# Patient Record
Sex: Male | Born: 1955 | Race: White | Hispanic: No | Marital: Married | Smoking: Never smoker
Health system: Southern US, Community
[De-identification: ages and names within clinical notes are randomized; demographics above are authoritative.]

## PROBLEM LIST (undated history)

## (undated) DIAGNOSIS — E669 Obesity, unspecified: Secondary | ICD-10-CM

## (undated) DIAGNOSIS — G2581 Restless legs syndrome: Secondary | ICD-10-CM

## (undated) DIAGNOSIS — I10 Essential (primary) hypertension: Secondary | ICD-10-CM

## (undated) DIAGNOSIS — E785 Hyperlipidemia, unspecified: Secondary | ICD-10-CM

## (undated) DIAGNOSIS — N200 Calculus of kidney: Secondary | ICD-10-CM

## (undated) DIAGNOSIS — E119 Type 2 diabetes mellitus without complications: Secondary | ICD-10-CM

## (undated) DIAGNOSIS — Z972 Presence of dental prosthetic device (complete) (partial): Secondary | ICD-10-CM

## (undated) HISTORY — PX: TONSILLECTOMY AND ADENOIDECTOMY: SHX28

## (undated) HISTORY — DX: Essential (primary) hypertension: I10

## (undated) HISTORY — DX: Restless legs syndrome: G25.81

## (undated) HISTORY — PX: OTHER SURGICAL HISTORY: SHX169

## (undated) HISTORY — DX: Type 2 diabetes mellitus without complications: E11.9

## (undated) HISTORY — DX: Obesity, unspecified: E66.9

---

## 2005-02-10 ENCOUNTER — Emergency Department: Payer: Self-pay | Admitting: Emergency Medicine

## 2005-02-22 ENCOUNTER — Emergency Department: Payer: Self-pay | Admitting: Emergency Medicine

## 2005-02-24 ENCOUNTER — Emergency Department: Payer: Self-pay | Admitting: Emergency Medicine

## 2005-08-01 ENCOUNTER — Emergency Department: Payer: Self-pay | Admitting: Emergency Medicine

## 2005-08-13 ENCOUNTER — Emergency Department: Payer: Self-pay | Admitting: Emergency Medicine

## 2005-10-05 ENCOUNTER — Emergency Department: Payer: Self-pay | Admitting: Internal Medicine

## 2007-06-10 ENCOUNTER — Ambulatory Visit: Payer: Self-pay | Admitting: Cardiology

## 2007-06-30 ENCOUNTER — Encounter: Payer: Self-pay | Admitting: Cardiology

## 2007-06-30 ENCOUNTER — Ambulatory Visit: Payer: Self-pay

## 2007-08-15 ENCOUNTER — Emergency Department: Payer: Self-pay | Admitting: Emergency Medicine

## 2009-11-02 ENCOUNTER — Encounter: Payer: Self-pay | Admitting: Cardiovascular Disease

## 2010-04-11 ENCOUNTER — Encounter: Payer: Self-pay | Admitting: Cardiovascular Disease

## 2010-04-18 ENCOUNTER — Encounter: Payer: Self-pay | Admitting: Cardiovascular Disease

## 2010-04-18 ENCOUNTER — Ambulatory Visit (INDEPENDENT_AMBULATORY_CARE_PROVIDER_SITE_OTHER): Payer: BC Managed Care – PPO | Admitting: Cardiovascular Disease

## 2010-04-18 DIAGNOSIS — E119 Type 2 diabetes mellitus without complications: Secondary | ICD-10-CM

## 2010-04-18 DIAGNOSIS — Z0000 Encounter for general adult medical examination without abnormal findings: Secondary | ICD-10-CM

## 2010-04-18 DIAGNOSIS — I10 Essential (primary) hypertension: Secondary | ICD-10-CM

## 2010-04-18 DIAGNOSIS — R079 Chest pain, unspecified: Secondary | ICD-10-CM | POA: Insufficient documentation

## 2010-04-18 NOTE — Progress Notes (Signed)
   Patient ID: Robert Griffith, male    DOB: 04-Jul-1955, 55 y.o.   MRN: [id number]  HPI Comments: Robert Griffith is a very pleasant 55 year old gentleman, patient of Dr. Annabell Howells, who has had previous workup for shortness of breath in the past with a stress echo, has a strong family history, recently diagnosed diabetes, who presents for evaluation of chest pain.  He reports that in the past several weeks, he has had 2 episodes of chest pain. He was mediastinal, lasting at least 5 minutes. It was sharp in nature. It eased off on its own without intervention. The first episode, he was going into the doctor for treatment of his right shoulder. The second episode occurred while he was walking.   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 He does have some mild shortness of breath which he attributes to being out of shape.  EKG shows normal sinus rhythm with rate 62 beats per minute with no significant ST or T wave changes.     Review of Systems  Constitutional: Negative.   HENT: Negative.   Eyes: Negative.   Respiratory: Positive for shortness of breath.   Cardiovascular: Positive for chest pain.  Gastrointestinal: Negative.   Musculoskeletal: Negative.   Skin: Negative.   Neurological: Negative.   Hematological: Negative.   Psychiatric/Behavioral: Negative.   All other systems reviewed and are negative.   BP 110/76  Pulse 62  Ht 5\' 8"  (1.727 m)  Wt 253 lb (114.76 kg)  BMI 38.47 kg/m2   Physical Exam  Nursing note and vitals reviewed. Constitutional: He is oriented to person, place, and time. He appears well-developed and well-nourished.  HENT:  Head: Normocephalic.  Nose: Nose normal.  Mouth/Throat: Oropharynx is clear and moist.  Eyes: Conjunctivae are normal. Pupils are equal, round, and reactive to light.  Neck: Normal range of motion. Neck supple. No JVD present.  Cardiovascular: Normal rate, regular rhythm, S1 normal, S2 normal, normal  heart sounds and intact distal pulses.  Exam reveals no gallop and no friction rub.   No murmur heard. Pulmonary/Chest: Effort normal and breath sounds normal. No respiratory distress. He has no wheezes. He has no rales. He exhibits no tenderness.  Abdominal: Soft. Bowel sounds are normal. He exhibits no distension. There is no tenderness.  Musculoskeletal: Normal range of motion. He exhibits no edema and no tenderness.  Lymphadenopathy:    He has no cervical adenopathy.  Neurological: He is alert and oriented to person, place, and time. Coordination normal.  Skin: Skin is warm and dry. No rash noted. No erythema.  Psychiatric: He has a normal mood and affect. His behavior is normal. Judgment and thought content normal.           Assessment and Plan

## 2010-04-18 NOTE — Assessment & Plan Note (Addendum)
His chest pain has some typical as well as atypical features. He does have several risk factors for coronary artery disease. We will set him up for a routine treadmill study at his convenience. Rest and contact our office if he has worsening chest pain symptoms.

## 2010-04-18 NOTE — Assessment & Plan Note (Signed)
Blood pressure is well controlled on today's visit. No changes made to the medications. 

## 2010-04-18 NOTE — Assessment & Plan Note (Signed)
We will try to obtain his most recent lipid panel for her system. Ideally, given his strong family history, we should be more aggressive with his cholesterol. He reports that his sugars have significantly improved on the new medication for diabetes

## 2010-04-18 NOTE — Patient Instructions (Addendum)
We will schedule your for a treadmill next Thursday 04/27/10 at 4:30 pm No medication changes were made today. Please call the office if you continue to have episodes of chest pain.     Exercise Test, Stress Test   An exercise stress test is a heart test (EKG) which is done while you are moving. You will walk on a treadmill. This test will tell your doctor how your heart does when it is forced to work harder and how much activity you can safely handle. A trained technician, a doctor, or physician assistant (PA) who specializes in heart disease will perform the test. WHAT SHOULD I WEAR?  Shorts or athletic pants.   Comfortable tennis shoes.   Women need to wear a bra that allows patches to be put on under it.  WHAT WILL HAPPEN DURING THE TEST?  An EKG cable will be attached to your waist. This cable is hooked up to patches, which look like round stickers stuck to your chest.   You will be asked to walk on the treadmill.   You will walk until you are too tired or until you are told to stop.  HOW LONG WILL THE TEST LAST?  It may last 30 minutes to 1 hour. The timing depends on your physical condition and the condition of your heart.   Tell the doctor or PA right away if you have:   Chest pain.   Leg cramps.      Shortness of breath.   Dizziness.     WHAT HAPPENS AFTER THE TEST?  You will rest for about 6 minutes. During this time, your technician will check your heart rhythm and blood pressure.   The testing equipment will be removed from your body and you can get dressed.   You may go home or back to your hospital room. You may keep doing all your usual activities as directed by your doctor.  FINDING OUT THE RESULTS OF YOUR TEST Ask your doctor when your test results will be ready. Make sure you follow up and get your test results. Document Released: 06/06/2007 Document Re-Released: 03/14/2009 [facility] Patient Information 2011 [place], [place].

## 2010-04-27 ENCOUNTER — Ambulatory Visit (INDEPENDENT_AMBULATORY_CARE_PROVIDER_SITE_OTHER): Payer: BC Managed Care – PPO | Admitting: Cardiovascular Disease

## 2010-04-27 DIAGNOSIS — R079 Chest pain, unspecified: Secondary | ICD-10-CM

## 2010-04-27 NOTE — Progress Notes (Signed)
Treadmill ordered for recent epsiodes of chest pain.  Resting EKG shows NSR with rate of 83 bpm, no significant ST or T wave changes Resting blood pressure of 110/60. Stand bruce protocal was used.  Patient exercised for 10 min. Peak heart rate of 163 bpm.  This was 99% of the maximum predicted heart rate (target heart rate 139). Achieved 12.9 METS No symptoms of chest pain or lightheadedness were reported at peak stress or in recovery.  Peak Blood pressure recorded was 140/80 Heart rate at 3 minutes in recovery was 93 bpm.  FINAL IMPRESSION: Normal exercise stress test. No significant EKG changes concerning for ischemia. Excellent exercise tolerance.

## 2010-05-16 NOTE — Assessment & Plan Note (Signed)
[facility] OFFICE NOTE   TEDDRICK, MALLARI                       MRN:          [id number]  DATE:06/10/2007                            DOB:          08-27-1955    The patient is a pleasant 55 year old gentleman with no prior cardiac  history who presents for evaluation of dyspnea.  He has had mild dyspnea  on exertion in the past, but over the past several months, he has  noticed it worsening.  This is predominantly when he is doing more  extreme activities.  It does not occur with routine activities around  the house.  Note, there is no orthopnea, PND, pedal edema, palpitations,  presyncope, syncope, or exertional chest pain.  Because of his dyspnea  and a family history of coronary disease, he has to be evaluated.   CURRENT MEDICATIONS:  At present include aspirin 325 mg p.o. daily and  benazepril HCl 40 mg p.o. daily.   ALLERGIES:  He has an allergy to IBUPROFEN.   SOCIAL HISTORY:  He does not smoke nor does he consume alcohol.   FAMILY HISTORY:  Positive for coronary artery disease.  His father died  of myocardial infarction in his 64s.  His mother has had coronary  disease, but it was at an older age (in the 79s).   PAST MEDICAL HISTORY:  Significant for hypertension and mild  hyperlipidemia.  There is no diabetes mellitus.  He does have a history  of obstructive sleep apnea.  There is no other medical history noted.  He has had previous surgery to remove his uvula for obstructive sleep  apnea as well as tonsillectomy, but no other surgeries were noted.   REVIEW OF SYSTEMS:  He denies any headaches, fevers, or chills.  There  is no productive cough or hemoptysis.  There is no dysphagia,  odynophagia, or melena.  He has had some hematochezia attributed to  hemorrhoids and Dr. Dossie Arbour is aware of this.  There is no dysuria or  hematuria.  There is no rash or seizure activity.  There is no  orthopnea,  PND, or pedal edema.  He does have some pain in his joints at  times.  The remainder of review of systems are negative.   PHYSICAL EXAMINATION:  VITAL SIGNS:  Today shows a blood pressure  146/85, and his pulse is 71, he was 250 pounds.  GENERAL:  He is well developed, well nourished, in no acute distress.  SKIN:  Warm and dry.  He is not appeared to be depressed.  There is no peripheral clubbing.  BACK:  Normal.  HEENT:  Normal with normal eyelids.  NECK:  Supple with normal upstroke bilaterally.  No bruits noted.  There  is no jugular venous distention.  No thyromegaly.  CHEST:  Clear to auscultation.  There is no expansion.  CARDIOVASCULAR:  Regular rhythm.  Normal S1 and S2.  There are no  murmur, rubs, or gallops noted.  ABDOMEN:  Nontender.  Positive bowel sounds.  No hepatomegaly.  No mass  appreciated.  There is no abdominal  bruit.  EXTREMITIES:  He has 2+ femoral pulses bilaterally.  No bruits.  Extremities show no edema that I could palpate.  No cords.  He has 2+  posterior tibial pulses bilaterally.  NEUROLOGIC:  Grossly intact.   I did have an electrocardiogram on May 29, 2007.  At that time, he had a  sinus rhythm.  There was no RV conduction delay, but there were no  significant ST changes.   DIAGNOSES:  1. Dyspnea on exertion - the etiology of this is unclear.      Electrocardiogram is nonspecific, and note, he is not having chest      pain.  Certainly, he has risk factors, as well as a strong family      history of coronary disease.  We will plan to risk stratify with a      stress echocardiogram.  If it shows no abnormalities, then we would      not pursue further ischemia evaluation.  Some of this may be      related to deconditioning, and we discussed the importance of an      exercise program if indeed his stress echocardiogram is normal.  2. Hypertension - his blood pressure is borderline today.  This can be      tracked and additional medications can be added  as indicated.  3. Risk factor modification - we discussed the importance of diet and      exercise.  Note, he does not smoke.  We also discussed importance      of weight loss.  4. Obstructive sleep apnea - managed per Dr. Dossie Arbour.  I encouraged      him to consider resuming his continuous positive airway pressure if      indeed there is a significant issue.   We will see him back on an as-needed basis, pending the results of his  stress echocardiogram.     Madolyn Frieze. Jens Som, MD, [facility]  Electronically Signed    BSC/MedQ  DD: 06/10/2007  DT: 06/11/2007  Job #: [id number]   cc:   Vonita Moss, MD

## 2010-06-21 ENCOUNTER — Encounter: Payer: Self-pay | Admitting: Cardiovascular Disease

## 2012-09-15 ENCOUNTER — Emergency Department: Payer: Self-pay | Admitting: Emergency Medicine

## 2012-09-15 LAB — COMPREHENSIVE METABOLIC PANEL
Albumin: 3.9 g/dL (ref 3.4–5.0)
Anion Gap: 3 — ABNORMAL LOW (ref 7–16)
BUN: 10 mg/dL (ref 7–18)
Bilirubin,Total: 0.6 mg/dL (ref 0.2–1.0)
Co2: 28 mmol/L (ref 21–32)
Creatinine: 0.89 mg/dL (ref 0.60–1.30)
EGFR (African American): >60
Glucose: 109 mg/dL — ABNORMAL HIGH (ref 65–99)
Potassium: 4.5 mmol/L (ref 3.5–5.1)

## 2012-09-15 LAB — CBC
MCHC: 34.6 g/dL (ref 32.0–36.0)
MCV: 88 fL (ref 80–100)
Platelet: 181 10*3/uL (ref 150–440)
RDW: 13.2 % (ref 11.5–14.5)
WBC: 8.9 10*3/uL (ref 3.8–10.6)

## 2012-09-16 ENCOUNTER — Inpatient Hospital Stay: Payer: Self-pay | Admitting: Podiatry

## 2012-09-16 LAB — BASIC METABOLIC PANEL
BUN: 9 mg/dL (ref 7–18)
Creatinine: 0.74 mg/dL (ref 0.60–1.30)
EGFR (African American): >60
EGFR (Non-African Amer.): >60
Potassium: 4.4 mmol/L (ref 3.5–5.1)
Sodium: 136 mmol/L (ref 136–145)

## 2012-09-17 LAB — BASIC METABOLIC PANEL
Anion Gap: 5 — ABNORMAL LOW (ref 7–16)
BUN: 11 mg/dL (ref 7–18)
Calcium, Total: 8.5 mg/dL (ref 8.5–10.1)
Chloride: 109 mmol/L — ABNORMAL HIGH (ref 98–107)
Co2: 26 mmol/L (ref 21–32)
Creatinine: 0.86 mg/dL (ref 0.60–1.30)
EGFR (African American): >60
EGFR (Non-African Amer.): >60
Potassium: 3.7 mmol/L (ref 3.5–5.1)
Sodium: 140 mmol/L (ref 136–145)

## 2012-09-17 LAB — CBC WITH DIFFERENTIAL/PLATELET
Basophil %: 1 %
Eosinophil #: 0.4 10*3/uL (ref 0.0–0.7)
Eosinophil %: 5.6 %
HCT: 42.1 % (ref 40.0–52.0)
HGB: 14.5 g/dL (ref 13.0–18.0)
Lymphocyte #: 2.5 10*3/uL (ref 1.0–3.6)
MCH: 30.6 pg (ref 26.0–34.0)
Neutrophil %: 47.8 %
WBC: 6.9 10*3/uL (ref 3.8–10.6)

## 2012-09-18 LAB — BASIC METABOLIC PANEL
Anion Gap: 5 — ABNORMAL LOW (ref 7–16)
Chloride: 106 mmol/L (ref 98–107)
Creatinine: 0.78 mg/dL (ref 0.60–1.30)
EGFR (African American): >60
EGFR (Non-African Amer.): >60
Potassium: 3.9 mmol/L (ref 3.5–5.1)
Sodium: 137 mmol/L (ref 136–145)

## 2012-09-20 LAB — WOUND CULTURE

## 2014-02-05 ENCOUNTER — Ambulatory Visit: Payer: Self-pay | Admitting: Gastroenterology

## 2014-04-23 NOTE — Op Note (Signed)
PATIENT NAME:  Robert Griffith, Robert Griffith MR#:  [id number] DATE OF BIRTH:  10-25-1955  DATE OF PROCEDURE:  09/16/2012  PREOPERATIVE DIAGNOSIS: Cellulitis abscess, dorsum of the left foot.   POSTOPERATIVE DIAGNOSIS: Cellulitis abscess, dorsum of the left foot.   PROCEDURE: Incision and drainage and debridement of abscess and infected tissue, left foot.   SURGEON: Albertine Patricia, DPM.  ASSISTANT: (Dictation Anomaly)<<MISSInoneG TEXT>> .  HISTORY OF PRESENT ILLNESS: The patient has had pain and discomfort to the left foot over the last 4 or 5 days. He had redness and cellulitis, went to the Emergency Room yesterday and was given a gram of vancomycin and (Dictation Anomaly)<<MreleasedISSING TEXT>> some oral Cipro prior to that.   Cultures in the Emergency Room were showing a gram-positive heavy growth. Uncertain if what that is is going to be a MRSA or not, but there is a possibility.   I saw him in the office today and felt like he needed debridement and I and D of that region in order to prevent further worsening and damage, so admitted to the hospital for that debridement today and also will go ahead and keep him on some vancomycin till this starts to look a lot better.   ANESTHESIA: LMA with local block.   ANESTHESIOLOGIST: Dr. Andree Elk.   ESTIMATED BLOOD LOSS: 10 mL.   HEMOSTASIS: None.   DESCRIPTION OF PROCEDURE: The patient was brought to the OR and placed on the OR  table in the supine position. At this point, after general anesthesia was achieved, the patient was then prepped and draped in the usual sterile manner. At this time, attention was directed to the dorsum of the left foot. There is an abscess at the top of the 1st metatarsal head.   I incised this region with a sharp 15-blade approximately 1 cm proximal and 1 cm distal to the actual wound itself which is about a 1 cm in diameter abscess raised area. There was infected tissue in the region, some purulent drainage. This was cultured  again to make sure we got a good deep culture.   At this point, a Versajet was used to remove all infected soft tissue, skin and subcutaneous tissue as well. The area was probed. There was no sinus tracking going into the tendon sheath or the joint capsule at this point. Minimal deviation medial and lateral as far as tracking is concerned.   The area was copiously irrigated again and a Versajet was used to clean up some damaged tissue. There was a defect approximately 1 cm in diameter once we were done with debridement to the region.   The area was then copiously irrigated once again and 2 simple interrupted sutures were placed proximally and 1 distally, and the central portion of the wound was left open for packing. This was accomplished with wet-to-dry saline gauze, and a sterile compressive wrap was placed across the area consisting of 4 x 4's, Conform, Kling, and an Ace wrap. The patient tolerated the procedure and anesthesia well and left the OR for the recovery room with vital signs stable and neurovascular status intact.    ____________________________ Gerrit Heck. Maurion Walkowiak, DPM mgt:np D: 09/16/2012 18:29:00 ET T: 09/16/2012 21:25:03 ET JOB#: [id number]  cc: Gerrit Heck Iverna Hammac, DPM, <Dictator> Perry Mount MD ELECTRONICALLY SIGNED 10/21/2012 16:55

## 2014-04-23 NOTE — Consult Note (Signed)
PATIENT NAME:  Robert Griffith, Robert Griffith MR#:  [id number] DATE OF BIRTH:  1955/01/15  DATE OF CONSULTATION:  09/16/2012  CONSULTING PHYSICIAN:  Janet Humphreys S. Manuella Ghazi, MD  PRIMARY CARE PHYSICIAN: Dr. Golden Pop.   REQUESTING PHYSICIAN: Dr. Albertine Patricia.  REASON FOR CONSULTATION: Preop medical clearance.   HISTORY OF PRESENT ILLNESS: The patient is a 59 year old male with a known history of hypertension, diabetes, was seen in consultation for preop medical clearance for a left foot cellulitis likely requiring I and D and debridement. The patient was seen in the Emergency Department yesterday for the same, and was discharged home on oral clindamycin, with outpatient follow-up with Dr. Elvina Mattes, which was today, and Dr. Elvina Mattes him as a direct-admit for a possible debridement and I and D.   The patient denies having any issues with blood pressure or diabetes. His sugars have been well-controlled with diet only. He does not recall his last hemoglobin A1c. Has been following with Dr. Jeananne Rama very regularly.  PAST MEDICAL HISTORY:  1.  Hypertension.  2.  Diabetes.   ALLERGIES: TO IBUPROFEN AND DURACT.  MEDICATIONS AT HOME: Benazepril 40 mg p.o. daily, gabapentin 300 mg p.o. 4 times a day, clindamycin 300 mg p.o. every 6 hours which was prescribed yesterday in the Emergency Department for a total of 10 days.   PAST SURGICAL HISTORY: None.   SOCIAL HISTORY: No smoking, alcohol or IV drugs of abuse. He works as a Air traffic controller at [employer].   FAMILY HISTORY: Father had heart disease.   REVIEW OF SYSTEMS: CONSTITUTIONAL: No fever, fatigue, weakness.  EYES: No blurred or double vision.  ENT: No tinnitus or ear pain.  RESPIRATORY: No cough, wheezing, hemoptysis.  CARDIOVASCULAR: No chest pain, orthopnea, or edema.  GASTROINTESTINAL: No nausea, vomiting, or diarrhea. GENITOURINARY: No dysuria or hematuria.  ENDOCRINE: No polyuria or nocturia.  HEMATOLOGIC: No anemia or easy  bruising.  SKIN: He has a left lower extremity cellulitis.  NEUROLOGIC: No tingling, numbness, weakness.  PSYCHIATRIC: No history of anxiety or depression.   PHYSICAL EXAMINATION: VITAL SIGNS: Temperature 97.3, heart rate 61 per minute, respirations 18 per minute, blood pressure 113/65 mm hg & was saturating 99% on room air.   GENERAL IMPRESSIONS: The patient is a 59 year old male lying in bed comfortably, without any acute distress.  EYES: Pupils equal, round, reactive to light and accommodation. No scleral icterus. Extraocular muscles intact.  HEENT: Head atraumatic. Oropharynx and nasopharynx clear.  NECK: No jugular venous distention. No thyromegaly.  LUNGS: Clear to auscultation bilaterally. No wheezing, rales, rhonchi or crepitation.  CARDIOVASCULAR: S1, S2 normal. No murmurs, rubs or gallops.  ABDOMEN: Soft, nontender, nondistended. Bowel sounds present. No organomegaly or mass.  EXTREMITIES: He has cellulitis on his left lower extremity around great his toe. Currently it is dressed up in a dressing. No cyanosis or clubbing. No pedal edema.  NEUROLOGIC: Cranial nerves II-XII intact. Muscle strength 5/5 in all extremities. Sensation intact. The patient is alert and oriented x 3.  SKIN: As mentioned above.   LABORATORY PANEL: Normal BMP. Normal CBC. Wound culture from yesterday has been growing heavy growth, unidentified organism as yet.   EKG showed sinus bradycardia. No ST-wave changes. Rate is 57 to 58 per minute.   1.  The patient is medically cleared for planned surgery. He will be at mild risk for planned surgery considering his diabetes and hypertension.  2.  Diabetes mellitus, diet-controlled. Check hemoglobin A1c. If needed consider sliding-scale insulin. At this time I do not  see any need for sliding-scale insulin need as his sugar has been running in the 80s to 90s. will just recheck BMP in am which should be enough. 3.  Hypertension: Will start him on benazepril, which is  his old medication, monitor his blood pressure.  4.  Neuropathy. We will resume his gabapentin at his home dose.  5.  Left foot cellulitis, likely requiring incision and drainage and debridement. Further management per podiatry.   Total time taking care of this patient: Forty-five minutes.   CODE STATUS: FULL CODE.    ____________________________ Inna Tisdell S. Manuella Ghazi, MD vss:dm D: 09/16/2012 14:42:17 ET T: 09/16/2012 15:14:57 ET JOB#: [id number]  cc: Augustus Zurawski S. Manuella Ghazi, MD, <Dictator> Guadalupe Maple, MD Gerrit Heck Troxler, DPM Remer Macho MD ELECTRONICALLY SIGNED 09/17/2012 15:02

## 2014-04-23 NOTE — Discharge Summary (Signed)
Dates of Admission and Diagnosis:  Date of Admission 16-Sep-2012   Date of Discharge 18-Sep-2012   Admitting Diagnosis cellulitis with abcess left foot, diabetes   Final Diagnosis same   Discharge Diagnosis 1 cellulitis with abcess left foot   2 diabetes   3 diabetic neuropathy    Chief Complaint/History of Present Illness Pt admitted due to redness and cellulitis left jfoot.  Was seen in ER 9-16 given vanc iv there and sent home with cleocin.  Seen in my office 9-17 abd I felt he needed surgical debridement and further iv vancomycin.     Routine Micro:  16-Sep-14 18:00   Micro Text Report WOUND AER/ANAEROBIC CULT   COMMENT                   COLONIES TOO SMALL TO READ   GRAM STAIN                FEW WHITE BLOOD CELLS   GRAM STAIN                MANY RED BLOOD CELLS   GRAM STAIN                FEW GRAM POSITIVE COCCI IN PAIRS   ANTIBIOTIC                       Specimen Source left foot wound  Culture Comment COLONIES TOO SMALL TO READ  Gram Stain 1 FEW WHITE BLOOD CELLS  Gram Stain 2 MANY RED BLOOD CELLS  Gram Stain 3 FEW GRAM POSITIVE COCCI IN PAIRS  Result(s) reported on 17 Sep 2012 at 04:09PM.  Routine Chem:  16-Sep-14 13:12   Glucose, Serum 98  BUN 9  Creatinine (comp) 0.74    15:16   Hemoglobin A1c ([facility]) 5.6 (The American Diabetes Association recommends that a primary goal of therapy should be <7% and that physicians should reevaluate the treatment regimen in patients with HbA1c values consistently >8%.)  17-Sep-14 03:55   Glucose, Serum  102  BUN 11  Creatinine (comp) 0.86  Routine Hem:  17-Sep-14 03:55   WBC (CBC) 6.9   Hospital Course:  Hospital Course underwent debridement I&D left foot abcess infected soft tissue.  jDid not involve tendon or significant tracking.  Has 1cm diameter jwound due to skin necrosis but has good blood supply and granulation. Tolerated procedure and anesthia well   Condition on Discharge Good   DISCHARGE INSTRUCTIONS HOME  MEDS:  Medication Reconciliation: Patient's Home Medications at Discharge:     Medication Instructions  gabapentin 300 mg oral capsule  1 cap(s) orally 4 times a day   benazepril 40 mg oral tablet  1 tab(s) orally once a day   cleocin hcl 300 mg capsule  1 cap(s) orally every 6 hours x 10 days   acetaminophen-oxycodone 325 mg-5 mg tablet  1-2 tab(s) orally every 4-6 hours- as needed    benazepril 40 mg oral tablet  1 tab(s) orally once a day   gabapentin 300 mg oral capsule  1 cap(s) orally 4 times a day   bactro  Apply topically to affected area once a day     Physician's Instructions:  Home Health? Yes   Home Health Service Nurse  dressing changes    Home Health Instructions wet to dry dressinges to left ffot ulcer  with bactroban ointment qd   Dressing Care as above    Diet Carbohydrate Controlled (ADA) Diet  Activity Limitations bedrest    Time frame for Follow Up Appointment 1-2 weeks  Tuesday23    Electronic Signatures: Perry Mount (MD)  (Signed 17-Sep-14 17:44)  Authored: ADMISSION DATE AND DIAGNOSIS, CHIEF COMPLAINT/HPI, PERTINENT [facility], PATIENT INSTRUCTIONS   Last Updated: 17-Sep-14 17:44 by Perry Mount (MD)

## 2014-04-23 NOTE — Consult Note (Signed)
Brief Consult Note: Diagnosis: medical management, preop clearance.   Patient was seen by consultant.   Consult note dictated.   Recommend to proceed with surgery or procedure.   Orders entered.   Discussed with Attending MD.   Comments: 1. preop medical clearance: medically clear for planned surgery, at mild risk.   2. diabetes mellitus: diet controlled, check hba1c.  3. hypertension: start benazepril, monitor  4. neuropathy: resume gabapentin  5. lt foot cellulitis: likley requiring I and D - debridement, furthur management per podiatry.  Electronic Signatures: Remer Macho (MD)  (Signed 16-Sep-14 14:36)  Authored: Brief Consult Note   Last Updated: 16-Sep-14 14:36 by Remer Macho (MD)

## 2014-06-28 DIAGNOSIS — E1169 Type 2 diabetes mellitus with other specified complication: Secondary | ICD-10-CM | POA: Insufficient documentation

## 2014-06-28 DIAGNOSIS — I10 Essential (primary) hypertension: Secondary | ICD-10-CM | POA: Insufficient documentation

## 2014-06-28 DIAGNOSIS — E119 Type 2 diabetes mellitus without complications: Secondary | ICD-10-CM

## 2014-06-29 ENCOUNTER — Encounter: Payer: Self-pay | Admitting: Family Medicine

## 2014-06-29 ENCOUNTER — Ambulatory Visit (INDEPENDENT_AMBULATORY_CARE_PROVIDER_SITE_OTHER): Payer: BC Managed Care – PPO | Admitting: Family Medicine

## 2014-06-29 VITALS — BP 118/74 | HR 80 | Temp 98.1°F | Ht 68.4 in | Wt 255.0 lb

## 2014-06-29 DIAGNOSIS — E1140 Type 2 diabetes mellitus with diabetic neuropathy, unspecified: Secondary | ICD-10-CM

## 2014-06-29 DIAGNOSIS — L918 Other hypertrophic disorders of the skin: Secondary | ICD-10-CM | POA: Diagnosis not present

## 2014-06-29 DIAGNOSIS — E119 Type 2 diabetes mellitus without complications: Secondary | ICD-10-CM

## 2014-06-29 DIAGNOSIS — I10 Essential (primary) hypertension: Secondary | ICD-10-CM | POA: Diagnosis not present

## 2014-06-29 LAB — LP+ALT+AST PICCOLO, WAIVED
ALT (SGPT) PICCOLO, WAIVED: 27 U/L (ref 10–47)
AST (SGOT) PICCOLO, WAIVED: 28 U/L (ref 11–38)
Chol/HDL Ratio Piccolo,Waive: 4.9 mg/dL
Cholesterol Piccolo, Waived: 195 mg/dL (ref <200)
HDL Chol Piccolo, Waived: 39 mg/dL — ABNORMAL LOW (ref >59)
LDL CHOL CALC PICCOLO WAIVED: 100 mg/dL — AB (ref <100)
Triglycerides Piccolo,Waived: 276 mg/dL — ABNORMAL HIGH (ref <150)
VLDL Chol Calc Piccolo,Waive: 55 mg/dL — ABNORMAL HIGH (ref <30)

## 2014-06-29 LAB — BAYER DCA HB A1C WAIVED: HB A1C (BAYER DCA - WAIVED): 6 % (ref <7.0)

## 2014-06-29 MED ORDER — GABAPENTIN 300 MG PO CAPS: 300.0000 mg | ORAL_CAPSULE | Freq: Four times a day (QID) | ORAL | Status: DC

## 2014-06-29 MED ORDER — BENAZEPRIL HCL 40 MG PO TABS: 40.0000 mg | ORAL_TABLET | Freq: Every day | ORAL | Status: DC

## 2014-06-29 NOTE — Assessment & Plan Note (Signed)
The current medical regimen is effective;  continue present plan and medications.  

## 2014-06-29 NOTE — Progress Notes (Signed)
   BP 118/74 mmHg  Pulse 80  Temp(Src) 98.1 F (36.7 C)  Ht 5' 8.4" (1.737 m)  Wt 255 lb (115.667 kg)  BMI 38.34 kg/m2  SpO2 96%   Subjective:    Patient ID: Robert Griffith, male    DOB: [birth year], 59 y.o.   MRN: [id number]  HPI: Robert Griffith is a 59 y.o. male  Chief Complaint  Patient presents with  . Diabetes  . Hypertension   BP doing well  Diet controled DM  DPN stable Also back from beach and sjin tags in GU area inflamed and sore with bleeding Destroyed with cryo Feet some callus changes otherwise OK Relevant past medical, surgical, family and social history reviewed and updated as indicated. Interim medical history since our last visit reviewed. Allergies and medications reviewed and updated.  Review of Systems  Constitutional: Negative.   Respiratory: Negative.   Cardiovascular: Negative.     Per HPI unless specifically indicated above     Objective:    BP 118/74 mmHg  Pulse 80  Temp(Src) 98.1 F (36.7 C)  Ht 5' 8.4" (1.737 m)  Wt 255 lb (115.667 kg)  BMI 38.34 kg/m2  SpO2 96%  Wt Readings from Last 3 Encounters:  06/29/14 255 lb (115.667 kg)  01/19/14 244 lb (110.678 kg)  04/27/10 242 lb (109.77 kg)    Physical Exam  Musculoskeletal:  Feet normal exam with callous         Assessment & Plan:   Problem List Items Addressed This Visit      Cardiovascular and Mediastinum   HTN (hypertension)    The current medical regimen is effective;  continue present plan and medications.       Relevant Medications   benazepril (LOTENSIN) 40 MG tablet     Endocrine   Diabetes mellitus without complication    The current medical regimen is effective;  continue present plan and medications.       Relevant Medications   benazepril (LOTENSIN) 40 MG tablet    Other Visit Diagnoses    Essential hypertension, benign    -  Primary    Relevant Medications    benazepril (LOTENSIN) 40 MG tablet    Other Relevant Orders    Basic metabolic  panel    Bayer DCA Hb A1c Waived    LP+ALT+AST Piccolo, Waived    Type 2 diabetes mellitus with diabetic neuropathy        Relevant Medications    benazepril (LOTENSIN) 40 MG tablet    gabapentin (NEURONTIN) 300 MG capsule    Other Relevant Orders    Basic metabolic panel    Bayer DCA Hb A1c Waived    LP+ALT+AST Piccolo, Waived    Cutaneous skin tags        Also back from beach and sjin tags in GU area inflamed and sore with bleeding Destroyed with cryo 20+ skin tags        Follow up plan: Return in about 6 months (around 12/29/2014) for Physical Exam pe labs and a1c.

## 2014-06-30 LAB — BASIC METABOLIC PANEL
BUN / CREAT RATIO: 14 (ref 10–22)
BUN: 12 mg/dL (ref 8–27)
CO2: 23 mmol/L (ref 18–29)
Calcium: 9.5 mg/dL (ref 8.6–10.2)
Chloride: 100 mmol/L (ref 97–108)
Creatinine, Ser: 0.86 mg/dL (ref 0.76–1.27)
GFR calc non Af Amer: 94 mL/min/{1.73_m2} (ref >59)
GFR, EST AFRICAN AMERICAN: 109 mL/min/{1.73_m2} (ref >59)
Glucose: 122 mg/dL — ABNORMAL HIGH (ref 65–99)
Potassium: 4.7 mmol/L (ref 3.5–5.2)
SODIUM: 140 mmol/L (ref 134–144)

## 2014-07-28 ENCOUNTER — Telehealth: Payer: Self-pay

## 2014-07-28 NOTE — Telephone Encounter (Signed)
Patient requesting Contour Test Strips - tests daily [facility]

## 2014-07-29 ENCOUNTER — Other Ambulatory Visit: Payer: Self-pay | Admitting: Family Medicine

## 2014-07-29 DIAGNOSIS — E119 Type 2 diabetes mellitus without complications: Secondary | ICD-10-CM

## 2014-07-29 MED ORDER — GLUCOSE BLOOD VI STRP: ORAL_STRIP | Status: DC

## 2015-01-24 ENCOUNTER — Encounter: Payer: BC Managed Care – PPO | Admitting: Family Medicine

## 2015-02-01 ENCOUNTER — Other Ambulatory Visit: Payer: Self-pay | Admitting: Family Medicine

## 2015-02-01 ENCOUNTER — Encounter: Payer: Self-pay | Admitting: Family Medicine

## 2015-02-01 ENCOUNTER — Ambulatory Visit (INDEPENDENT_AMBULATORY_CARE_PROVIDER_SITE_OTHER): Payer: BC Managed Care – PPO | Admitting: Family Medicine

## 2015-02-01 VITALS — BP 138/70 | HR 81 | Temp 98.6°F | Ht 68.5 in | Wt 258.0 lb

## 2015-02-01 DIAGNOSIS — Z113 Encounter for screening for infections with a predominantly sexual mode of transmission: Secondary | ICD-10-CM

## 2015-02-01 DIAGNOSIS — Z0000 Encounter for general adult medical examination without abnormal findings: Secondary | ICD-10-CM | POA: Diagnosis not present

## 2015-02-01 DIAGNOSIS — E119 Type 2 diabetes mellitus without complications: Secondary | ICD-10-CM | POA: Diagnosis not present

## 2015-02-01 DIAGNOSIS — I10 Essential (primary) hypertension: Secondary | ICD-10-CM | POA: Diagnosis not present

## 2015-02-01 DIAGNOSIS — G2581 Restless legs syndrome: Secondary | ICD-10-CM | POA: Diagnosis not present

## 2015-02-01 LAB — URINALYSIS, ROUTINE W REFLEX MICROSCOPIC
BILIRUBIN UA: NEGATIVE
Glucose, UA: NEGATIVE
Ketones, UA: NEGATIVE
LEUKOCYTES UA: NEGATIVE
Nitrite, UA: NEGATIVE
PH UA: 7 (ref 5.0–7.5)
Protein, UA: NEGATIVE
RBC UA: NEGATIVE
SPEC GRAV UA: 1.015 (ref 1.005–1.030)
Urobilinogen, Ur: 0.2 mg/dL (ref 0.2–1.0)

## 2015-02-01 LAB — BAYER DCA HB A1C WAIVED: HB A1C (BAYER DCA - WAIVED): 6.2 % (ref <7.0)

## 2015-02-01 NOTE — Assessment & Plan Note (Signed)
Discuss hemoglobin A1c okay today at 6.2 but without weight loss don't anticipate that lasting much longer

## 2015-02-01 NOTE — Assessment & Plan Note (Signed)
The current medical regimen is effective;  continue present plan and medications.  

## 2015-02-01 NOTE — Progress Notes (Signed)
BP 138/70 mmHg  Pulse 81  Temp(Src) 98.6 F (37 C)  Ht 5' 8.5" (1.74 m)  Wt 258 lb (117.028 kg)  BMI 38.65 kg/m2   Subjective:    Patient ID: Robert Griffith, male    DOB: [birth year], 60 y.o.   MRN: [id number]  HPI: Robert Griffith is a 60 y.o. male  Chief Complaint  Patient presents with  . Annual Exam  . Diabetes    doing well with his medications taking Benzapril gabapentin without problems side effects takes faithfully blood pressure well controlled restless legs well controlled  diabetes diet controlled with weight gain blood sugars been a little high in the 130s area Patient not taking metformin was taking at first but stopped Relevant past medical, surgical, family and social history reviewed and updated as indicated. Interim medical history since our last visit reviewed. Allergies and medications reviewed and updated.  Review of Systems  Constitutional: Negative.   HENT: Negative.   Eyes: Negative.   Respiratory: Negative.   Cardiovascular: Negative.   Gastrointestinal: Negative.   Endocrine: Negative.   Genitourinary: Negative.   Musculoskeletal: Negative.   Skin: Negative.   Allergic/Immunologic: Negative.   Neurological: Negative.   Hematological: Negative.   Psychiatric/Behavioral: Negative.     Per HPI unless specifically indicated above     Objective:    BP 138/70 mmHg  Pulse 81  Temp(Src) 98.6 F (37 C)  Ht 5' 8.5" (1.74 m)  Wt 258 lb (117.028 kg)  BMI 38.65 kg/m2  Wt Readings from Last 3 Encounters:  02/01/15 258 lb (117.028 kg)  06/29/14 255 lb (115.667 kg)  01/19/14 244 lb (110.678 kg)    Physical Exam  Constitutional: He is oriented to person, place, and time. He appears well-developed and well-nourished.  HENT:  Head: Normocephalic and atraumatic.  Right Ear: External ear normal.  Left Ear: External ear normal.  Eyes: Conjunctivae and EOM are normal. Pupils are equal, round, and reactive to light.  Neck: Normal range of  motion. Neck supple.  Cardiovascular: Normal rate, regular rhythm, normal heart sounds and intact distal pulses.   Pulmonary/Chest: Effort normal and breath sounds normal.  Abdominal: Soft. Bowel sounds are normal. There is no splenomegaly or hepatomegaly.  Genitourinary: Rectum normal, prostate normal and penis normal.  Musculoskeletal: Normal range of motion.  Neurological: He is alert and oriented to person, place, and time. He has normal reflexes.  Skin: No rash noted. No erythema.  Psychiatric: He has a normal mood and affect. His behavior is normal. Judgment and thought content normal.    Results for orders placed or performed in visit on [id number]  Basic metabolic panel  Result Value Ref Range   Glucose 122 (H) 65 - 99 mg/dL   BUN 12 8 - 27 mg/dL   Creatinine, Ser 0.86 0.76 - 1.27 mg/dL   GFR calc non Af Amer 94 >59 mL/min/1.73   GFR calc Af Amer 109 >59 mL/min/1.73   BUN/Creatinine Ratio 14 10 - 22   Sodium 140 134 - 144 mmol/L   Potassium 4.7 3.5 - 5.2 mmol/L   Chloride 100 97 - 108 mmol/L   CO2 23 18 - 29 mmol/L   Calcium 9.5 8.6 - 10.2 mg/dL  Bayer DCA Hb A1c Waived  Result Value Ref Range   Bayer DCA Hb A1c Waived 6.0 <7.0 %  LP+ALT+AST Piccolo, Waived  Result Value Ref Range   ALT (SGPT) Piccolo, Waived 27 10 - 47 U/L   AST (SGOT) Piccolo,  Waived 28 11 - 38 U/L   Cholesterol Piccolo, Waived 195 <200 mg/dL   HDL Chol Piccolo, Waived 39 (L) >59 mg/dL   Triglycerides Piccolo,Waived 276 (H) <150 mg/dL   Chol/HDL Ratio Piccolo,Waive 4.9 mg/dL   LDL Chol Calc Piccolo Waived 100 (H) <100 mg/dL   VLDL Chol Calc Piccolo,Waive 55 (H) <30 mg/dL      Assessment & Plan:   Problem List Items Addressed This Visit      Cardiovascular and Mediastinum   Essential hypertension    The current medical regimen is effective;  continue present plan and medications.         Endocrine   Diabetes mellitus without complication ([place])    Discuss hemoglobin A1c okay today at 6.2 but  without weight loss don't anticipate that lasting much longer      Relevant Orders   Bayer DCA Hb A1c Waived     Other   Restless legs    The current medical regimen is effective;  continue present plan and medications.        Other Visit Diagnoses    Routine general medical examination at a health care facility    -  Primary    Relevant Orders    CBC with Differential/Platelet    Comprehensive metabolic panel    Lipid Panel w/o Chol/HDL Ratio    PSA    TSH    Urinalysis, Routine w reflex microscopic (not at [facility])    Routine screening for STI (sexually transmitted infection)        Relevant Orders    Hepatitis C Antibody        Follow up plan: Return in about 6 months (around 08/01/2015) for A1c and recheck medications BMP.

## 2015-02-02 ENCOUNTER — Encounter: Payer: Self-pay | Admitting: Family Medicine

## 2015-02-02 LAB — COMPREHENSIVE METABOLIC PANEL
ALBUMIN: 4.2 g/dL (ref 3.6–4.8)
ALT: 30 IU/L (ref 0–44)
AST: 29 IU/L (ref 0–40)
Albumin/Globulin Ratio: 1.6 (ref 1.1–2.5)
Alkaline Phosphatase: 71 IU/L (ref 39–117)
BUN / CREAT RATIO: 21 (ref 10–22)
BUN: 19 mg/dL (ref 8–27)
Bilirubin Total: 0.3 mg/dL (ref 0.0–1.2)
CO2: 22 mmol/L (ref 18–29)
CREATININE: 0.92 mg/dL (ref 0.76–1.27)
Calcium: 9.2 mg/dL (ref 8.6–10.2)
Chloride: 98 mmol/L (ref 96–106)
GFR calc non Af Amer: 89 mL/min/{1.73_m2} (ref >59)
GFR, EST AFRICAN AMERICAN: 103 mL/min/{1.73_m2} (ref >59)
GLUCOSE: 97 mg/dL (ref 65–99)
Globulin, Total: 2.6 g/dL (ref 1.5–4.5)
Potassium: 4.4 mmol/L (ref 3.5–5.2)
Sodium: 136 mmol/L (ref 134–144)
TOTAL PROTEIN: 6.8 g/dL (ref 6.0–8.5)

## 2015-02-02 LAB — CBC WITH DIFFERENTIAL/PLATELET
BASOS ABS: 0 10*3/uL (ref 0.0–0.2)
Basos: 1 %
EOS (ABSOLUTE): 0.4 10*3/uL (ref 0.0–0.4)
Eos: 5 %
Hematocrit: 42 % (ref 37.5–51.0)
Hemoglobin: 14.2 g/dL (ref 12.6–17.7)
IMMATURE GRANS (ABS): 0 10*3/uL (ref 0.0–0.1)
IMMATURE GRANULOCYTES: 0 %
LYMPHS: 34 %
Lymphocytes Absolute: 2.5 10*3/uL (ref 0.7–3.1)
MCH: 28.9 pg (ref 26.6–33.0)
MCHC: 33.8 g/dL (ref 31.5–35.7)
MCV: 86 fL (ref 79–97)
MONOS ABS: 0.7 10*3/uL (ref 0.1–0.9)
Monocytes: 10 %
NEUTROS PCT: 50 %
Neutrophils Absolute: 3.8 10*3/uL (ref 1.4–7.0)
PLATELETS: 200 10*3/uL (ref 150–379)
RBC: 4.91 x10E6/uL (ref 4.14–5.80)
RDW: 14.5 % (ref 12.3–15.4)
WBC: 7.5 10*3/uL (ref 3.4–10.8)

## 2015-02-02 LAB — LIPID PANEL W/O CHOL/HDL RATIO
Cholesterol, Total: 186 mg/dL (ref 100–199)
HDL: 35 mg/dL — ABNORMAL LOW (ref >39)
LDL CALC: 106 mg/dL — AB (ref 0–99)
Triglycerides: 226 mg/dL — ABNORMAL HIGH (ref 0–149)
VLDL CHOLESTEROL CAL: 45 mg/dL — AB (ref 5–40)

## 2015-02-02 LAB — HEPATITIS C ANTIBODY

## 2015-02-02 LAB — PSA: PROSTATE SPECIFIC AG, SERUM: 1.3 ng/mL (ref 0.0–4.0)

## 2015-02-02 LAB — TSH: TSH: 2.8 u[IU]/mL (ref 0.450–4.500)

## 2015-02-08 ENCOUNTER — Telehealth: Payer: Self-pay

## 2015-02-08 NOTE — Telephone Encounter (Signed)
[facility]  One Touch Ultra Test Strips Test Once Daily

## 2015-02-09 ENCOUNTER — Other Ambulatory Visit: Payer: Self-pay | Admitting: Family Medicine

## 2015-02-09 MED ORDER — GLUCOSE BLOOD VI STRP: ORAL_STRIP | Status: DC

## 2015-03-03 ENCOUNTER — Other Ambulatory Visit: Payer: Self-pay | Admitting: Family Medicine

## 2015-06-16 LAB — HM DIABETES EYE EXAM

## 2015-08-02 ENCOUNTER — Encounter: Payer: Self-pay | Admitting: Family Medicine

## 2015-08-02 ENCOUNTER — Ambulatory Visit (INDEPENDENT_AMBULATORY_CARE_PROVIDER_SITE_OTHER): Payer: BC Managed Care – PPO | Admitting: Family Medicine

## 2015-08-02 VITALS — BP 115/71 | HR 70 | Temp 98.6°F | Ht 68.0 in | Wt 251.0 lb

## 2015-08-02 DIAGNOSIS — Z6838 Body mass index (BMI) 38.0-38.9, adult: Secondary | ICD-10-CM | POA: Diagnosis not present

## 2015-08-02 DIAGNOSIS — E119 Type 2 diabetes mellitus without complications: Secondary | ICD-10-CM | POA: Diagnosis not present

## 2015-08-02 DIAGNOSIS — G2581 Restless legs syndrome: Secondary | ICD-10-CM | POA: Diagnosis not present

## 2015-08-02 DIAGNOSIS — I10 Essential (primary) hypertension: Secondary | ICD-10-CM

## 2015-08-02 DIAGNOSIS — Z6836 Body mass index (BMI) 36.0-36.9, adult: Secondary | ICD-10-CM | POA: Insufficient documentation

## 2015-08-02 LAB — BAYER DCA HB A1C WAIVED: HB A1C: 6.1 % (ref <7.0)

## 2015-08-02 MED ORDER — BENAZEPRIL HCL 40 MG PO TABS: 40.0000 mg | ORAL_TABLET | Freq: Every day | ORAL | 6 refills | Status: DC

## 2015-08-02 MED ORDER — GABAPENTIN 300 MG PO CAPS: 300.0000 mg | ORAL_CAPSULE | Freq: Four times a day (QID) | ORAL | 6 refills | Status: DC

## 2015-08-02 MED ORDER — SILDENAFIL CITRATE 20 MG PO TABS: 20.0000 mg | ORAL_TABLET | Freq: Every day | ORAL | 12 refills | Status: DC | PRN

## 2015-08-02 NOTE — Addendum Note (Signed)
Addended byGolden Pop on: 08/02/2015 03:53 PM   Modules accepted: Orders

## 2015-08-02 NOTE — Assessment & Plan Note (Signed)
The current medical regimen is effective;  continue present plan and medications.  

## 2015-08-02 NOTE — Assessment & Plan Note (Signed)
Continuing diet exercise nutrition

## 2015-08-02 NOTE — Progress Notes (Signed)
BP 115/71 (BP Location: Left Arm, Patient Position: Sitting, Cuff Size: Normal)   Pulse 70   Temp 98.6 F (37 C)   Ht 5\' 8"  (1.727 m)   Wt 251 lb (113.9 kg)   SpO2 97%   BMI 38.16 kg/m    Subjective:    Patient ID: Robert Griffith, male    DOB: September 25, 1955, 60 y.o.   MRN: [id number]  HPI: Robert Griffith is a 60 y.o. male  Chief Complaint  Patient presents with  . Diabetes  . Hypertension  She'll recheck diet-controlled diabetes patient's lost some weight A1c remains good not taking any medications for now. Blood pressure remains good complaints from medications. Takes medications faithfully Restless legs doing well with gabapentin oh complaints.  Relevant past medical, surgical, family and social history reviewed and updated as indicated. Interim medical history since our last visit reviewed. Allergies and medications reviewed and updated.  Review of Systems  Constitutional: Negative.   Respiratory: Negative.   Cardiovascular: Negative.     Per HPI unless specifically indicated above     Objective:    BP 115/71 (BP Location: Left Arm, Patient Position: Sitting, Cuff Size: Normal)   Pulse 70   Temp 98.6 F (37 C)   Ht 5\' 8"  (1.727 m)   Wt 251 lb (113.9 kg)   SpO2 97%   BMI 38.16 kg/m   Wt Readings from Last 3 Encounters:  08/02/15 251 lb (113.9 kg)  02/01/15 258 lb (117 kg)  06/29/14 255 lb (115.7 kg)    Physical Exam  Constitutional: He is oriented to person, place, and time. He appears well-developed and well-nourished. No distress.  HENT:  Head: Normocephalic and atraumatic.  Right Ear: Hearing normal.  Left Ear: Hearing normal.  Nose: Nose normal.  Eyes: Conjunctivae and lids are normal. Right eye exhibits no discharge. Left eye exhibits no discharge. No scleral icterus.  Cardiovascular: Normal rate, regular rhythm and normal heart sounds.   Pulmonary/Chest: Effort normal and breath sounds normal. No respiratory distress.  Musculoskeletal:  Normal range of motion.  Neurological: He is alert and oriented to person, place, and time.  Skin: Skin is intact. No rash noted.  Psychiatric: He has a normal mood and affect. His speech is normal and behavior is normal. Judgment and thought content normal. Cognition and memory are normal.    Results for orders placed or performed in visit on 02/01/15  CBC with Differential/Platelet  Result Value Ref Range   WBC 7.5 3.4 - 10.8 x10E3/uL   RBC 4.91 4.14 - 5.80 x10E6/uL   Hemoglobin 14.2 12.6 - 17.7 g/dL   Hematocrit 42.0 37.5 - 51.0 %   MCV 86 79 - 97 fL   MCH 28.9 26.6 - 33.0 pg   MCHC 33.8 31.5 - 35.7 g/dL   RDW 14.5 12.3 - 15.4 %   Platelets 200 150 - 379 x10E3/uL   Neutrophils 50 %   Lymphs 34 %   Monocytes 10 %   Eos 5 %   Basos 1 %   Neutrophils Absolute 3.8 1.4 - 7.0 x10E3/uL   Lymphocytes Absolute 2.5 0.7 - 3.1 x10E3/uL   Monocytes Absolute 0.7 0.1 - 0.9 x10E3/uL   EOS (ABSOLUTE) 0.4 0.0 - 0.4 x10E3/uL   Basophils Absolute 0.0 0.0 - 0.2 x10E3/uL   Immature Granulocytes 0 %   Immature Grans (Abs) 0.0 0.0 - 0.1 x10E3/uL  Comprehensive metabolic panel  Result Value Ref Range   Glucose 97 65 - 99 mg/dL  BUN 19 8 - 27 mg/dL   Creatinine, Ser 0.92 0.76 - 1.27 mg/dL   GFR calc non Af Amer 89 >59 mL/min/1.73   GFR calc Af Amer 103 >59 mL/min/1.73   BUN/Creatinine Ratio 21 10 - 22   Sodium 136 134 - 144 mmol/L   Potassium 4.4 3.5 - 5.2 mmol/L   Chloride 98 96 - 106 mmol/L   CO2 22 18 - 29 mmol/L   Calcium 9.2 8.6 - 10.2 mg/dL   Total Protein 6.8 6.0 - 8.5 g/dL   Albumin 4.2 3.6 - 4.8 g/dL   Globulin, Total 2.6 1.5 - 4.5 g/dL   Albumin/Globulin Ratio 1.6 1.1 - 2.5   Bilirubin Total 0.3 0.0 - 1.2 mg/dL   Alkaline Phosphatase 71 39 - 117 IU/L   AST 29 0 - 40 IU/L   ALT 30 0 - 44 IU/L  Lipid Panel w/o Chol/HDL Ratio  Result Value Ref Range   Cholesterol, Total 186 100 - 199 mg/dL   Triglycerides 226 (H) 0 - 149 mg/dL   HDL 35 (L) >39 mg/dL   VLDL Cholesterol Cal 45  (H) 5 - 40 mg/dL   LDL Calculated 106 (H) 0 - 99 mg/dL  PSA  Result Value Ref Range   Prostate Specific Ag, Serum 1.3 0.0 - 4.0 ng/mL  TSH  Result Value Ref Range   TSH 2.800 0.450 - 4.500 uIU/mL  Urinalysis, Routine w reflex microscopic (not at [facility])  Result Value Ref Range   Specific Gravity, UA 1.015 1.005 - 1.030   pH, UA 7.0 5.0 - 7.5   Color, UA Yellow Yellow   Appearance Ur Clear Clear   Leukocytes, UA Negative Negative   Protein, UA Negative Negative/Trace   Glucose, UA Negative Negative   Ketones, UA Negative Negative   RBC, UA Negative Negative   Bilirubin, UA Negative Negative   Urobilinogen, Ur 0.2 0.2 - 1.0 mg/dL   Nitrite, UA Negative Negative  Bayer DCA Hb A1c Waived  Result Value Ref Range   Bayer DCA Hb A1c Waived 6.2 <7.0 %  Hepatitis C Antibody  Result Value Ref Range   Hep C Virus Ab <0.1 0.0 - 0.9 s/co ratio      Assessment & Plan:   Problem List Items Addressed This Visit      Cardiovascular and Mediastinum   Essential hypertension    The current medical regimen is effective;  continue present plan and medications.       Relevant Medications   benazepril (LOTENSIN) 40 MG tablet   Other Relevant Orders   Bayer DCA Hb A1c Waived   Basic metabolic panel     Endocrine   Diabetes mellitus without complication (HCC) - Primary    Continue diet control weight loss nutrition      Relevant Medications   benazepril (LOTENSIN) 40 MG tablet   Other Relevant Orders   Bayer DCA Hb A1c Waived   Basic metabolic panel     Other   Restless legs    The current medical regimen is effective;  continue present plan and medications.       Relevant Medications   gabapentin (NEURONTIN) 300 MG capsule   BMI 38.0-38.9,adult    Continuing diet exercise nutrition       Other Visit Diagnoses   None.      Follow up plan: Return in about 6 months (around 02/02/2016) for Physical Exam.

## 2015-08-02 NOTE — Assessment & Plan Note (Signed)
Continue diet control weight loss nutrition

## 2015-08-03 ENCOUNTER — Encounter: Payer: Self-pay | Admitting: Family Medicine

## 2015-08-03 LAB — BASIC METABOLIC PANEL
BUN / CREAT RATIO: 15 (ref 10–24)
BUN: 15 mg/dL (ref 8–27)
CHLORIDE: 101 mmol/L (ref 96–106)
CO2: 26 mmol/L (ref 18–29)
Calcium: 9.2 mg/dL (ref 8.6–10.2)
Creatinine, Ser: 1.01 mg/dL (ref 0.76–1.27)
GFR calc Af Amer: 92 mL/min/{1.73_m2} (ref >59)
GFR calc non Af Amer: 80 mL/min/{1.73_m2} (ref >59)
Glucose: 105 mg/dL — ABNORMAL HIGH (ref 65–99)
POTASSIUM: 4.7 mmol/L (ref 3.5–5.2)
SODIUM: 141 mmol/L (ref 134–144)

## 2016-01-19 ENCOUNTER — Emergency Department
Admission: EM | Admit: 2016-01-19 | Discharge: 2016-01-19 | Disposition: A | Discharge status: Home / Self Care | Payer: BC Managed Care – PPO | Attending: Emergency Medicine | Admitting: Emergency Medicine

## 2016-01-19 ENCOUNTER — Encounter: Payer: Self-pay | Admitting: Emergency Medicine

## 2016-01-19 ENCOUNTER — Emergency Department: Payer: BC Managed Care – PPO

## 2016-01-19 DIAGNOSIS — I10 Essential (primary) hypertension: Secondary | ICD-10-CM | POA: Insufficient documentation

## 2016-01-19 DIAGNOSIS — Z7982 Long term (current) use of aspirin: Secondary | ICD-10-CM | POA: Insufficient documentation

## 2016-01-19 DIAGNOSIS — R1031 Right lower quadrant pain: Secondary | ICD-10-CM | POA: Diagnosis not present

## 2016-01-19 DIAGNOSIS — Z79899 Other long term (current) drug therapy: Secondary | ICD-10-CM | POA: Insufficient documentation

## 2016-01-19 DIAGNOSIS — R109 Unspecified abdominal pain: Secondary | ICD-10-CM | POA: Diagnosis present

## 2016-01-19 DIAGNOSIS — E119 Type 2 diabetes mellitus without complications: Secondary | ICD-10-CM | POA: Insufficient documentation

## 2016-01-19 HISTORY — DX: Calculus of kidney: N20.0

## 2016-01-19 LAB — BASIC METABOLIC PANEL
ANION GAP: 6 (ref 5–15)
BUN: 15 mg/dL (ref 6–20)
CHLORIDE: 108 mmol/L (ref 101–111)
CO2: 23 mmol/L (ref 22–32)
Calcium: 9 mg/dL (ref 8.9–10.3)
Creatinine, Ser: 0.9 mg/dL (ref 0.61–1.24)
GFR calc Af Amer: >60 mL/min (ref >60)
GLUCOSE: 109 mg/dL — AB (ref 65–99)
POTASSIUM: 4.3 mmol/L (ref 3.5–5.1)
SODIUM: 137 mmol/L (ref 135–145)

## 2016-01-19 LAB — CBC
HCT: 41.9 % (ref 40.0–52.0)
HEMOGLOBIN: 14.3 g/dL (ref 13.0–18.0)
MCH: 28.9 pg (ref 26.0–34.0)
MCHC: 34.2 g/dL (ref 32.0–36.0)
MCV: 84.5 fL (ref 80.0–100.0)
Platelets: 208 10*3/uL (ref 150–440)
RBC: 4.96 MIL/uL (ref 4.40–5.90)
RDW: 14 % (ref 11.5–14.5)
WBC: 6.7 10*3/uL (ref 3.8–10.6)

## 2016-01-19 LAB — URINALYSIS, COMPLETE (UACMP) WITH MICROSCOPIC
BACTERIA UA: NONE SEEN
Bilirubin Urine: NEGATIVE
Glucose, UA: NEGATIVE mg/dL
Hgb urine dipstick: NEGATIVE
Ketones, ur: NEGATIVE mg/dL
Leukocytes, UA: NEGATIVE
Nitrite: NEGATIVE
PH: 7 (ref 5.0–8.0)
Protein, ur: NEGATIVE mg/dL
SPECIFIC GRAVITY, URINE: 1.009 (ref 1.005–1.030)

## 2016-01-19 MED ORDER — KETOROLAC TROMETHAMINE 30 MG/ML IJ SOLN
15.0000 mg | INTRAMUSCULAR | Status: AC
  Administered 2016-01-19: 15 mg via INTRAVENOUS
  Filled 2016-01-19: qty 1

## 2016-01-19 MED ORDER — FENTANYL CITRATE (PF) 100 MCG/2ML IJ SOLN
50.0000 ug | INTRAMUSCULAR | Status: DC | PRN
  Administered 2016-01-19: 50 ug via INTRAVENOUS
  Filled 2016-01-19: qty 2

## 2016-01-19 MED ORDER — NAPROXEN 500 MG PO TABS: 500.0000 mg | ORAL_TABLET | Freq: Two times a day (BID) | ORAL | 0 refills | Status: DC

## 2016-01-19 MED ORDER — METHOCARBAMOL 500 MG PO TABS: 500.0000 mg | ORAL_TABLET | Freq: Three times a day (TID) | ORAL | 0 refills | Status: DC | PRN

## 2016-01-19 MED ORDER — OXYCODONE-ACETAMINOPHEN 5-325 MG PO TABS: 1.0000 | ORAL_TABLET | Freq: Four times a day (QID) | ORAL | 0 refills | Status: DC | PRN

## 2016-01-19 MED ORDER — OXYCODONE-ACETAMINOPHEN 5-325 MG PO TABS: 1.0000 | ORAL_TABLET | Freq: Once | ORAL | Status: DC

## 2016-01-19 NOTE — ED Triage Notes (Signed)
Pt c/o pain to right flank and LLQ. Pt appears in lot pain. Hx kidney stone, feels same. Denies NVD

## 2016-01-19 NOTE — ED Provider Notes (Signed)
[facility] Emergency Department Provider Note  ____________________________________________  Time seen: Approximately 12:57 PM  I have reviewed the triage vital signs and the nursing notes.   HISTORY  Chief Complaint Flank Pain    HPI Robert Griffith is a 61 y.o. male who complains of right flank pain radiating around to the right lower quadrant that started yesterday. He reports it is constant but waxing and waning. Worse with change in position. No heavy lifting slips trips falls or other trauma. Reports a history of kidney stone and this feels similar. Denies hematuria or dysuria. No fevers chills or sweats.     Past Medical History:  Diagnosis Date  . Diabetes mellitus without complication ([place])   . Kidney stone   . Obesity   . Restless legs      Patient Active Problem List   Diagnosis Date Noted  . BMI 38.0-38.9,adult 08/02/2015  . Restless legs 02/01/2015  . Diabetes mellitus without complication ([place])   . Essential hypertension   . Encounter for preventive health examination 04/18/2010     Past Surgical History:  Procedure Laterality Date  . left toe surgery    . sleep apnea surgery    . TONSILLECTOMY AND ADENOIDECTOMY       Prior to Admission medications   Medication Sig Start Date End Date Taking? Authorizing Provider  aspirin 81 MG EC tablet Take 81 mg by mouth daily.     Yes Historical Provider, MD  benazepril (LOTENSIN) 40 MG tablet Take 1 tablet (40 mg total) by mouth daily. 08/02/15  Yes Guadalupe Maple, MD  gabapentin (NEURONTIN) 300 MG capsule Take 1 capsule (300 mg total) by mouth 4 (four) times daily. 08/02/15  Yes Guadalupe Maple, MD  ibuprofen (ADVIL,MOTRIN) 200 MG tablet Take 600 mg by mouth every 6 (six) hours as needed.   Yes Historical Provider, MD  Fluticasone-Salmeterol (ADVAIR) 100-50 MCG/DOSE AEPB Inhale 1 puff into the lungs 2 (two) times daily.    Historical Provider, MD  glucose blood test strip Test daily DX  DM 2 E 11.9 02/09/15   Guadalupe Maple, MD  methocarbamol (ROBAXIN) 500 MG tablet Take 1 tablet (500 mg total) by mouth every 8 (eight) hours as needed for muscle spasms. 01/19/16   Carrie Mew, MD  naproxen (NAPROSYN) 500 MG tablet Take 1 tablet (500 mg total) by mouth 2 (two) times daily with a meal. 01/19/16   Carrie Mew, MD  oxyCODONE-acetaminophen (ROXICET) 5-325 MG tablet Take 1 tablet by mouth every 6 (six) hours as needed for severe pain. 01/19/16   Carrie Mew, MD  sildenafil (REVATIO) 20 MG tablet Take 1 tablet (20 mg total) by mouth daily as needed. 08/02/15   Guadalupe Maple, MD     Allergies Patient has no known allergies.   Family History  Problem Relation Age of Onset  . Heart attack Mother   . Heart disease Mother   . Heart disease Father   . Stroke Brother   . Coronary artery disease Other     4 family members    Social History Social History  Substance Use Topics  . Smoking status: Never Smoker  . Smokeless tobacco: Never Used  . Alcohol use No    Review of Systems  Constitutional:   No fever or chills.  ENT:   No sore throat. No rhinorrhea. Cardiovascular:   No chest pain. Respiratory:   No dyspnea or cough. Gastrointestinal:   Negative for abdominal pain, vomiting and  diarrhea.  Genitourinary:   Negative for dysuria or difficulty urinating. Musculoskeletal:   Right flank pain as above Neurological:   Negative for headaches 10-point ROS otherwise negative.  ____________________________________________   PHYSICAL EXAM:  VITAL SIGNS: ED Triage Vitals  Enc Vitals Group     BP 01/19/16 1137 110/73     Pulse Rate 01/19/16 1137 (!) 58     Resp 01/19/16 1137 17     Temp --      Temp src --      SpO2 01/19/16 1137 96 %     Weight 01/19/16 1056 240 lb (108.9 kg)     Height 01/19/16 1056 5\' 8"  (1.727 m)     Head Circumference --      Peak Flow --      Pain Score 01/19/16 1056 10     Pain Loc --      Pain Edu? --      Excl. in [place]? --      Vital signs reviewed, nursing assessments reviewed.   Constitutional:   Alert and oriented. Well appearing and in no distress. Eyes:   No scleral icterus. No conjunctival pallor. PERRL. EOMI.  No nystagmus. ENT   Head:   Normocephalic and atraumatic.   Nose:   No congestion/rhinnorhea. No septal hematoma   Mouth/Throat:   MMM, no pharyngeal erythema. No peritonsillar mass.    Neck:   No stridor. No SubQ emphysema. No meningismus. Hematological/Lymphatic/Immunilogical:   No cervical lymphadenopathy. Cardiovascular:   RRR. Symmetric bilateral radial and DP pulses.  No murmurs.  Respiratory:   Normal respiratory effort without tachypnea nor retractions. Breath sounds are clear and equal bilaterally. No wheezes/rales/rhonchi. Gastrointestinal:   Soft with mild tenderness in right lower quadrant. No tenderness at McBurney's point.. Non distended. There is no CVA tenderness.  No rebound, rigidity, or guarding. Genitourinary:   deferred Musculoskeletal:   No midline spinal tenderness. There is tenderness in the spinous musculature in the right lower back where muscles are very tense as well. Extremities are Nontender with normal range of motion in all extremities. No joint effusions.  .  No edema. Straight leg raise negative Neurologic:   Normal speech and language.  CN 2-10 normal. Motor grossly intact. No gross focal neurologic deficits are appreciated.  Skin:    Skin is warm, dry and intact. No rash noted.  No petechiae, purpura, or bullae.  ____________________________________________    LABS (pertinent positives/negatives) (all labs ordered are listed, but only abnormal results are displayed) Labs Reviewed  URINALYSIS, COMPLETE (UACMP) WITH MICROSCOPIC - Abnormal; Notable for the following:       Result Value   Color, Urine STRAW (*)    APPearance CLEAR (*)    Squamous Epithelial / LPF 0-5 (*)    All other components within normal limits  BASIC METABOLIC PANEL -  Abnormal; Notable for the following:    Glucose, Bld 109 (*)    All other components within normal limits  CBC   ____________________________________________   EKG    ____________________________________________    RADIOLOGY  CT abdomen and pelvis shows small stones in the right kidney, nonspecific adrenal mass on the left. No acute findings  ____________________________________________   PROCEDURES Procedures  ____________________________________________   INITIAL IMPRESSION / ASSESSMENT AND PLAN / ED COURSE  Pertinent labs & imaging results that were available during my care of the patient were reviewed by me and considered in my medical decision making (see chart for details).  Patient presents with right  flank pain, clinically this is likely due to muscle strain and spasm in the right lower back. However, with his diabetes history of kidney stones and some right lower quadrant tenderness, CT scan was obtained which was unremarkable. Labs unremarkable, vital signs normal.  Treat with analgesia, muscle relaxers, follow-up with primary care.  Considering the patient's symptoms, medical history, and physical examination today, I have low suspicion for cholecystitis or biliary pathology, pancreatitis, perforation or bowel obstruction, hernia, intra-abdominal abscess, AAA or dissection, volvulus or intussusception, mesenteric ischemia, or appendicitis.         ____________________________________________   FINAL CLINICAL IMPRESSION(S) / ED DIAGNOSES  Final diagnoses:  Right flank pain      New Prescriptions   METHOCARBAMOL (ROBAXIN) 500 MG TABLET    Take 1 tablet (500 mg total) by mouth every 8 (eight) hours as needed for muscle spasms.   NAPROXEN (NAPROSYN) 500 MG TABLET    Take 1 tablet (500 mg total) by mouth 2 (two) times daily with a meal.   OXYCODONE-ACETAMINOPHEN (ROXICET) 5-325 MG TABLET    Take 1 tablet by mouth every 6 (six) hours as needed for  severe pain.     Portions of this note were generated with dragon dictation software. Dictation errors may occur despite best attempts at proofreading.    Carrie Mew, MD 01/19/16 1309

## 2016-01-23 ENCOUNTER — Ambulatory Visit (INDEPENDENT_AMBULATORY_CARE_PROVIDER_SITE_OTHER): Payer: BC Managed Care – PPO | Admitting: Family Medicine

## 2016-01-23 ENCOUNTER — Encounter: Payer: Self-pay | Admitting: Family Medicine

## 2016-01-23 VITALS — BP 145/82 | HR 59 | Temp 98.2°F | Wt 258.0 lb

## 2016-01-23 DIAGNOSIS — M5441 Lumbago with sciatica, right side: Secondary | ICD-10-CM | POA: Diagnosis not present

## 2016-01-23 MED ORDER — PREDNISONE 20 MG PO TABS: 40.0000 mg | ORAL_TABLET | Freq: Every day | ORAL | 0 refills | Status: DC

## 2016-01-23 MED ORDER — METHOCARBAMOL 500 MG PO TABS: 500.0000 mg | ORAL_TABLET | Freq: Three times a day (TID) | ORAL | 0 refills | Status: DC | PRN

## 2016-01-23 NOTE — Patient Instructions (Signed)
Follow up as needed

## 2016-01-23 NOTE — Progress Notes (Signed)
BP (!) 145/82   Pulse (!) 59   Temp 98.2 F (36.8 C)   Wt 258 lb (117 kg)   SpO2 97%   BMI 39.23 kg/m    Subjective:    Patient ID: Robert Griffith, male    DOB: February 07, 1955, 61 y.o.   MRN: [id number]  HPI: Robert Griffith is a 61 y.o. male  Chief Complaint  Patient presents with  . Tailbone Pain    went to the ED last week, not much improved. Lower right side, worse when sitting. Usually stays localized, but once today it did radiate down his leg. No known injury.    Patient presents for ER follow up with right low back pain with spasms x 5 days. Denies any new activity or inciting event. Pain is throbbing and like electrical shocks in his back that occasionally go down right leg. Has had similar issues off and on for years. Had an MRI about 5 years ago that was negative. Sitting down seems to make the pain come on and worsen. Pain is not constant. Denies bowel/ bladder incontinence, saddle paresthesias, fever, chills, hematuria or other urinary sxs.   ER did CT scan given his hx of kidney stones, which was negative for acute abnormalities. Given robaxin, oxycodone, and naprosyn which help temporarily some per patient.   Relevant past medical, surgical, family and social history reviewed and updated as indicated. Interim medical history since our last visit reviewed. Allergies and medications reviewed and updated.  Review of Systems  Constitutional: Negative.   HENT: Negative.   Eyes: Negative.   Respiratory: Negative.   Cardiovascular: Negative.   Gastrointestinal: Negative.   Genitourinary: Negative.   Musculoskeletal: Positive for back pain.  Skin: Negative.   Neurological: Negative.   Psychiatric/Behavioral: Negative.     Per HPI unless specifically indicated above     Objective:    BP (!) 145/82   Pulse (!) 59   Temp 98.2 F (36.8 C)   Wt 258 lb (117 kg)   SpO2 97%   BMI 39.23 kg/m   Wt Readings from Last 3 Encounters:  01/23/16 258 lb (117 kg)    01/19/16 240 lb (108.9 kg)  08/02/15 251 lb (113.9 kg)    Physical Exam  Constitutional: He is oriented to person, place, and time. He appears well-developed and well-nourished. No distress.  HENT:  Head: Atraumatic.  Eyes: Conjunctivae are normal. Pupils are equal, round, and reactive to light.  Neck: Normal range of motion. Neck supple.  Cardiovascular: Normal rate and normal heart sounds.   Pulmonary/Chest: Effort normal. No respiratory distress.  Abdominal: Soft. Bowel sounds are normal.  Musculoskeletal: Normal range of motion.  Antalgic gait ROM is slow but intact SLR -   Lymphadenopathy:    He has no cervical adenopathy.  Neurological: He is alert and oriented to person, place, and time. He exhibits normal muscle tone. Coordination normal.  Skin: Skin is warm and dry.  Psychiatric: He has a normal mood and affect. His behavior is normal.  Nursing note and vitals reviewed.   Results for orders placed or performed during the hospital encounter of 01/19/16  Urinalysis, Complete w Microscopic  Result Value Ref Range   Color, Urine STRAW (A) YELLOW   APPearance CLEAR (A) CLEAR   Specific Gravity, Urine 1.009 1.005 - 1.030   pH 7.0 5.0 - 8.0   Glucose, UA NEGATIVE NEGATIVE mg/dL   Hgb urine dipstick NEGATIVE NEGATIVE   Bilirubin Urine NEGATIVE NEGATIVE  Ketones, ur NEGATIVE NEGATIVE mg/dL   Protein, ur NEGATIVE NEGATIVE mg/dL   Nitrite NEGATIVE NEGATIVE   Leukocytes, UA NEGATIVE NEGATIVE   RBC / HPF 0-5 0 - 5 RBC/hpf   WBC, UA 0-5 0 - 5 WBC/hpf   Bacteria, UA NONE SEEN NONE SEEN   Squamous Epithelial / LPF 0-5 (A) NONE SEEN  CBC  Result Value Ref Range   WBC 6.7 3.8 - 10.6 K/uL   RBC 4.96 4.40 - 5.90 MIL/uL   Hemoglobin 14.3 13.0 - 18.0 g/dL   HCT 41.9 40.0 - 52.0 %   MCV 84.5 80.0 - 100.0 fL   MCH 28.9 26.0 - 34.0 pg   MCHC 34.2 32.0 - 36.0 g/dL   RDW 14.0 11.5 - 14.5 %   Platelets 208 150 - 440 K/uL  Basic metabolic panel  Result Value Ref Range   Sodium  137 135 - 145 mmol/L   Potassium 4.3 3.5 - 5.1 mmol/L   Chloride 108 101 - 111 mmol/L   CO2 23 22 - 32 mmol/L   Glucose, Bld 109 (H) 65 - 99 mg/dL   BUN 15 6 - 20 mg/dL   Creatinine, Ser 0.90 0.61 - 1.24 mg/dL   Calcium 9.0 8.9 - 10.3 mg/dL   GFR calc non Af Amer >60 >60 mL/min   GFR calc Af Amer >60 >60 mL/min   Anion gap 6 5 - 15      Assessment & Plan:   Problem List Items Addressed This Visit    None    Visit Diagnoses    Acute right-sided low back pain with right-sided sciatica    -  Primary   Continue robaxin and oxycodone prn, start prednisone burst. Discussed not to take NSAIDs during prednisone. Epsom salt soaks, gentle stretches   Relevant Medications   methocarbamol (ROBAXIN) 500 MG tablet   predniSONE (DELTASONE) 20 MG tablet       Follow up plan: Return if symptoms worsen or fail to improve.

## 2016-02-01 ENCOUNTER — Encounter: Payer: Self-pay | Admitting: Family Medicine

## 2016-02-05 ENCOUNTER — Other Ambulatory Visit: Payer: Self-pay | Admitting: Family Medicine

## 2016-02-06 ENCOUNTER — Encounter: Payer: Self-pay | Admitting: Family Medicine

## 2016-02-06 ENCOUNTER — Ambulatory Visit (INDEPENDENT_AMBULATORY_CARE_PROVIDER_SITE_OTHER): Payer: BC Managed Care – PPO | Admitting: Family Medicine

## 2016-02-06 VITALS — BP 111/75 | HR 72 | Ht 68.9 in | Wt 255.0 lb

## 2016-02-06 DIAGNOSIS — E119 Type 2 diabetes mellitus without complications: Secondary | ICD-10-CM | POA: Diagnosis not present

## 2016-02-06 DIAGNOSIS — Z0000 Encounter for general adult medical examination without abnormal findings: Secondary | ICD-10-CM | POA: Diagnosis not present

## 2016-02-06 DIAGNOSIS — Z6838 Body mass index (BMI) 38.0-38.9, adult: Secondary | ICD-10-CM | POA: Diagnosis not present

## 2016-02-06 DIAGNOSIS — Z125 Encounter for screening for malignant neoplasm of prostate: Secondary | ICD-10-CM | POA: Diagnosis not present

## 2016-02-06 DIAGNOSIS — Z13220 Encounter for screening for lipoid disorders: Secondary | ICD-10-CM | POA: Diagnosis not present

## 2016-02-06 DIAGNOSIS — M545 Low back pain, unspecified: Secondary | ICD-10-CM

## 2016-02-06 DIAGNOSIS — Z114 Encounter for screening for human immunodeficiency virus [HIV]: Secondary | ICD-10-CM | POA: Diagnosis not present

## 2016-02-06 DIAGNOSIS — I10 Essential (primary) hypertension: Secondary | ICD-10-CM | POA: Diagnosis not present

## 2016-02-06 DIAGNOSIS — G2581 Restless legs syndrome: Secondary | ICD-10-CM

## 2016-02-06 DIAGNOSIS — G8929 Other chronic pain: Secondary | ICD-10-CM

## 2016-02-06 DIAGNOSIS — Z1329 Encounter for screening for other suspected endocrine disorder: Secondary | ICD-10-CM | POA: Diagnosis not present

## 2016-02-06 MED ORDER — BENAZEPRIL HCL 40 MG PO TABS: 40.0000 mg | ORAL_TABLET | Freq: Every day | ORAL | 12 refills | Status: DC

## 2016-02-06 MED ORDER — MELOXICAM 15 MG PO TABS: 15.0000 mg | ORAL_TABLET | Freq: Every day | ORAL | 3 refills | Status: DC

## 2016-02-06 MED ORDER — GABAPENTIN 300 MG PO CAPS: 300.0000 mg | ORAL_CAPSULE | Freq: Four times a day (QID) | ORAL | 12 refills | Status: DC

## 2016-02-06 MED ORDER — SILDENAFIL CITRATE 20 MG PO TABS: 20.0000 mg | ORAL_TABLET | Freq: Every day | ORAL | 12 refills | Status: DC | PRN

## 2016-02-06 NOTE — Assessment & Plan Note (Signed)
Weight loss diet exercise

## 2016-02-06 NOTE — Progress Notes (Signed)
BP 111/75   Pulse 72   Ht 5' 8.9" (1.75 m)   Wt 255 lb (115.7 kg)   SpO2 96%   BMI 37.77 kg/m    Subjective:    Patient ID: Robert Griffith, male    DOB: 02-07-1955, 61 y.o.   MRN: [id number]  HPI: Robert Griffith is a 60 y.o. male  Chief Complaint  Patient presents with  . Annual Exam  Patient follow-up biggest concern today is continued low back pain in the right flank area had workup but the ER with negative for renal issues kidney stone issues etc. Patient diagnosed with back muscle spasm. Patient drove to the beach and back this weekend is now back is flared up and hurting again has been taking muscle relaxers without problems. Took prednisone which helped a lot Diabetes doing well no complaints no low blood sugar spells Blood pressure doing well no complaints doing well  Relevant past medical, surgical, family and social history reviewed and updated as indicated. Interim medical history since our last visit reviewed. Allergies and medications reviewed and updated.  Review of Systems  Constitutional: Negative.   HENT: Negative.   Eyes: Negative.   Respiratory: Negative.   Cardiovascular: Negative.   Gastrointestinal: Negative.   Endocrine: Negative.   Genitourinary: Negative.   Musculoskeletal: Negative.   Skin: Negative.   Allergic/Immunologic: Negative.   Neurological: Negative.   Hematological: Negative.   Psychiatric/Behavioral: Negative.     Per HPI unless specifically indicated above     Objective:    BP 111/75   Pulse 72   Ht 5' 8.9" (1.75 m)   Wt 255 lb (115.7 kg)   SpO2 96%   BMI 37.77 kg/m   Wt Readings from Last 3 Encounters:  02/06/16 255 lb (115.7 kg)  01/23/16 258 lb (117 kg)  01/19/16 240 lb (108.9 kg)    Physical Exam  Constitutional: He is oriented to person, place, and time. He appears well-developed and well-nourished.  HENT:  Head: Normocephalic and atraumatic.  Right Ear: External ear normal.  Left Ear: External ear  normal.  Eyes: Conjunctivae and EOM are normal. Pupils are equal, round, and reactive to light.  Neck: Normal range of motion. Neck supple.  Cardiovascular: Normal rate, regular rhythm, normal heart sounds and intact distal pulses.   Pulmonary/Chest: Effort normal and breath sounds normal.  Abdominal: Soft. Bowel sounds are normal. There is no splenomegaly or hepatomegaly.  Genitourinary: Rectum normal, prostate normal and penis normal.  Musculoskeletal: Normal range of motion.  Neurological: He is alert and oriented to person, place, and time. He has normal reflexes.  Skin: No rash noted. No erythema.  Psychiatric: He has a normal mood and affect. His behavior is normal. Judgment and thought content normal.    Results for orders placed or performed during the hospital encounter of 01/19/16  Urinalysis, Complete w Microscopic  Result Value Ref Range   Color, Urine STRAW (A) YELLOW   APPearance CLEAR (A) CLEAR   Specific Gravity, Urine 1.009 1.005 - 1.030   pH 7.0 5.0 - 8.0   Glucose, UA NEGATIVE NEGATIVE mg/dL   Hgb urine dipstick NEGATIVE NEGATIVE   Bilirubin Urine NEGATIVE NEGATIVE   Ketones, ur NEGATIVE NEGATIVE mg/dL   Protein, ur NEGATIVE NEGATIVE mg/dL   Nitrite NEGATIVE NEGATIVE   Leukocytes, UA NEGATIVE NEGATIVE   RBC / HPF 0-5 0 - 5 RBC/hpf   WBC, UA 0-5 0 - 5 WBC/hpf   Bacteria, UA NONE SEEN NONE SEEN  Squamous Epithelial / LPF 0-5 (A) NONE SEEN  CBC  Result Value Ref Range   WBC 6.7 3.8 - 10.6 K/uL   RBC 4.96 4.40 - 5.90 MIL/uL   Hemoglobin 14.3 13.0 - 18.0 g/dL   HCT 41.9 40.0 - 52.0 %   MCV 84.5 80.0 - 100.0 fL   MCH 28.9 26.0 - 34.0 pg   MCHC 34.2 32.0 - 36.0 g/dL   RDW 14.0 11.5 - 14.5 %   Platelets 208 150 - 440 K/uL  Basic metabolic panel  Result Value Ref Range   Sodium 137 135 - 145 mmol/L   Potassium 4.3 3.5 - 5.1 mmol/L   Chloride 108 101 - 111 mmol/L   CO2 23 22 - 32 mmol/L   Glucose, Bld 109 (H) 65 - 99 mg/dL   BUN 15 6 - 20 mg/dL    Creatinine, Ser 0.90 0.61 - 1.24 mg/dL   Calcium 9.0 8.9 - 10.3 mg/dL   GFR calc non Af Amer >60 >60 mL/min   GFR calc Af Amer >60 >60 mL/min   Anion gap 6 5 - 15      Assessment & Plan:   Problem List Items Addressed This Visit      Cardiovascular and Mediastinum   Essential hypertension    The current medical regimen is effective;  continue present plan and medications.       Relevant Medications   benazepril (LOTENSIN) 40 MG tablet   sildenafil (REVATIO) 20 MG tablet   Other Relevant Orders   CBC with Differential/Platelet   Comprehensive metabolic panel   Urinalysis, Routine w reflex microscopic   Microalbumin, Urine Waived     Endocrine   Diabetes mellitus without complication ([place])    The current medical regimen is effective;  continue present plan and medications.       Relevant Medications   benazepril (LOTENSIN) 40 MG tablet   Other Relevant Orders   CBC with Differential/Platelet   Comprehensive metabolic panel   Urinalysis, Routine w reflex microscopic   Bayer DCA Hb A1c Waived (STAT)   Microalbumin, Urine Waived     Other   Restless legs    Well controlled with gabapentin.      Relevant Medications   gabapentin (NEURONTIN) 300 MG capsule   BMI 38.0-38.9,adult    Weight loss diet exercise       Other Visit Diagnoses    Annual physical exam    -  Primary   Relevant Orders   HIV antibody (with reflex)   CBC with Differential/Platelet   Comprehensive metabolic panel   Lipid panel   PSA   TSH   Urinalysis, Routine w reflex microscopic   Bayer DCA Hb A1c Waived (STAT)   Microalbumin, Urine Waived   Encounter for screening for HIV       Relevant Orders   HIV antibody (with reflex)   Screening cholesterol level       Relevant Orders   Lipid panel   Thyroid disorder screen       Relevant Orders   TSH   Prostate cancer screening       Relevant Orders   PSA   Chronic left-sided low back pain without sciatica       Discuss back pain care  and treatment stretching use of muscle relaxers will add meloxicam   Relevant Medications   meloxicam (MOBIC) 15 MG tablet       Follow up plan: Return in about 6 months (around 08/05/2016) for  Hemoglobin A1c, BMP.

## 2016-02-06 NOTE — Assessment & Plan Note (Signed)
The current medical regimen is effective;  continue present plan and medications.  

## 2016-02-06 NOTE — Assessment & Plan Note (Signed)
Well-controlled with gabapentin 

## 2016-02-06 NOTE — Telephone Encounter (Signed)
Routing to provider  

## 2016-02-07 ENCOUNTER — Encounter: Payer: Self-pay | Admitting: Family Medicine

## 2016-02-07 LAB — URINALYSIS, ROUTINE W REFLEX MICROSCOPIC
Bilirubin, UA: NEGATIVE
GLUCOSE, UA: NEGATIVE
KETONES UA: NEGATIVE
LEUKOCYTES UA: NEGATIVE
NITRITE UA: NEGATIVE
PROTEIN UA: NEGATIVE
RBC, UA: NEGATIVE
Specific Gravity, UA: 1.005 (ref 1.005–1.030)
Urobilinogen, Ur: 0.2 mg/dL (ref 0.2–1.0)
pH, UA: 7 (ref 5.0–7.5)

## 2016-02-07 LAB — CBC WITH DIFFERENTIAL/PLATELET
BASOS ABS: 0 10*3/uL (ref 0.0–0.2)
Basos: 0 %
EOS (ABSOLUTE): 0.5 10*3/uL — ABNORMAL HIGH (ref 0.0–0.4)
EOS: 6 %
HEMATOCRIT: 40.2 % (ref 37.5–51.0)
HEMOGLOBIN: 13.6 g/dL (ref 13.0–17.7)
IMMATURE GRANS (ABS): 0 10*3/uL (ref 0.0–0.1)
Immature Granulocytes: 0 %
LYMPHS: 38 %
Lymphocytes Absolute: 2.9 10*3/uL (ref 0.7–3.1)
MCH: 28.6 pg (ref 26.6–33.0)
MCHC: 33.8 g/dL (ref 31.5–35.7)
MCV: 85 fL (ref 79–97)
MONOCYTES: 6 %
Monocytes Absolute: 0.5 10*3/uL (ref 0.1–0.9)
NEUTROS ABS: 3.7 10*3/uL (ref 1.4–7.0)
Neutrophils: 50 %
Platelets: 201 10*3/uL (ref 150–379)
RBC: 4.75 x10E6/uL (ref 4.14–5.80)
RDW: 14.4 % (ref 12.3–15.4)
WBC: 7.6 10*3/uL (ref 3.4–10.8)

## 2016-02-07 LAB — COMPREHENSIVE METABOLIC PANEL
ALBUMIN: 4.3 g/dL (ref 3.6–4.8)
ALT: 33 IU/L (ref 0–44)
AST: 34 IU/L (ref 0–40)
Albumin/Globulin Ratio: 1.5 (ref 1.2–2.2)
Alkaline Phosphatase: 70 IU/L (ref 39–117)
BUN / CREAT RATIO: 18 (ref 10–24)
BUN: 16 mg/dL (ref 8–27)
Bilirubin Total: <0.2 mg/dL (ref 0.0–1.2)
CO2: 23 mmol/L (ref 18–29)
CREATININE: 0.91 mg/dL (ref 0.76–1.27)
Calcium: 8.9 mg/dL (ref 8.6–10.2)
Chloride: 101 mmol/L (ref 96–106)
GFR, EST AFRICAN AMERICAN: 104 mL/min/{1.73_m2} (ref >59)
GFR, EST NON AFRICAN AMERICAN: 90 mL/min/{1.73_m2} (ref >59)
GLOBULIN, TOTAL: 2.8 g/dL (ref 1.5–4.5)
GLUCOSE: 103 mg/dL — AB (ref 65–99)
Potassium: 4.3 mmol/L (ref 3.5–5.2)
Sodium: 139 mmol/L (ref 134–144)
TOTAL PROTEIN: 7.1 g/dL (ref 6.0–8.5)

## 2016-02-07 LAB — HIV ANTIBODY (ROUTINE TESTING W REFLEX): HIV SCREEN 4TH GENERATION: NONREACTIVE

## 2016-02-07 LAB — MICROALBUMIN, URINE WAIVED
CREATININE, URINE WAIVED: 10 mg/dL (ref 10–300)
Microalb, Ur Waived: 10 mg/L (ref 0–19)
Microalb/Creat Ratio: <30 mg/g (ref <30)

## 2016-02-07 LAB — LIPID PANEL
CHOL/HDL RATIO: 4.8 ratio (ref 0.0–5.0)
Cholesterol, Total: 201 mg/dL — ABNORMAL HIGH (ref 100–199)
HDL: 42 mg/dL (ref >39)
LDL Calculated: 122 mg/dL — ABNORMAL HIGH (ref 0–99)
Triglycerides: 186 mg/dL — ABNORMAL HIGH (ref 0–149)
VLDL CHOLESTEROL CAL: 37 mg/dL (ref 5–40)

## 2016-02-07 LAB — TSH: TSH: 2.69 u[IU]/mL (ref 0.450–4.500)

## 2016-02-07 LAB — BAYER DCA HB A1C WAIVED: HB A1C (BAYER DCA - WAIVED): 6.6 % (ref <7.0)

## 2016-02-07 LAB — PSA: PROSTATE SPECIFIC AG, SERUM: 1.4 ng/mL (ref 0.0–4.0)

## 2016-02-17 ENCOUNTER — Other Ambulatory Visit: Payer: Self-pay | Admitting: Family Medicine

## 2016-02-17 NOTE — Telephone Encounter (Signed)
  Last routine OV: 02/06/16 Next OV: 08/07/16

## 2016-02-23 ENCOUNTER — Other Ambulatory Visit: Payer: Self-pay | Admitting: Family Medicine

## 2016-03-02 ENCOUNTER — Other Ambulatory Visit: Payer: Self-pay | Admitting: Family Medicine

## 2016-03-13 ENCOUNTER — Other Ambulatory Visit: Payer: Self-pay | Admitting: Family Medicine

## 2016-04-07 ENCOUNTER — Other Ambulatory Visit: Payer: Self-pay | Admitting: Family Medicine

## 2016-04-09 NOTE — Telephone Encounter (Signed)
  Last routine OV: 02/06/16 Next OV: 08/07/16

## 2016-04-24 ENCOUNTER — Other Ambulatory Visit: Payer: Self-pay | Admitting: Family Medicine

## 2016-04-24 NOTE — Telephone Encounter (Signed)
Your patient 

## 2016-05-06 ENCOUNTER — Other Ambulatory Visit: Payer: Self-pay | Admitting: Family Medicine

## 2016-05-07 NOTE — Telephone Encounter (Signed)
Last routine OV: 02/06/16 Next OV: 08/07/16

## 2016-05-25 ENCOUNTER — Other Ambulatory Visit: Payer: Self-pay | Admitting: Family Medicine

## 2016-05-25 NOTE — Telephone Encounter (Signed)
Routing to provider. Appt 08/07/16.

## 2016-06-03 ENCOUNTER — Other Ambulatory Visit: Payer: Self-pay | Admitting: Family Medicine

## 2016-06-03 DIAGNOSIS — M545 Low back pain: Principal | ICD-10-CM

## 2016-06-03 DIAGNOSIS — G8929 Other chronic pain: Secondary | ICD-10-CM

## 2016-06-04 NOTE — Telephone Encounter (Signed)
Last (acute) OV:  Last routine OV: 02/06/16 Next OV: 08/07/16

## 2016-07-13 ENCOUNTER — Encounter: Payer: Self-pay | Admitting: Family Medicine

## 2016-07-14 ENCOUNTER — Other Ambulatory Visit: Payer: Self-pay | Admitting: Family Medicine

## 2016-08-07 ENCOUNTER — Ambulatory Visit: Payer: BC Managed Care – PPO | Admitting: Family Medicine

## 2016-08-13 ENCOUNTER — Ambulatory Visit (INDEPENDENT_AMBULATORY_CARE_PROVIDER_SITE_OTHER): Payer: BC Managed Care – PPO | Admitting: Family Medicine

## 2016-08-13 ENCOUNTER — Encounter: Payer: Self-pay | Admitting: Family Medicine

## 2016-08-13 VITALS — BP 110/68 | HR 71 | Wt 239.0 lb

## 2016-08-13 DIAGNOSIS — I10 Essential (primary) hypertension: Secondary | ICD-10-CM

## 2016-08-13 DIAGNOSIS — E119 Type 2 diabetes mellitus without complications: Secondary | ICD-10-CM

## 2016-08-13 DIAGNOSIS — G8929 Other chronic pain: Secondary | ICD-10-CM

## 2016-08-13 DIAGNOSIS — M545 Low back pain, unspecified: Secondary | ICD-10-CM

## 2016-08-13 DIAGNOSIS — G2581 Restless legs syndrome: Secondary | ICD-10-CM | POA: Diagnosis not present

## 2016-08-13 MED ORDER — MELOXICAM 15 MG PO TABS: 15.0000 mg | ORAL_TABLET | Freq: Every day | ORAL | 5 refills | Status: DC

## 2016-08-13 NOTE — Assessment & Plan Note (Signed)
Doing well will try off meds

## 2016-08-13 NOTE — Progress Notes (Signed)
BP 110/68   Pulse 71   Wt 239 lb (108.4 kg)   SpO2 99%   BMI 35.40 kg/m    Subjective:    Patient ID: Robert Griffith, male    DOB: 1955/12/24, 61 y.o.   MRN: [id number]  HPI: Robert Griffith is a 61 y.o. male  Chief Complaint  Patient presents with  . Follow-up  . Hypertension  . Diabetes   Patient follow-up diabetes doing well no complaints noted low blood sugar spells or issues. Patient with diet controlled diabetes. Patient with really good weight loss about 30 pounds overall. And doing well diabetic neuropathy is also improving is tapered down and looking to stop his gabapentin. Discuss ways to stop and if issues could restart her even try other medications. Blood pressure also doing well. Taking Benzapril with no low blood pressure spells or issues. Discussed takes meloxicam every day which does well. Is not taking occasional holiday. Is taking the muscle relaxer also Relevant past medical, surgical, family and social history reviewed and updated as indicated. Interim medical history since our last visit reviewed. Allergies and medications reviewed and updated.  Review of Systems  Constitutional: Negative.   Respiratory: Negative.   Cardiovascular: Negative.     Per HPI unless specifically indicated above     Objective:    BP 110/68   Pulse 71   Wt 239 lb (108.4 kg)   SpO2 99%   BMI 35.40 kg/m   Wt Readings from Last 3 Encounters:  08/13/16 239 lb (108.4 kg)  02/06/16 255 lb (115.7 kg)  01/23/16 258 lb (117 kg)    Physical Exam  Constitutional: He is oriented to person, place, and time. He appears well-developed and well-nourished.  HENT:  Head: Normocephalic and atraumatic.  Eyes: Conjunctivae and EOM are normal.  Neck: Normal range of motion.  Cardiovascular: Normal rate, regular rhythm and normal heart sounds.   Pulmonary/Chest: Effort normal and breath sounds normal.  Musculoskeletal: Normal range of motion.  Neurological: He is alert and  oriented to person, place, and time.  Skin: No erythema.  Psychiatric: He has a normal mood and affect. His behavior is normal. Judgment and thought content normal.    Results for orders placed or performed in visit on 02/06/16  HIV antibody (with reflex)  Result Value Ref Range   HIV Screen 4th Generation wRfx Non Reactive Non Reactive  CBC with Differential/Platelet  Result Value Ref Range   WBC 7.6 3.4 - 10.8 x10E3/uL   RBC 4.75 4.14 - 5.80 x10E6/uL   Hemoglobin 13.6 13.0 - 17.7 g/dL   Hematocrit 40.2 37.5 - 51.0 %   MCV 85 79 - 97 fL   MCH 28.6 26.6 - 33.0 pg   MCHC 33.8 31.5 - 35.7 g/dL   RDW 14.4 12.3 - 15.4 %   Platelets 201 150 - 379 x10E3/uL   Neutrophils 50 Not Estab. %   Lymphs 38 Not Estab. %   Monocytes 6 Not Estab. %   Eos 6 Not Estab. %   Basos 0 Not Estab. %   Neutrophils Absolute 3.7 1.4 - 7.0 x10E3/uL   Lymphocytes Absolute 2.9 0.7 - 3.1 x10E3/uL   Monocytes Absolute 0.5 0.1 - 0.9 x10E3/uL   EOS (ABSOLUTE) 0.5 (H) 0.0 - 0.4 x10E3/uL   Basophils Absolute 0.0 0.0 - 0.2 x10E3/uL   Immature Granulocytes 0 Not Estab. %   Immature Grans (Abs) 0.0 0.0 - 0.1 x10E3/uL  Comprehensive metabolic panel  Result Value Ref Range  Glucose 103 (H) 65 - 99 mg/dL   BUN 16 8 - 27 mg/dL   Creatinine, Ser 0.91 0.76 - 1.27 mg/dL   GFR calc non Af Amer 90 >59 mL/min/1.73   GFR calc Af Amer 104 >59 mL/min/1.73   BUN/Creatinine Ratio 18 10 - 24   Sodium 139 134 - 144 mmol/L   Potassium 4.3 3.5 - 5.2 mmol/L   Chloride 101 96 - 106 mmol/L   CO2 23 18 - 29 mmol/L   Calcium 8.9 8.6 - 10.2 mg/dL   Total Protein 7.1 6.0 - 8.5 g/dL   Albumin 4.3 3.6 - 4.8 g/dL   Globulin, Total 2.8 1.5 - 4.5 g/dL   Albumin/Globulin Ratio 1.5 1.2 - 2.2   Bilirubin Total <0.2 0.0 - 1.2 mg/dL   Alkaline Phosphatase 70 39 - 117 IU/L   AST 34 0 - 40 IU/L   ALT 33 0 - 44 IU/L  Lipid panel  Result Value Ref Range   Cholesterol, Total 201 (H) 100 - 199 mg/dL   Triglycerides 186 (H) 0 - 149 mg/dL    HDL 42 >39 mg/dL   VLDL Cholesterol Cal 37 5 - 40 mg/dL   LDL Calculated 122 (H) 0 - 99 mg/dL   Chol/HDL Ratio 4.8 0.0 - 5.0 ratio units  PSA  Result Value Ref Range   Prostate Specific Ag, Serum 1.4 0.0 - 4.0 ng/mL  TSH  Result Value Ref Range   TSH 2.690 0.450 - 4.500 uIU/mL  Urinalysis, Routine w reflex microscopic  Result Value Ref Range   Specific Gravity, UA 1.005 1.005 - 1.030   pH, UA 7.0 5.0 - 7.5   Color, UA Yellow Yellow   Appearance Ur Clear Clear   Leukocytes, UA Negative Negative   Protein, UA Negative Negative/Trace   Glucose, UA Negative Negative   Ketones, UA Negative Negative   RBC, UA Negative Negative   Bilirubin, UA Negative Negative   Urobilinogen, Ur 0.2 0.2 - 1.0 mg/dL   Nitrite, UA Negative Negative  Bayer DCA Hb A1c Waived (STAT)  Result Value Ref Range   Bayer DCA Hb A1c Waived 6.6 <7.0 %  Microalbumin, Urine Waived  Result Value Ref Range   Microalb, Ur Waived 10 0 - 19 mg/L   Creatinine, Urine Waived 10 10 - 300 mg/dL   Microalb/Creat Ratio <30 <30 mg/g      Assessment & Plan:   Problem List Items Addressed This Visit      Cardiovascular and Mediastinum   Essential hypertension - Primary    The current medical regimen is effective;  continue present plan and medications.       Relevant Orders   Bayer DCA Hb A1c Waived   Basic metabolic panel     Endocrine   Diabetes mellitus without complication ([place])    The current medical regimen is effective;  continue present plan and medications.       Relevant Orders   Bayer DCA Hb A1c Waived   Basic metabolic panel     Other   Restless legs    Doing well will try off meds       Other Visit Diagnoses    Chronic left-sided low back pain without sciatica       Discuss back pain care and treatment stretching use of muscle relaxers will add meloxicam   Relevant Medications   meloxicam (MOBIC) 15 MG tablet       Follow up plan: Return in about 6 months (around  02/13/2017) for  Physical Exam.

## 2016-08-13 NOTE — Assessment & Plan Note (Signed)
The current medical regimen is effective;  continue present plan and medications.  

## 2016-08-14 ENCOUNTER — Encounter: Payer: Self-pay | Admitting: Family Medicine

## 2016-08-14 LAB — BASIC METABOLIC PANEL
BUN/Creatinine Ratio: 21 (ref 10–24)
BUN: 17 mg/dL (ref 8–27)
CALCIUM: 9.2 mg/dL (ref 8.6–10.2)
CHLORIDE: 102 mmol/L (ref 96–106)
CO2: 22 mmol/L (ref 20–29)
Creatinine, Ser: 0.81 mg/dL (ref 0.76–1.27)
GFR calc non Af Amer: 95 mL/min/{1.73_m2} (ref >59)
GFR, EST AFRICAN AMERICAN: 110 mL/min/{1.73_m2} (ref >59)
Glucose: 110 mg/dL — ABNORMAL HIGH (ref 65–99)
POTASSIUM: 3.9 mmol/L (ref 3.5–5.2)
Sodium: 137 mmol/L (ref 134–144)

## 2016-08-14 LAB — BAYER DCA HB A1C WAIVED: HB A1C: 5.8 % (ref <7.0)

## 2016-09-05 ENCOUNTER — Encounter: Payer: Self-pay | Admitting: Unknown Physician Specialty

## 2016-09-05 ENCOUNTER — Telehealth: Payer: Self-pay | Admitting: Family Medicine

## 2016-09-05 ENCOUNTER — Ambulatory Visit (INDEPENDENT_AMBULATORY_CARE_PROVIDER_SITE_OTHER): Payer: BC Managed Care – PPO | Admitting: Unknown Physician Specialty

## 2016-09-05 VITALS — BP 115/76 | HR 70 | Temp 98.2°F | Wt 236.8 lb

## 2016-09-05 DIAGNOSIS — M545 Low back pain: Secondary | ICD-10-CM

## 2016-09-05 MED ORDER — HYDROCODONE-ACETAMINOPHEN 5-325 MG PO TABS: 1.0000 | ORAL_TABLET | Freq: Four times a day (QID) | ORAL | 0 refills | Status: DC | PRN

## 2016-09-05 MED ORDER — METHYLPREDNISOLONE 4 MG PO TBPK: ORAL_TABLET | ORAL | 0 refills | Status: DC

## 2016-09-05 NOTE — Telephone Encounter (Signed)
Pt is having muscle spasms on the R side of his back. He stated while driving his R leg tends to start going numb. He has been taking the meloxicam and robaxin and they help a little but he is still in pain. He uses [facility] as his pharmacy.

## 2016-09-05 NOTE — Telephone Encounter (Signed)
Please advise 

## 2016-09-05 NOTE — Telephone Encounter (Signed)
Pt scheduled w/ Malachy Mood for this afternoon. Will send telephone encounter to her as FYI.

## 2016-09-05 NOTE — Progress Notes (Signed)
BP 115/76   Pulse 70   Temp 98.2 F (36.8 C)   Wt 236 lb 12.8 oz (107.4 kg)   SpO2 100%   BMI 35.07 kg/m    Subjective:    Patient ID: Robert Griffith, male    DOB: February 11, 1955, 61 y.o.   MRN: [id number]  HPI: Robert Griffith is a 61 y.o. male  Chief Complaint  Patient presents with  . Back Pain    pt states he has been taking continuous melxoicam and robaxin for muscle spasms in his back. states he did not take any over the weekend and has been having terrible right low back pain that makes his leg numb    Pt states back pain started 01/19/2016, went to the ER as he thought he was having kidney stones.  Found out is was muscle spasms.  He has been taking Meloxicam and Metocarbinol daily since then.  On 8/13 Dr. Jeananne Rama suggested a vacation of these medications.  Robert Griffith out of town and pain hit him 5 days ago.  Calls it a hard and grabbing stabbing pain right flank.  He started back on he meds 3 days ago and can't get relief.  Driving here from [place] right leg falls asleep.  States getting in and out of the car is difficult.  Can't bend over.  Sitting makes the pain worse.  Lying down in recliner helps.  Unable to push feet down. He has not been to physical therapy.   No problems with bowel and bladder.  Numbness only with sitting in the car  Relevant past medical, surgical, family and social history reviewed and updated as indicated. Interim medical history since our last visit reviewed. Allergies and medications reviewed and updated.  Review of Systems  Per HPI unless specifically indicated above     Objective:    BP 115/76   Pulse 70   Temp 98.2 F (36.8 C)   Wt 236 lb 12.8 oz (107.4 kg)   SpO2 100%   BMI 35.07 kg/m   Wt Readings from Last 3 Encounters:  09/05/16 236 lb 12.8 oz (107.4 kg)  08/13/16 239 lb (108.4 kg)  02/06/16 255 lb (115.7 kg)    Physical Exam  Constitutional: He is oriented to person, place, and time. He appears well-developed and  well-nourished. No distress.  HENT:  Head: Normocephalic and atraumatic.  Eyes: Conjunctivae and lids are normal. Right eye exhibits no discharge. Left eye exhibits no discharge. No scleral icterus.  Cardiovascular: Normal rate.   Pulmonary/Chest: Effort normal.  Abdominal: Normal appearance. There is no splenomegaly or hepatomegaly.  Musculoskeletal:       Lumbar back: He exhibits decreased range of motion, tenderness, pain and spasm. He exhibits no bony tenderness and no edema.  Neurological: He is alert and oriented to person, place, and time.  Skin: Skin is intact. No rash noted. No pallor.  Psychiatric: He has a normal mood and affect. His behavior is normal. Judgment and thought content normal.    Results for orders placed or performed in visit on 08/13/16  Bayer DCA Hb A1c Waived  Result Value Ref Range   Bayer DCA Hb A1c Waived 5.8 <7.8 %  Basic metabolic panel  Result Value Ref Range   Glucose 110 (H) 65 - 99 mg/dL   BUN 17 8 - 27 mg/dL   Creatinine, Ser 0.81 0.76 - 1.27 mg/dL   GFR calc non Af Amer 95 >59 mL/min/1.73   GFR calc Af Wyvonnia Lora  110 >59 mL/min/1.73   BUN/Creatinine Ratio 21 10 - 24   Sodium 137 134 - 144 mmol/L   Potassium 3.9 3.5 - 5.2 mmol/L   Chloride 102 96 - 106 mmol/L   CO2 22 20 - 29 mmol/L   Calcium 9.2 8.6 - 10.2 mg/dL      Assessment & Plan:   Problem List Items Addressed This Visit    None    Visit Diagnoses    Right low back pain, unspecified chronicity, with sciatica presence unspecified    -  Primary   Refer to PT.  Pt will make appt.  Rx given.  Limited rx of [place].  Replace Meloxicam with Medrol dose pack fo 5 days   Relevant Medications   methylPREDNISolone (MEDROL DOSEPAK) 4 MG TBPK tablet   HYDROcodone-acetaminophen (NORCO/VICODIN) 5-325 MG tablet   Other Relevant Orders   UA/M w/rflx Culture, Routine   Ambulatory referral to Physical Therapy       Follow up plan: Return in about 2 weeks (around 09/19/2016).

## 2016-09-05 NOTE — Telephone Encounter (Signed)
Needs an appointment.

## 2016-09-06 LAB — MICROSCOPIC EXAMINATION
BACTERIA UA: NONE SEEN
RBC, UA: NONE SEEN /hpf (ref 0–?)

## 2016-09-06 LAB — UA/M W/RFLX CULTURE, ROUTINE
Bilirubin, UA: NEGATIVE
Glucose, UA: NEGATIVE
Ketones, UA: NEGATIVE
Leukocytes, UA: NEGATIVE
Nitrite, UA: NEGATIVE
PH UA: 6 (ref 5.0–7.5)
PROTEIN UA: NEGATIVE
Specific Gravity, UA: 1.02 (ref 1.005–1.030)
UUROB: 0.2 mg/dL (ref 0.2–1.0)

## 2016-09-19 ENCOUNTER — Ambulatory Visit (INDEPENDENT_AMBULATORY_CARE_PROVIDER_SITE_OTHER): Payer: BC Managed Care – PPO | Admitting: Unknown Physician Specialty

## 2016-09-19 ENCOUNTER — Encounter: Payer: Self-pay | Admitting: Unknown Physician Specialty

## 2016-09-19 VITALS — BP 111/71 | HR 66 | Temp 98.2°F | Wt 235.8 lb

## 2016-09-19 DIAGNOSIS — I10 Essential (primary) hypertension: Secondary | ICD-10-CM

## 2016-09-19 DIAGNOSIS — M545 Low back pain, unspecified: Secondary | ICD-10-CM

## 2016-09-19 DIAGNOSIS — Z23 Encounter for immunization: Secondary | ICD-10-CM

## 2016-09-19 DIAGNOSIS — R21 Rash and other nonspecific skin eruption: Secondary | ICD-10-CM

## 2016-09-19 MED ORDER — BENAZEPRIL HCL 20 MG PO TABS: 20.0000 mg | ORAL_TABLET | Freq: Every day | ORAL | 3 refills | Status: DC

## 2016-09-19 NOTE — Assessment & Plan Note (Signed)
OK for 20 mg of Benzepril.  He will continue to monitor BP with goal of less than 130/80.

## 2016-09-19 NOTE — Patient Instructions (Signed)

## 2016-09-19 NOTE — Progress Notes (Signed)
BP 111/71   Pulse 66   Temp 98.2 F (36.8 C)   Wt 235 lb 12.8 oz (107 kg)   SpO2 96%   BMI 34.92 kg/m    Subjective:    Patient ID: Robert Griffith, male    DOB: October 11, 1955, 61 y.o.   MRN: [id number]  HPI: Robert Griffith is a 61 y.o. male  Chief Complaint  Patient presents with  . Back Pain    2 week f/up  . Medication Problem    pt wants to know if you can give blood while taking meloxicam and robaxin. Also wants to know how long he will have to take these medications  . Hypertension    pt states that he spoke with Dr. Jeananne Rama about taking half of his BP medication and would like a script for this (advised pt that this may need to wait for Dr. Jeananne Rama to return)   Back pain Pt is here with back pain.  He has been going to PT.  It is doing much better and with a little bit of pain today.  Still taking Meloxicam and Robaxin.    Hypertension Pt states his BP seems to be fine even when he misses a dose of Benazepril.  Wonders if he can take a 1/2 dose.  No chest pain and SOB.  Home BP 120/70  Relevant past medical, surgical, family and social history reviewed and updated as indicated. Interim medical history since our last visit reviewed. Allergies and medications reviewed and updated.  Review of Systems  Per HPI unless specifically indicated above     Objective:    BP 111/71   Pulse 66   Temp 98.2 F (36.8 C)   Wt 235 lb 12.8 oz (107 kg)   SpO2 96%   BMI 34.92 kg/m   Wt Readings from Last 3 Encounters:  09/19/16 235 lb 12.8 oz (107 kg)  09/05/16 236 lb 12.8 oz (107.4 kg)  08/13/16 239 lb (108.4 kg)    Physical Exam  Constitutional: He is oriented to person, place, and time. He appears well-developed and well-nourished. No distress.  HENT:  Head: Normocephalic and atraumatic.  Eyes: Conjunctivae and lids are normal. Right eye exhibits no discharge. Left eye exhibits no discharge. No scleral icterus.  Neck: Normal range of motion. Neck supple. No JVD  present. Carotid bruit is not present.  Cardiovascular: Normal rate, regular rhythm and normal heart sounds.   Pulmonary/Chest: Effort normal and breath sounds normal. No respiratory distress.  Abdominal: Normal appearance. There is no splenomegaly or hepatomegaly.  Musculoskeletal: Normal range of motion.  Neurological: He is alert and oriented to person, place, and time.  Skin: Skin is warm, dry and intact. No rash noted. No pallor.  Psychiatric: He has a normal mood and affect. His behavior is normal. Judgment and thought content normal.    Results for orders placed or performed in visit on 09/05/16  Microscopic Examination  Result Value Ref Range   WBC, UA 0-5 0 - 5 /hpf   RBC, UA None seen 0 - 2 /hpf   Epithelial Cells (non renal) 0-10 0 - 10 /hpf   Bacteria, UA None seen None seen/Few  UA/M w/rflx Culture, Routine  Result Value Ref Range   Specific Gravity, UA 1.020 1.005 - 1.030   pH, UA 6.0 5.0 - 7.5   Color, UA Yellow Yellow   Appearance Ur Clear Clear   Leukocytes, UA Negative Negative   Protein, UA Negative Negative/Trace  Glucose, UA Negative Negative   Ketones, UA Negative Negative   RBC, UA Trace (A) Negative   Bilirubin, UA Negative Negative   Urobilinogen, Ur 0.2 0.2 - 1.0 mg/dL   Nitrite, UA Negative Negative   Microscopic Examination See below:       Assessment & Plan:   Problem List Items Addressed This Visit      Unprioritized   Essential hypertension    OK for 20 mg of Benzepril.  He will continue to monitor BP with goal of less than 130/80.        Relevant Medications   benazepril (LOTENSIN) 20 MG tablet    Other Visit Diagnoses    Need for influenza vaccination    -  Primary   Relevant Orders   Flu Vaccine QUAD 36+ mos IM (Completed)   Low back pain without sciatica, unspecified back pain laterality, unspecified chronicity       Improved with PT.  Encouaged to titrate of medications       Follow up plan: Return if symptoms worsen or fail  to improve.

## 2017-02-21 ENCOUNTER — Encounter: Payer: Self-pay | Admitting: Family Medicine

## 2017-02-21 ENCOUNTER — Ambulatory Visit: Payer: BC Managed Care – PPO | Admitting: Family Medicine

## 2017-02-21 VITALS — BP 122/79 | HR 90 | Ht 68.5 in | Wt 242.0 lb

## 2017-02-21 DIAGNOSIS — E119 Type 2 diabetes mellitus without complications: Secondary | ICD-10-CM | POA: Diagnosis not present

## 2017-02-21 DIAGNOSIS — Z125 Encounter for screening for malignant neoplasm of prostate: Secondary | ICD-10-CM | POA: Diagnosis not present

## 2017-02-21 DIAGNOSIS — Z6836 Body mass index (BMI) 36.0-36.9, adult: Secondary | ICD-10-CM

## 2017-02-21 DIAGNOSIS — E7800 Pure hypercholesterolemia, unspecified: Secondary | ICD-10-CM

## 2017-02-21 DIAGNOSIS — Z1329 Encounter for screening for other suspected endocrine disorder: Secondary | ICD-10-CM | POA: Diagnosis not present

## 2017-02-21 DIAGNOSIS — G2581 Restless legs syndrome: Secondary | ICD-10-CM | POA: Diagnosis not present

## 2017-02-21 DIAGNOSIS — Z0001 Encounter for general adult medical examination with abnormal findings: Secondary | ICD-10-CM | POA: Diagnosis not present

## 2017-02-21 DIAGNOSIS — I10 Essential (primary) hypertension: Secondary | ICD-10-CM

## 2017-02-21 LAB — URINALYSIS, ROUTINE W REFLEX MICROSCOPIC
Bilirubin, UA: NEGATIVE
GLUCOSE, UA: NEGATIVE
KETONES UA: NEGATIVE
Leukocytes, UA: NEGATIVE
Nitrite, UA: NEGATIVE
PROTEIN UA: NEGATIVE
RBC, UA: NEGATIVE
Specific Gravity, UA: 1.015 (ref 1.005–1.030)
Urobilinogen, Ur: 0.2 mg/dL (ref 0.2–1.0)
pH, UA: 6.5 (ref 5.0–7.5)

## 2017-02-21 LAB — BAYER DCA HB A1C WAIVED: HB A1C (BAYER DCA - WAIVED): 5.7 % (ref <7.0)

## 2017-02-21 MED ORDER — BENAZEPRIL HCL 10 MG PO TABS: 10.0000 mg | ORAL_TABLET | Freq: Every day | ORAL | 2 refills | Status: DC

## 2017-02-21 NOTE — Assessment & Plan Note (Signed)
The current medical regimen is effective;  continue present plan and medications.  

## 2017-02-21 NOTE — Progress Notes (Signed)
BP 122/79   Pulse 90   Ht 5' 8.5" (1.74 m)   Wt 242 lb (109.8 kg)   SpO2 98%   BMI 36.26 kg/m    Subjective:    Patient ID: Robert Griffith, male    DOB: Nov 07, 1955, 62 y.o.   MRN: [id number]  HPI: Robert Griffith is a 62 y.o. male  Chief Complaint  Patient presents with  . Annual Exam  Patient all in all doing okay is getting a little bit of a head cold but otherwise doing okay.  Diet controlled diabetes doing okay blood pressure also stable.   Relevant past medical, surgical, family and social history reviewed and updated as indicated. Interim medical history since our last visit reviewed. Allergies and medications reviewed and updated.  Review of Systems  Constitutional: Negative.   HENT: Negative.   Eyes: Negative.   Respiratory: Negative.   Cardiovascular: Negative.   Gastrointestinal: Negative.   Endocrine: Negative.   Genitourinary: Negative.   Musculoskeletal: Negative.   Skin: Negative.   Allergic/Immunologic: Negative.   Neurological: Negative.   Hematological: Negative.   Psychiatric/Behavioral: Negative.     Per HPI unless specifically indicated above     Objective:    BP 122/79   Pulse 90   Ht 5' 8.5" (1.74 m)   Wt 242 lb (109.8 kg)   SpO2 98%   BMI 36.26 kg/m   Wt Readings from Last 3 Encounters:  02/21/17 242 lb (109.8 kg)  09/19/16 235 lb 12.8 oz (107 kg)  09/05/16 236 lb 12.8 oz (107.4 kg)    Physical Exam  Constitutional: He is oriented to person, place, and time. He appears well-developed and well-nourished.  HENT:  Head: Normocephalic and atraumatic.  Right Ear: External ear normal.  Left Ear: External ear normal.  Eyes: Conjunctivae and EOM are normal. Pupils are equal, round, and reactive to light.  Neck: Normal range of motion. Neck supple.  Cardiovascular: Normal rate, regular rhythm, normal heart sounds and intact distal pulses.  Pulmonary/Chest: Effort normal and breath sounds normal.  Abdominal: Soft. Bowel sounds are  normal. There is no splenomegaly or hepatomegaly.  Genitourinary: Rectum normal, prostate normal and penis normal.  Musculoskeletal: Normal range of motion.  Neurological: He is alert and oriented to person, place, and time. He has normal reflexes.  Skin: No rash noted. No erythema.  Psychiatric: He has a normal mood and affect. His behavior is normal. Judgment and thought content normal.    Results for orders placed or performed in visit on 09/05/16  Microscopic Examination  Result Value Ref Range   WBC, UA 0-5 0 - 5 /hpf   RBC, UA None seen 0 - 2 /hpf   Epithelial Cells (non renal) 0-10 0 - 10 /hpf   Bacteria, UA None seen None seen/Few  UA/M w/rflx Culture, Routine  Result Value Ref Range   Specific Gravity, UA 1.020 1.005 - 1.030   pH, UA 6.0 5.0 - 7.5   Color, UA Yellow Yellow   Appearance Ur Clear Clear   Leukocytes, UA Negative Negative   Protein, UA Negative Negative/Trace   Glucose, UA Negative Negative   Ketones, UA Negative Negative   RBC, UA Trace (A) Negative   Bilirubin, UA Negative Negative   Urobilinogen, Ur 0.2 0.2 - 1.0 mg/dL   Nitrite, UA Negative Negative   Microscopic Examination See below:       Assessment & Plan:   Problem List Items Addressed This Visit  Cardiovascular and Mediastinum   Essential hypertension - Primary    The current medical regimen is effective;  continue present plan and medications.       Relevant Medications   benazepril (LOTENSIN) 10 MG tablet   Other Relevant Orders   CBC with Differential/Platelet   Comprehensive metabolic panel   Lipid panel   Urinalysis, Routine w reflex microscopic   Bayer DCA Hb A1c Waived     Endocrine   Diabetes mellitus without complication ([place])    The current medical regimen is effective;  continue present plan and medications.       Relevant Medications   benazepril (LOTENSIN) 10 MG tablet   Other Relevant Orders   CBC with Differential/Platelet   Comprehensive metabolic panel     Lipid panel   Urinalysis, Routine w reflex microscopic   Bayer DCA Hb A1c Waived     Other   Restless legs    No meds doing ok      BMI 36.0-36.9,adult    Discuss wt loss diet      Hypercholesteremia    Discussed probably time to start a statin will observe cholesterol report.      Relevant Medications   benazepril (LOTENSIN) 10 MG tablet    Other Visit Diagnoses    Prostate cancer screening       Relevant Orders   PSA   Thyroid disorder screen       Relevant Orders   TSH       Follow up plan: Return in about 6 months (around 08/21/2017) for Hemoglobin A1c,  Lipids, ALT, AST.

## 2017-02-21 NOTE — Assessment & Plan Note (Signed)
Discussed probably time to start a statin will observe cholesterol report.

## 2017-02-21 NOTE — Assessment & Plan Note (Signed)
Discuss wt loss diet

## 2017-02-21 NOTE — Assessment & Plan Note (Addendum)
No meds doing ok

## 2017-02-22 ENCOUNTER — Encounter: Payer: Self-pay | Admitting: Family Medicine

## 2017-02-22 LAB — COMPREHENSIVE METABOLIC PANEL
A/G RATIO: 1.4 (ref 1.2–2.2)
ALK PHOS: 87 IU/L (ref 39–117)
ALT: 24 IU/L (ref 0–44)
AST: 27 IU/L (ref 0–40)
Albumin: 4.2 g/dL (ref 3.6–4.8)
BILIRUBIN TOTAL: 0.2 mg/dL (ref 0.0–1.2)
BUN/Creatinine Ratio: 22 (ref 10–24)
BUN: 18 mg/dL (ref 8–27)
CHLORIDE: 101 mmol/L (ref 96–106)
CO2: 19 mmol/L — ABNORMAL LOW (ref 20–29)
Calcium: 8.9 mg/dL (ref 8.6–10.2)
Creatinine, Ser: 0.83 mg/dL (ref 0.76–1.27)
GFR calc non Af Amer: 94 mL/min/{1.73_m2} (ref >59)
GFR, EST AFRICAN AMERICAN: 108 mL/min/{1.73_m2} (ref >59)
Globulin, Total: 2.9 g/dL (ref 1.5–4.5)
Glucose: 104 mg/dL — ABNORMAL HIGH (ref 65–99)
POTASSIUM: 4.3 mmol/L (ref 3.5–5.2)
Sodium: 136 mmol/L (ref 134–144)
Total Protein: 7.1 g/dL (ref 6.0–8.5)

## 2017-02-22 LAB — LIPID PANEL
Chol/HDL Ratio: 5.2 ratio — ABNORMAL HIGH (ref 0.0–5.0)
Cholesterol, Total: 204 mg/dL — ABNORMAL HIGH (ref 100–199)
HDL: 39 mg/dL — AB (ref >39)
LDL Calculated: 116 mg/dL — ABNORMAL HIGH (ref 0–99)
TRIGLYCERIDES: 244 mg/dL — AB (ref 0–149)
VLDL Cholesterol Cal: 49 mg/dL — ABNORMAL HIGH (ref 5–40)

## 2017-02-22 LAB — CBC WITH DIFFERENTIAL/PLATELET
BASOS: 0 %
Basophils Absolute: 0 10*3/uL (ref 0.0–0.2)
EOS (ABSOLUTE): 0.3 10*3/uL (ref 0.0–0.4)
Eos: 3 %
Hematocrit: 39.2 % (ref 37.5–51.0)
Hemoglobin: 13.8 g/dL (ref 13.0–17.7)
Immature Grans (Abs): 0 10*3/uL (ref 0.0–0.1)
Immature Granulocytes: 0 %
Lymphocytes Absolute: 2.5 10*3/uL (ref 0.7–3.1)
Lymphs: 24 %
MCH: 28.8 pg (ref 26.6–33.0)
MCHC: 35.2 g/dL (ref 31.5–35.7)
MCV: 82 fL (ref 79–97)
MONOS ABS: 0.9 10*3/uL (ref 0.1–0.9)
Monocytes: 9 %
NEUTROS ABS: 6.4 10*3/uL (ref 1.4–7.0)
Neutrophils: 64 %
PLATELETS: 213 10*3/uL (ref 150–379)
RBC: 4.8 x10E6/uL (ref 4.14–5.80)
RDW: 14.9 % (ref 12.3–15.4)
WBC: 10.1 10*3/uL (ref 3.4–10.8)

## 2017-02-22 LAB — TSH: TSH: 2.68 u[IU]/mL (ref 0.450–4.500)

## 2017-02-22 LAB — PSA: PROSTATE SPECIFIC AG, SERUM: 1.4 ng/mL (ref 0.0–4.0)

## 2017-02-27 ENCOUNTER — Encounter: Payer: Self-pay | Admitting: Family Medicine

## 2017-03-27 ENCOUNTER — Other Ambulatory Visit: Payer: Self-pay | Admitting: Unknown Physician Specialty

## 2017-03-27 DIAGNOSIS — M545 Low back pain, unspecified: Secondary | ICD-10-CM

## 2017-03-27 DIAGNOSIS — G8929 Other chronic pain: Secondary | ICD-10-CM

## 2017-05-20 LAB — HM DIABETES EYE EXAM

## 2017-06-24 ENCOUNTER — Encounter: Payer: Self-pay | Admitting: Family Medicine

## 2017-06-24 DIAGNOSIS — S46119A Strain of muscle, fascia and tendon of long head of biceps, unspecified arm, initial encounter: Secondary | ICD-10-CM | POA: Insufficient documentation

## 2017-06-24 DIAGNOSIS — M7552 Bursitis of left shoulder: Secondary | ICD-10-CM | POA: Insufficient documentation

## 2017-07-20 ENCOUNTER — Other Ambulatory Visit: Payer: Self-pay | Admitting: Unknown Physician Specialty

## 2017-07-20 DIAGNOSIS — M545 Low back pain, unspecified: Secondary | ICD-10-CM

## 2017-07-20 DIAGNOSIS — G8929 Other chronic pain: Secondary | ICD-10-CM

## 2017-08-12 ENCOUNTER — Ambulatory Visit: Payer: BC Managed Care – PPO | Admitting: Family Medicine

## 2017-08-12 ENCOUNTER — Encounter: Payer: Self-pay | Admitting: Family Medicine

## 2017-08-12 VITALS — BP 132/85 | HR 82 | Wt 249.0 lb

## 2017-08-12 DIAGNOSIS — I10 Essential (primary) hypertension: Secondary | ICD-10-CM

## 2017-08-12 DIAGNOSIS — E7800 Pure hypercholesterolemia, unspecified: Secondary | ICD-10-CM | POA: Diagnosis not present

## 2017-08-12 DIAGNOSIS — E119 Type 2 diabetes mellitus without complications: Secondary | ICD-10-CM | POA: Diagnosis not present

## 2017-08-12 LAB — LP+ALT+AST PICCOLO, WAIVED
ALT (SGPT) PICCOLO, WAIVED: 32 U/L (ref 10–47)
AST (SGOT) PICCOLO, WAIVED: 40 U/L — AB (ref 11–38)
Cholesterol Piccolo, Waived: 216 mg/dL — ABNORMAL HIGH (ref <200)
TRIGLYCERIDES PICCOLO,WAIVED: 493 mg/dL — AB (ref <150)

## 2017-08-12 LAB — BAYER DCA HB A1C WAIVED: HB A1C: 6.3 % (ref <7.0)

## 2017-08-12 MED ORDER — BENAZEPRIL HCL 20 MG PO TABS: 20.0000 mg | ORAL_TABLET | Freq: Every day | ORAL | 2 refills | Status: DC

## 2017-08-12 MED ORDER — ROSUVASTATIN CALCIUM 20 MG PO TABS: 20.0000 mg | ORAL_TABLET | Freq: Every day | ORAL | 2 refills | Status: DC

## 2017-08-12 NOTE — Assessment & Plan Note (Signed)
The current medical regimen is effective;  continue present plan and medications.  

## 2017-08-12 NOTE — Assessment & Plan Note (Signed)
Elevated cholesterol and triglycerides will start Crestor 20 mg 1 a day and recheck at physical this winter.

## 2017-08-12 NOTE — Progress Notes (Signed)
BP 132/85   Pulse 82   Wt 249 lb (112.9 kg)   SpO2 98%   BMI 37.31 kg/m    Subjective:    Patient ID: Robert Griffith, male    DOB: [birth year], 62 y.o.   MRN: [id number]  HPI: Robert Griffith is a 62 y.o. male  Chief Complaint  Patient presents with  . Follow-up  . Hypertension   Patient all in all doing well taking Benzapril 20 mg once a day for blood pressure with good control.  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 Patient's biggest concern is word finding issues having trouble with some memory is concerned about some memory loss.  Has not found himself getting lost or doing silly things.  Relevant past medical, surgical, family and social history reviewed and updated as indicated. Interim medical history since our last visit reviewed. Allergies and medications reviewed and updated.  Review of Systems  Constitutional: Negative.   Respiratory: Negative.   Cardiovascular: Negative.     Per HPI unless specifically indicated above     Objective:    BP 132/85   Pulse 82   Wt 249 lb (112.9 kg)   SpO2 98%   BMI 37.31 kg/m   Wt Readings from Last 3 Encounters:  08/12/17 249 lb (112.9 kg)  02/21/17 242 lb (109.8 kg)  09/19/16 235 lb 12.8 oz (107 kg)    Physical Exam  Constitutional: He is oriented to person, place, and time. He appears well-developed and well-nourished.  HENT:  Head: Normocephalic and atraumatic.  Eyes: Conjunctivae and EOM are normal.  Neck: Normal range of motion.  Cardiovascular: Normal rate, regular rhythm and normal heart sounds.  Pulmonary/Chest: Effort normal and breath sounds normal.  Musculoskeletal: Normal range of motion.  Neurological: He is alert and oriented to person, place, and time.  Mini-Mental status exam with score of 29 out of 30 with missed 1 of the 3 things to remember.  Skin: No erythema.  Psychiatric: He has a normal mood and affect. His  behavior is normal. Judgment and thought content normal.    Results for orders placed or performed in visit on 06/12/17  HM DIABETES EYE EXAM  Result Value Ref Range   HM Diabetic Eye Exam No Retinopathy No Retinopathy      Assessment & Plan:   Problem List Items Addressed This Visit      Cardiovascular and Mediastinum   Essential hypertension - Primary    The current medical regimen is effective;  continue present plan and medications.       Relevant Medications   benazepril (LOTENSIN) 20 MG tablet   rosuvastatin (CRESTOR) 20 MG tablet   Other Relevant Orders   Bayer DCA Hb A1c Waived   LP+ALT+AST Piccolo, Waived     Endocrine   Diabetes mellitus without complication ([place])    The current medical regimen is effective;  continue present plan and medications.       Relevant Medications   benazepril (LOTENSIN) 20 MG tablet   rosuvastatin (CRESTOR) 20 MG tablet   Other Relevant Orders   Bayer DCA Hb A1c Waived   LP+ALT+AST Piccolo, Waived     Other   Hypercholesteremia    Elevated cholesterol and triglycerides will start Crestor 20 mg 1 a day and recheck at physical this winter.      Relevant Medications   benazepril (LOTENSIN) 20 MG tablet  rosuvastatin (CRESTOR) 20 MG tablet   Other Relevant Orders   Bayer DCA Hb A1c Waived   LP+ALT+AST Piccolo, Waived       Follow up plan: Return in about 6 months (around 02/12/2018) for Physical Exam, Hemoglobin A1c.

## 2017-08-21 ENCOUNTER — Ambulatory Visit: Payer: BC Managed Care – PPO | Admitting: Family Medicine

## 2017-09-20 ENCOUNTER — Ambulatory Visit: Payer: Self-pay | Admitting: Family Medicine

## 2017-09-20 NOTE — Telephone Encounter (Signed)
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 No appointment available at PCP- advised UC- patient will comply.  Patient is going after work Reason for Disposition . [1] Can't move swollen joint at all AND [2] no fever  Answer Assessment - Initial Assessment Questions 1. LOCATION: "Where is the swelling located?"  (e.g., left, right, both knees)     Left knee 2. SIZE and DESCRIPTION: "What does the swelling look like?"  (e.g., entire knee, localized)     Inside knee swelling with heat 3. ONSET: "When did the swelling start?" "Does it come and go, or is it there all the time?"     Started yesterday - constant 4. PAIN: "Is there any pain?" If so, ask: "How bad is it?" (Scale 1-10; or mild, moderate, severe)     Yes- 4 5. SETTING: "Has there been any recent work, exercise or other activity that involved that part of the body?"      No injury to the knee- could have gotten something into knee- he is down on his knees at work- it has been sore for several months 6. AGGRAVATING FACTORS: "What makes the knee swelling worse?" (e.g., walking, climbing stairs, running)     No- putting weight on it 7. ASSOCIATED SYMPTOMS: "Is there any pain or redness?"     Turning pink 8. OTHER SYMPTOMS: "Do you have any other symptoms?" (e.g., chest pain, difficulty breathing, fever, calf pain)     No- fever last week 9. PREGNANCY: "Is there any chance you are pregnant?" "When was your last menstrual period?"     n/a  Protocols used: [facility]

## 2017-11-04 ENCOUNTER — Encounter: Payer: Self-pay | Admitting: Family Medicine

## 2017-11-12 ENCOUNTER — Other Ambulatory Visit: Payer: Self-pay | Admitting: Family Medicine

## 2017-11-12 NOTE — Telephone Encounter (Signed)
Requested Prescriptions  Pending Prescriptions Disp Refills  . benazepril (LOTENSIN) 10 MG tablet [Pharmacy Med Name: BENAZEPRIL HCL 10 MG TABLET] 90 tablet 2    Sig: TAKE 1 TABLET BY MOUTH EVERY DAY     Cardiovascular:  ACE Inhibitors Failed - 11/12/2017  1:40 AM      Failed - Cr in normal range and within 180 days    Creatinine  Date Value Ref Range Status  09/18/2012 0.78 0.60 - 1.30 mg/dL Final   Creatinine, Ser  Date Value Ref Range Status  02/21/2017 0.83 0.76 - 1.27 mg/dL Final         Failed - K in normal range and within 180 days    Potassium  Date Value Ref Range Status  02/21/2017 4.3 3.5 - 5.2 mmol/L Final  09/18/2012 3.9 3.5 - 5.1 mmol/L Final         Passed - Patient is not pregnant      Passed - Last BP in normal range    BP Readings from Last 1 Encounters:  08/12/17 132/85         Passed - Valid encounter within last 6 months    Recent Outpatient Visits          3 months ago Essential hypertension   [place] Crissman, Jeannette How, MD   8 months ago Essential hypertension   [place] Crissman, Jeannette How, MD   1 year ago Need for influenza vaccination   [facility] Kathrine Haddock, NP   1 year ago Right low back pain, unspecified chronicity, with sciatica presence unspecified   [facility] Kathrine Haddock, NP   1 year ago Essential hypertension   [place], Jeannette How, MD      Future Appointments            In 3 months Crissman, Jeannette How, MD [facility]

## 2017-11-15 ENCOUNTER — Other Ambulatory Visit: Payer: Self-pay | Admitting: Family Medicine

## 2017-11-15 DIAGNOSIS — G8929 Other chronic pain: Secondary | ICD-10-CM

## 2017-11-15 DIAGNOSIS — M545 Low back pain, unspecified: Secondary | ICD-10-CM

## 2018-02-07 ENCOUNTER — Other Ambulatory Visit: Payer: Self-pay | Admitting: Family Medicine

## 2018-02-07 DIAGNOSIS — G8929 Other chronic pain: Secondary | ICD-10-CM

## 2018-02-07 DIAGNOSIS — M545 Low back pain, unspecified: Secondary | ICD-10-CM

## 2018-02-07 NOTE — Telephone Encounter (Signed)
Requested Prescriptions  Pending Prescriptions Disp Refills  . meloxicam (MOBIC) 15 MG tablet [Pharmacy Med Name: MELOXICAM 15 MG TABLET] 90 tablet 0    Sig: TAKE 1 TABLET BY MOUTH EVERY DAY     Analgesics:  COX2 Inhibitors Passed - 02/07/2018  1:23 AM      Passed - HGB in normal range and within 360 days    Hemoglobin  Date Value Ref Range Status  02/21/2017 13.8 13.0 - 17.7 g/dL Final         Passed - Cr in normal range and within 360 days    Creatinine  Date Value Ref Range Status  09/18/2012 0.78 0.60 - 1.30 mg/dL Final   Creatinine, Ser  Date Value Ref Range Status  02/21/2017 0.83 0.76 - 1.27 mg/dL Final         Passed - Patient is not pregnant      Passed - Valid encounter within last 12 months    Recent Outpatient Visits          5 months ago Essential hypertension   [place] Crissman, Jeannette How, MD   11 months ago Essential hypertension   [place] Crissman, Jeannette How, MD   1 year ago Need for influenza vaccination   [facility] Kathrine Haddock, NP   1 year ago Right low back pain, unspecified chronicity, with sciatica presence unspecified   [facility] Kathrine Haddock, NP   1 year ago Essential hypertension   [place], Jeannette How, MD      Future Appointments            In 3 weeks Crissman, Jeannette How, MD [facility], [facility]

## 2018-02-24 ENCOUNTER — Encounter: Payer: BC Managed Care – PPO | Admitting: Family Medicine

## 2018-03-05 ENCOUNTER — Ambulatory Visit (INDEPENDENT_AMBULATORY_CARE_PROVIDER_SITE_OTHER): Payer: BC Managed Care – PPO | Admitting: Family Medicine

## 2018-03-05 ENCOUNTER — Encounter: Payer: Self-pay | Admitting: Family Medicine

## 2018-03-05 VITALS — BP 125/74 | HR 94 | Temp 98.5°F | Ht 68.0 in | Wt 251.2 lb

## 2018-03-05 DIAGNOSIS — E119 Type 2 diabetes mellitus without complications: Secondary | ICD-10-CM

## 2018-03-05 DIAGNOSIS — M545 Low back pain, unspecified: Secondary | ICD-10-CM

## 2018-03-05 DIAGNOSIS — I10 Essential (primary) hypertension: Secondary | ICD-10-CM

## 2018-03-05 DIAGNOSIS — E7800 Pure hypercholesterolemia, unspecified: Secondary | ICD-10-CM

## 2018-03-05 DIAGNOSIS — Z0000 Encounter for general adult medical examination without abnormal findings: Secondary | ICD-10-CM | POA: Diagnosis not present

## 2018-03-05 DIAGNOSIS — G8929 Other chronic pain: Secondary | ICD-10-CM

## 2018-03-05 LAB — URINALYSIS, ROUTINE W REFLEX MICROSCOPIC
Bilirubin, UA: NEGATIVE
Glucose, UA: NEGATIVE
Ketones, UA: NEGATIVE
LEUKOCYTES UA: NEGATIVE
Nitrite, UA: NEGATIVE
Protein, UA: NEGATIVE
Specific Gravity, UA: 1.015 (ref 1.005–1.030)
Urobilinogen, Ur: 0.2 mg/dL (ref 0.2–1.0)
pH, UA: 6.5 (ref 5.0–7.5)

## 2018-03-05 LAB — MICROSCOPIC EXAMINATION
Bacteria, UA: NONE SEEN
Epithelial Cells (non renal): NONE SEEN /hpf (ref 0–10)

## 2018-03-05 LAB — BAYER DCA HB A1C WAIVED: HB A1C (BAYER DCA - WAIVED): 6.5 % (ref <7.0)

## 2018-03-05 MED ORDER — MELOXICAM 15 MG PO TABS: 15.0000 mg | ORAL_TABLET | Freq: Every day | ORAL | 3 refills | Status: DC

## 2018-03-05 MED ORDER — ROSUVASTATIN CALCIUM 20 MG PO TABS: 20.0000 mg | ORAL_TABLET | Freq: Every day | ORAL | 4 refills | Status: DC

## 2018-03-05 MED ORDER — METFORMIN HCL 500 MG PO TABS: 500.0000 mg | ORAL_TABLET | Freq: Every day | ORAL | 4 refills | Status: DC

## 2018-03-05 MED ORDER — BENAZEPRIL HCL 20 MG PO TABS: 20.0000 mg | ORAL_TABLET | Freq: Every day | ORAL | 4 refills | Status: DC

## 2018-03-05 MED ORDER — AZITHROMYCIN 250 MG PO TABS: ORAL_TABLET | ORAL | 0 refills | Status: DC

## 2018-03-05 NOTE — Progress Notes (Signed)
BP 125/74   Pulse 94   Temp 98.5 F (36.9 C) (Oral)   Ht 5\' 8"  (1.727 m)   Wt 251 lb 3.2 oz (113.9 kg)   SpO2 97%   BMI 38.19 kg/m    Subjective:    Patient ID: Robert Griffith, male    DOB: [birth year], 63 y.o.   MRN: [id number]  HPI: Robert Griffith is a 63 y.o. male  Chief Complaint  Patient presents with  . Annual Exam  Patient ongoing with 2 to 3 weeks of some cough feeling bad is on second round of taking NyQuil may be is on the upswing and feeling little bit better over the last few days. So a lot of head congestion drainage. No problems with blood pressure taking medications without problems also taking cholesterol medicine without problems or issues. Takes meloxicam from time to time especially for her shoulder.   Relevant past medical, surgical, family and social history reviewed and updated as indicated. Interim medical history since our last visit reviewed. Allergies and medications reviewed and updated.  Review of Systems  Constitutional: Negative.   HENT: Negative.   Eyes: Negative.   Respiratory: Negative.   Cardiovascular: Negative.   Gastrointestinal: Negative.   Endocrine: Negative.   Genitourinary: Negative.   Musculoskeletal: Negative.   Skin: Negative.   Allergic/Immunologic: Negative.   Neurological: Negative.   Hematological: Negative.   Psychiatric/Behavioral: Negative.     Per HPI unless specifically indicated above     Objective:    BP 125/74   Pulse 94   Temp 98.5 F (36.9 C) (Oral)   Ht 5\' 8"  (1.727 m)   Wt 251 lb 3.2 oz (113.9 kg)   SpO2 97%   BMI 38.19 kg/m   Wt Readings from Last 3 Encounters:  03/05/18 251 lb 3.2 oz (113.9 kg)  08/12/17 249 lb (112.9 kg)  02/21/17 242 lb (109.8 kg)    Physical Exam Constitutional:      Appearance: He is well-developed.  HENT:     Head: Normocephalic and atraumatic.     Right Ear: External ear normal.     Left Ear: External ear normal.  Eyes:     Conjunctiva/sclera:  Conjunctivae normal.     Pupils: Pupils are equal, round, and reactive to light.  Neck:     Musculoskeletal: Normal range of motion and neck supple.  Cardiovascular:     Rate and Rhythm: Normal rate and regular rhythm.     Heart sounds: Normal heart sounds.  Pulmonary:     Effort: Pulmonary effort is normal.     Breath sounds: Normal breath sounds.  Abdominal:     General: Bowel sounds are normal.     Palpations: Abdomen is soft. There is no hepatomegaly or splenomegaly.  Genitourinary:    Penis: Normal.      Prostate: Normal.     Rectum: Normal.  Musculoskeletal: Normal range of motion.  Skin:    Findings: No erythema or rash.  Neurological:     Mental Status: He is alert and oriented to person, place, and time.     Deep Tendon Reflexes: Reflexes are normal and symmetric.  Psychiatric:        Behavior: Behavior normal.        Thought Content: Thought content normal.        Judgment: Judgment normal.     Results for orders placed or performed in visit on 08/12/17  Bayer DCA Hb A1c Waived  Result Value Ref  Range   HB A1C (BAYER DCA - WAIVED) 6.3 <7.0 %  LP+ALT+AST Piccolo, Waived  Result Value Ref Range   ALT (SGPT) Piccolo, Waived 32 10 - 47 U/L   AST (SGOT) Piccolo, Waived 40 (H) 11 - 38 U/L   Cholesterol Piccolo, Waived 216 (H) <200 mg/dL   HDL Chol Piccolo, Waived CANCELED    Triglycerides Piccolo,Waived 493 (H) <150 mg/dL      Assessment & Plan:   Problem List Items Addressed This Visit      Cardiovascular and Mediastinum   Essential hypertension    The current medical regimen is effective;  continue present plan and medications.         Endocrine   Diabetes mellitus without complication ([place]) - Primary    Discussed diabetes getting worse along with weight gain.  Patient will do better with diet is already joined [employer]. Discussed metformin will start metformin 500 at bedtime      Relevant Orders   Bayer DCA Hb A1c Waived     Other    Hypercholesteremia    The current medical regimen is effective;  continue present plan and medications.       Morbid obesity ([place])    Discussed diet nutrition weight loss       Other Visit Diagnoses    Routine general medical examination at a health care facility       Relevant Orders   CBC with Differential/Platelet   Comprehensive metabolic panel   Lipid panel   PSA   TSH   Urinalysis, Routine w reflex microscopic   Chronic left-sided low back pain without sciatica       Discuss back pain care and treatment stretching use of muscle relaxers will add meloxicam       Follow up plan: Return in about 6 months (around 09/05/2018) for Hemoglobin A1c, BMP,  Lipids, ALT, AST.

## 2018-03-05 NOTE — Assessment & Plan Note (Signed)
Discussed diet nutrition weight loss

## 2018-03-05 NOTE — Assessment & Plan Note (Signed)
Discussed diabetes getting worse along with weight gain.  Patient will do better with diet is already joined [employer]. Discussed metformin will start metformin 500 at bedtime

## 2018-03-05 NOTE — Assessment & Plan Note (Signed)
The current medical regimen is effective;  continue present plan and medications.  

## 2018-03-06 ENCOUNTER — Encounter: Payer: Self-pay | Admitting: Family Medicine

## 2018-03-06 LAB — CBC WITH DIFFERENTIAL/PLATELET
BASOS: 1 %
Basophils Absolute: 0.1 10*3/uL (ref 0.0–0.2)
EOS (ABSOLUTE): 0.4 10*3/uL (ref 0.0–0.4)
Eos: 4 %
HEMOGLOBIN: 14.9 g/dL (ref 13.0–17.7)
Hematocrit: 41.7 % (ref 37.5–51.0)
IMMATURE GRANULOCYTES: 0 %
Immature Grans (Abs): 0 10*3/uL (ref 0.0–0.1)
LYMPHS: 28 %
Lymphocytes Absolute: 2.2 10*3/uL (ref 0.7–3.1)
MCH: 30.5 pg (ref 26.6–33.0)
MCHC: 35.7 g/dL (ref 31.5–35.7)
MCV: 86 fL (ref 79–97)
Monocytes Absolute: 0.6 10*3/uL (ref 0.1–0.9)
Monocytes: 7 %
NEUTROS ABS: 4.8 10*3/uL (ref 1.4–7.0)
NEUTROS PCT: 60 %
PLATELETS: 184 10*3/uL (ref 150–450)
RBC: 4.88 x10E6/uL (ref 4.14–5.80)
RDW: 13 % (ref 11.6–15.4)
WBC: 8.1 10*3/uL (ref 3.4–10.8)

## 2018-03-06 LAB — COMPREHENSIVE METABOLIC PANEL
ALBUMIN: 4.1 g/dL (ref 3.8–4.8)
ALT: 26 IU/L (ref 0–44)
AST: 32 IU/L (ref 0–40)
Albumin/Globulin Ratio: 1.4 (ref 1.2–2.2)
Alkaline Phosphatase: 76 IU/L (ref 39–117)
BILIRUBIN TOTAL: 0.4 mg/dL (ref 0.0–1.2)
BUN / CREAT RATIO: 20 (ref 10–24)
BUN: 17 mg/dL (ref 8–27)
CALCIUM: 9.3 mg/dL (ref 8.6–10.2)
CHLORIDE: 99 mmol/L (ref 96–106)
CO2: 20 mmol/L (ref 20–29)
CREATININE: 0.86 mg/dL (ref 0.76–1.27)
GFR calc Af Amer: 106 mL/min/{1.73_m2} (ref >59)
GFR, EST NON AFRICAN AMERICAN: 92 mL/min/{1.73_m2} (ref >59)
Globulin, Total: 2.9 g/dL (ref 1.5–4.5)
Glucose: 176 mg/dL — ABNORMAL HIGH (ref 65–99)
Potassium: 4.1 mmol/L (ref 3.5–5.2)
Sodium: 136 mmol/L (ref 134–144)
TOTAL PROTEIN: 7 g/dL (ref 6.0–8.5)

## 2018-03-06 LAB — LIPID PANEL
Chol/HDL Ratio: 2.9 ratio (ref 0.0–5.0)
Cholesterol, Total: 112 mg/dL (ref 100–199)
HDL: 39 mg/dL — AB (ref >39)
LDL CALC: 46 mg/dL (ref 0–99)
Triglycerides: 137 mg/dL (ref 0–149)
VLDL CHOLESTEROL CAL: 27 mg/dL (ref 5–40)

## 2018-03-06 LAB — TSH: TSH: 2.71 u[IU]/mL (ref 0.450–4.500)

## 2018-03-06 LAB — PSA: Prostate Specific Ag, Serum: 1.3 ng/mL (ref 0.0–4.0)

## 2018-09-03 ENCOUNTER — Other Ambulatory Visit: Payer: Self-pay | Admitting: Family Medicine

## 2018-09-03 DIAGNOSIS — E119 Type 2 diabetes mellitus without complications: Secondary | ICD-10-CM

## 2018-09-03 DIAGNOSIS — I10 Essential (primary) hypertension: Secondary | ICD-10-CM

## 2018-09-03 DIAGNOSIS — E7800 Pure hypercholesterolemia, unspecified: Secondary | ICD-10-CM

## 2018-09-04 ENCOUNTER — Ambulatory Visit (INDEPENDENT_AMBULATORY_CARE_PROVIDER_SITE_OTHER): Payer: BC Managed Care – PPO | Admitting: Family Medicine

## 2018-09-04 ENCOUNTER — Other Ambulatory Visit: Payer: Self-pay

## 2018-09-04 ENCOUNTER — Encounter: Payer: Self-pay | Admitting: Family Medicine

## 2018-09-04 DIAGNOSIS — E1169 Type 2 diabetes mellitus with other specified complication: Secondary | ICD-10-CM

## 2018-09-04 DIAGNOSIS — E119 Type 2 diabetes mellitus without complications: Secondary | ICD-10-CM | POA: Diagnosis not present

## 2018-09-04 DIAGNOSIS — E7800 Pure hypercholesterolemia, unspecified: Secondary | ICD-10-CM

## 2018-09-04 DIAGNOSIS — I10 Essential (primary) hypertension: Secondary | ICD-10-CM | POA: Diagnosis not present

## 2018-09-04 LAB — LP+ALT+AST PICCOLO, WAIVED
ALT (SGPT) Piccolo, Waived: 40 U/L (ref 10–47)
AST (SGOT) Piccolo, Waived: 38 U/L (ref 11–38)
Chol/HDL Ratio Piccolo,Waive: 3 mg/dL
Cholesterol Piccolo, Waived: 120 mg/dL (ref <200)
HDL Chol Piccolo, Waived: 40 mg/dL — ABNORMAL LOW (ref >59)
LDL Chol Calc Piccolo Waived: 46 mg/dL (ref <100)
Triglycerides Piccolo,Waived: 169 mg/dL — ABNORMAL HIGH (ref <150)
VLDL Chol Calc Piccolo,Waive: 34 mg/dL — ABNORMAL HIGH (ref <30)

## 2018-09-04 LAB — BAYER DCA HB A1C WAIVED: HB A1C (BAYER DCA - WAIVED): 6.3 % (ref <7.0)

## 2018-09-04 MED ORDER — METFORMIN HCL 500 MG PO TABS: 1000.0000 mg | ORAL_TABLET | Freq: Two times a day (BID) | ORAL | 2 refills | Status: DC

## 2018-09-04 NOTE — Assessment & Plan Note (Signed)
The current medical regimen is effective;  continue present plan and medications.  

## 2018-09-04 NOTE — Assessment & Plan Note (Signed)
Discussed elevated blood sugars will increase metformin to 1000 twice daily discussed by 500/week recheck hemoglobin A1c 3 months

## 2018-09-04 NOTE — Progress Notes (Signed)
There were no vitals taken for this visit.   Subjective:    Patient ID: Robert Griffith, male    DOB: Dec 01, 1955, 63 y.o.   MRN: [id number]  HPI: Robert Griffith is a 63 y.o. male  Med check Discussed with patient blood sugars staying elevated around 200 whenever he checks it.  Otherwise doing well no complaints with blood pressure cholesterol medications taken faithfully with good control.  Relevant past medical, surgical, family and social history reviewed and updated as indicated. Interim medical history since our last visit reviewed. Allergies and medications reviewed and updated.  Review of Systems  Constitutional: Negative.   Respiratory: Negative.   Cardiovascular: Negative.     Per HPI unless specifically indicated above     Objective:    There were no vitals taken for this visit.  Wt Readings from Last 3 Encounters:  03/05/18 251 lb 3.2 oz (113.9 kg)  08/12/17 249 lb (112.9 kg)  02/21/17 242 lb (109.8 kg)    Physical Exam  Results for orders placed or performed in visit on 03/05/18  Microscopic Examination   URINE  Result Value Ref Range   WBC, UA 0-5 0 - 5 /hpf   RBC, UA 0-2 0 - 2 /hpf   Epithelial Cells (non renal) None seen 0 - 10 /hpf   Bacteria, UA None seen None seen/Few  Bayer DCA Hb A1c Waived  Result Value Ref Range   HB A1C (BAYER DCA - WAIVED) 6.5 <7.0 %  CBC with Differential/Platelet  Result Value Ref Range   WBC 8.1 3.4 - 10.8 x10E3/uL   RBC 4.88 4.14 - 5.80 x10E6/uL   Hemoglobin 14.9 13.0 - 17.7 g/dL   Hematocrit 41.7 37.5 - 51.0 %   MCV 86 79 - 97 fL   MCH 30.5 26.6 - 33.0 pg   MCHC 35.7 31.5 - 35.7 g/dL   RDW 13.0 11.6 - 15.4 %   Platelets 184 150 - 450 x10E3/uL   Neutrophils 60 Not Estab. %   Lymphs 28 Not Estab. %   Monocytes 7 Not Estab. %   Eos 4 Not Estab. %   Basos 1 Not Estab. %   Neutrophils Absolute 4.8 1.4 - 7.0 x10E3/uL   Lymphocytes Absolute 2.2 0.7 - 3.1 x10E3/uL   Monocytes Absolute 0.6 0.1 - 0.9 x10E3/uL   EOS (ABSOLUTE) 0.4 0.0 - 0.4 x10E3/uL   Basophils Absolute 0.1 0.0 - 0.2 x10E3/uL   Immature Granulocytes 0 Not Estab. %   Immature Grans (Abs) 0.0 0.0 - 0.1 x10E3/uL  Comprehensive metabolic panel  Result Value Ref Range   Glucose 176 (H) 65 - 99 mg/dL   BUN 17 8 - 27 mg/dL   Creatinine, Ser 0.86 0.76 - 1.27 mg/dL   GFR calc non Af Amer 92 >59 mL/min/1.73   GFR calc Af Amer 106 >59 mL/min/1.73   BUN/Creatinine Ratio 20 10 - 24   Sodium 136 134 - 144 mmol/L   Potassium 4.1 3.5 - 5.2 mmol/L   Chloride 99 96 - 106 mmol/L   CO2 20 20 - 29 mmol/L   Calcium 9.3 8.6 - 10.2 mg/dL   Total Protein 7.0 6.0 - 8.5 g/dL   Albumin 4.1 3.8 - 4.8 g/dL   Globulin, Total 2.9 1.5 - 4.5 g/dL   Albumin/Globulin Ratio 1.4 1.2 - 2.2   Bilirubin Total 0.4 0.0 - 1.2 mg/dL   Alkaline Phosphatase 76 39 - 117 IU/L   AST 32 0 - 40 IU/L  ALT 26 0 - 44 IU/L  Lipid panel  Result Value Ref Range   Cholesterol, Total 112 100 - 199 mg/dL   Triglycerides 137 0 - 149 mg/dL   HDL 39 (L) >39 mg/dL   VLDL Cholesterol Cal 27 5 - 40 mg/dL   LDL Calculated 46 0 - 99 mg/dL   Chol/HDL Ratio 2.9 0.0 - 5.0 ratio  PSA  Result Value Ref Range   Prostate Specific Ag, Serum 1.3 0.0 - 4.0 ng/mL  TSH  Result Value Ref Range   TSH 2.710 0.450 - 4.500 uIU/mL  Urinalysis, Routine w reflex microscopic  Result Value Ref Range   Specific Gravity, UA 1.015 1.005 - 1.030   pH, UA 6.5 5.0 - 7.5   Color, UA Yellow Yellow   Appearance Ur Clear Clear   Leukocytes, UA Negative Negative   Protein, UA Negative Negative/Trace   Glucose, UA Negative Negative   Ketones, UA Negative Negative   RBC, UA Trace (A) Negative   Bilirubin, UA Negative Negative   Urobilinogen, Ur 0.2 0.2 - 1.0 mg/dL   Nitrite, UA Negative Negative   Microscopic Examination See below:       Assessment & Plan:   Problem List Items Addressed This Visit      Cardiovascular and Mediastinum   Essential hypertension    The current medical regimen is  effective;  continue present plan and medications.         Endocrine   Diabetes mellitus associated with hormonal etiology ([place])    Discussed elevated blood sugars will increase metformin to 1000 twice daily discussed by 500/week recheck hemoglobin A1c 3 months      Relevant Medications   metFORMIN (GLUCOPHAGE) 500 MG tablet     Other   Hypercholesteremia    The current medical regimen is effective;  continue present plan and medications.           Follow up plan: Return in about 3 months (around 12/04/2018) for Hemoglobin A1c.

## 2018-09-05 ENCOUNTER — Encounter: Payer: Self-pay | Admitting: Family Medicine

## 2018-09-05 LAB — BASIC METABOLIC PANEL
BUN/Creatinine Ratio: 17 (ref 10–24)
BUN: 14 mg/dL (ref 8–27)
CO2: 24 mmol/L (ref 20–29)
Calcium: 9 mg/dL (ref 8.6–10.2)
Chloride: 102 mmol/L (ref 96–106)
Creatinine, Ser: 0.82 mg/dL (ref 0.76–1.27)
GFR calc Af Amer: 108 mL/min/{1.73_m2} (ref >59)
GFR calc non Af Amer: 93 mL/min/{1.73_m2} (ref >59)
Glucose: 109 mg/dL — ABNORMAL HIGH (ref 65–99)
Potassium: 4.8 mmol/L (ref 3.5–5.2)
Sodium: 139 mmol/L (ref 134–144)

## 2018-09-10 ENCOUNTER — Ambulatory Visit: Payer: BC Managed Care – PPO | Admitting: Family Medicine

## 2018-10-13 LAB — HM DIABETES EYE EXAM

## 2018-11-12 ENCOUNTER — Other Ambulatory Visit: Payer: Self-pay

## 2018-11-14 ENCOUNTER — Ambulatory Visit: Payer: BC Managed Care – PPO | Admitting: Family Medicine

## 2018-11-14 ENCOUNTER — Encounter: Payer: Self-pay | Admitting: Family Medicine

## 2018-11-14 ENCOUNTER — Other Ambulatory Visit: Payer: Self-pay

## 2018-11-14 VITALS — BP 110/72 | HR 70 | Temp 98.4°F

## 2018-11-14 DIAGNOSIS — Z23 Encounter for immunization: Secondary | ICD-10-CM

## 2018-11-14 DIAGNOSIS — E1169 Type 2 diabetes mellitus with other specified complication: Secondary | ICD-10-CM

## 2018-11-14 DIAGNOSIS — R61 Generalized hyperhidrosis: Secondary | ICD-10-CM

## 2018-11-14 LAB — BAYER DCA HB A1C WAIVED: HB A1C (BAYER DCA - WAIVED): 5.8 % (ref <7.0)

## 2018-11-14 NOTE — Progress Notes (Signed)
BP 110/72   Pulse 70   Temp 98.4 F (36.9 C) (Oral)   SpO2 97%    Subjective:    Patient ID: Robert Griffith, male    DOB: 1955/04/14, 63 y.o.   MRN: [id number]  HPI: Robert Griffith is a 63 y.o. male  Chief Complaint  Patient presents with  . Hyperglycemia    pt states he has been getting consistant BS readings between 180 and 225 for the last few months    Patient presenting today concerned about some elevated fasting BS readings he's been getting the past 2 months. Currently on metformin, takes faithfully without side effects. Trying to eat well, does not exercise regularly. No low blood sugar spells noted. States he does tend to have heavier dinners than his other meals.   He also notes several episodes of hot flashes and sweats that come over him from time to time the past few weeks. He once was working out in the garage, another time eating dinner, and another he can remember where he was just relaxing. Denies any associated SOB, CP, nausea, dizziness and states it'll pass in about a minute or so.   Relevant past medical, surgical, family and social history reviewed and updated as indicated. Interim medical history since our last visit reviewed. Allergies and medications reviewed and updated.  Review of Systems  Per HPI unless specifically indicated above     Objective:    BP 110/72   Pulse 70   Temp 98.4 F (36.9 C) (Oral)   SpO2 97%   Wt Readings from Last 3 Encounters:  03/05/18 251 lb 3.2 oz (113.9 kg)  08/12/17 249 lb (112.9 kg)  02/21/17 242 lb (109.8 kg)    Physical Exam Vitals signs and nursing note reviewed.  Constitutional:      Appearance: Normal appearance.  HENT:     Head: Atraumatic.  Eyes:     Extraocular Movements: Extraocular movements intact.     Conjunctiva/sclera: Conjunctivae normal.  Neck:     Musculoskeletal: Normal range of motion and neck supple.  Cardiovascular:     Rate and Rhythm: Normal rate and regular rhythm.  Pulmonary:      Effort: Pulmonary effort is normal.     Breath sounds: Normal breath sounds.  Musculoskeletal: Normal range of motion.  Skin:    General: Skin is warm and dry.  Neurological:     General: No focal deficit present.     Mental Status: He is oriented to person, place, and time.  Psychiatric:        Mood and Affect: Mood normal.        Thought Content: Thought content normal.        Judgment: Judgment normal.     Results for orders placed or performed in visit on 11/14/18  Bayer DCA Hb A1c Waived  Result Value Ref Range   HB A1C (BAYER DCA - WAIVED) 5.8 <7.0 %      Assessment & Plan:   Problem List Items Addressed This Visit      Endocrine   Diabetes mellitus associated with hormonal etiology ([place]) - Primary    A1C is 5.8 today, unclear if only his fasting sugars are elevated throughout day or if his monitor needs to be checked for accuracy. Discussed taking some random readings throughout day to see what those look like periodically and also working on lighter dinners to see about getting the morning sugars under better control. Will hold off adding medicine  at this time given his A1C to hopefully avoid dropping things too low at any point. Recheck in 3 months, sooner if having issues. Continue metformin regimen and working on lifestyle      Relevant Orders   Bayer DCA Hb A1c Waived (Completed)    Other Visit Diagnoses    Diaphoresis       Relevant Orders   EKG 12-Lead (Completed)   Immunization due       Relevant Orders   Varicella-zoster vaccine IM (Shingrix) (Completed)    25 minutes spent today in direct care and counseling with patient   Follow up plan: Return in about 3 months (around 02/14/2019) for DM.

## 2018-11-19 NOTE — Assessment & Plan Note (Signed)
A1C is 5.8 today, unclear if only his fasting sugars are elevated throughout day or if his monitor needs to be checked for accuracy. Discussed taking some random readings throughout day to see what those look like periodically and also working on lighter dinners to see about getting the morning sugars under better control. Will hold off adding medicine at this time given his A1C to hopefully avoid dropping things too low at any point. Recheck in 3 months, sooner if having issues. Continue metformin regimen and working on lifestyle

## 2018-12-08 ENCOUNTER — Ambulatory Visit: Payer: Self-pay | Admitting: Family Medicine

## 2019-02-20 ENCOUNTER — Telehealth (INDEPENDENT_AMBULATORY_CARE_PROVIDER_SITE_OTHER): Payer: BC Managed Care – PPO | Admitting: Family Medicine

## 2019-02-20 ENCOUNTER — Encounter: Payer: Self-pay | Admitting: Family Medicine

## 2019-02-20 ENCOUNTER — Ambulatory Visit: Payer: BC Managed Care – PPO | Admitting: Family Medicine

## 2019-02-20 VITALS — BP 133/76 | HR 69 | Ht 68.0 in | Wt 240.0 lb

## 2019-02-20 DIAGNOSIS — E1169 Type 2 diabetes mellitus with other specified complication: Secondary | ICD-10-CM | POA: Diagnosis not present

## 2019-02-20 NOTE — Progress Notes (Signed)
BP 133/76   Pulse 69   Ht 5\' 8"  (1.727 m)   Wt 240 lb (108.9 kg)   BMI 36.49 kg/m    Subjective:    Patient ID: Robert Griffith, male    DOB: 02-23-1955, 64 y.o.   MRN: [id number]  HPI: Robert Griffith is a 64 y.o. male  Chief Complaint  Patient presents with  . Diabetes    . This visit was completed via MyChart due to the restrictions of the COVID-19 pandemic. All issues as above were discussed and addressed. Physical exam was done as above through visual confirmation on MyChart. If it was felt that the patient should be evaluated in the office, they were directed there. The patient verbally consented to this visit. . Location of the patient: home . Location of the provider: work . Those involved with this call:  . Provider: Merrie Roof, PA-C . CMA: Lesle Chris, Beatrice . Front Desk/Registration: Jill Side  . Time spent on call: 15 minutes with patient face to face via video conference. More than 50% of this time was spent in counseling and coordination of care. 5 minutes total spent in review of patient's record and preparation of their chart. I verified patient identity using two factors (patient name and date of birth). Patient consents verbally to being seen via telemedicine visit today.   Here today for 3 month DM f/u. Home BSs have been around 140 fasting. He thinks his elevated readings last time were due to his meter being about to run out of batteries. Has not gotten high readings since changing batteries and has been feeling well. Denies low blood sugars, side effects to medicines.   Relevant past medical, surgical, family and social history reviewed and updated as indicated. Interim medical history since our last visit reviewed. Allergies and medications reviewed and updated.  Review of Systems  Per HPI unless specifically indicated above     Objective:    BP 133/76   Pulse 69   Ht 5\' 8"  (1.727 m)   Wt 240 lb (108.9 kg)   BMI 36.49 kg/m   Wt Readings  from Last 3 Encounters:  02/20/19 240 lb (108.9 kg)  03/05/18 251 lb 3.2 oz (113.9 kg)  08/12/17 249 lb (112.9 kg)    Physical Exam Vitals and nursing note reviewed.  Constitutional:      General: He is not in acute distress.    Appearance: Normal appearance.  HENT:     Head: Atraumatic.     Right Ear: External ear normal.     Left Ear: External ear normal.     Nose: Nose normal. No congestion.     Mouth/Throat:     Mouth: Mucous membranes are moist.     Pharynx: Oropharynx is clear.  Eyes:     Extraocular Movements: Extraocular movements intact.     Conjunctiva/sclera: Conjunctivae normal.  Pulmonary:     Effort: Pulmonary effort is normal. No respiratory distress.  Musculoskeletal:        General: Normal range of motion.     Cervical back: Normal range of motion.  Skin:    General: Skin is dry.     Findings: No erythema or rash.  Neurological:     Mental Status: He is oriented to person, place, and time.  Psychiatric:        Mood and Affect: Mood normal.        Thought Content: Thought content normal.  Judgment: Judgment normal.     Results for orders placed or performed in visit on 11/14/18  Bayer DCA Hb A1c Waived  Result Value Ref Range   HB A1C (BAYER DCA - WAIVED) 5.8 <7.0 %      Assessment & Plan:   Problem List Items Addressed This Visit      Endocrine   Diabetes mellitus associated with hormonal etiology ([place]) - Primary    Last A1C stable and WNL, and home BSs since have been under good control. Suspect meter inconsistency causing previous readings. Continue current regimen and will recheck all labs in 3 months          Follow up plan: Return in about 3 months (around 05/20/2019) for 6 month f/u.

## 2019-02-23 ENCOUNTER — Ambulatory Visit: Payer: BC Managed Care – PPO | Attending: Internal Medicine

## 2019-02-23 DIAGNOSIS — Z23 Encounter for immunization: Secondary | ICD-10-CM

## 2019-02-23 NOTE — Assessment & Plan Note (Signed)
Last A1C stable and WNL, and home BSs since have been under good control. Suspect meter inconsistency causing previous readings. Continue current regimen and will recheck all labs in 3 months

## 2019-02-23 NOTE — Progress Notes (Signed)
   Covid-19 Vaccination Clinic  Name:  YVON DECK    MRN: [id number] DOB: [birth year]  02/23/2019  Mr. Windholz was observed post Covid-19 immunization for 30 minutes based on pre-vaccination screening without incidence. He was provided with Vaccine Information Sheet and instruction to access the V-Safe system.   Mr. Paulik was instructed to call 911 with any severe reactions post vaccine: Marland Kitchen Difficulty breathing  . Swelling of your face and throat  . A fast heartbeat  . A bad rash all over your body  . Dizziness and weakness    Immunizations Administered    Name Date Dose VIS Date Route   Moderna COVID-19 Vaccine 02/23/2019  4:08 PM 0.5 mL 12/02/2018 Intramuscular   Manufacturer: Moderna   Lot: [id number]   Jeff [id number]

## 2019-03-24 ENCOUNTER — Ambulatory Visit: Payer: BC Managed Care – PPO | Attending: Internal Medicine

## 2019-03-24 DIAGNOSIS — Z23 Encounter for immunization: Secondary | ICD-10-CM

## 2019-03-24 NOTE — Progress Notes (Signed)
   Covid-19 Vaccination Clinic  Name:  Robert Griffith    MRN: [id number] DOB: August 10, 1955  03/24/2019  [name] was observed post Covid-19 immunization for 15 minutes without incident. He was provided with Vaccine Information Sheet and instruction to access the V-Safe system.   [name] was instructed to call 911 with any severe reactions post vaccine: Marland Kitchen Difficulty breathing  . Swelling of face and throat  . A fast heartbeat  . A bad rash all over body  . Dizziness and weakness   Immunizations Administered    Name Date Dose VIS Date Route   Moderna COVID-19 Vaccine 03/24/2019  4:07 PM 0.5 mL 12/02/2018 Intramuscular   Manufacturer: Moderna   Lot: [id number]   [id number]

## 2019-03-26 ENCOUNTER — Other Ambulatory Visit: Payer: Self-pay

## 2019-03-26 DIAGNOSIS — G8929 Other chronic pain: Secondary | ICD-10-CM

## 2019-03-26 DIAGNOSIS — E119 Type 2 diabetes mellitus without complications: Secondary | ICD-10-CM

## 2019-03-26 MED ORDER — METFORMIN HCL 500 MG PO TABS: 1000.0000 mg | ORAL_TABLET | Freq: Two times a day (BID) | ORAL | 1 refills | Status: DC

## 2019-03-26 MED ORDER — MELOXICAM 15 MG PO TABS: 15.0000 mg | ORAL_TABLET | Freq: Every day | ORAL | 1 refills | Status: DC

## 2019-03-26 NOTE — Telephone Encounter (Signed)
Refill request for Meloxicam, Metformin LOV: 02/20/2019 Next Appt: 05/20/2019

## 2019-04-20 ENCOUNTER — Other Ambulatory Visit: Payer: Self-pay

## 2019-04-20 MED ORDER — BENAZEPRIL HCL 20 MG PO TABS: 20.0000 mg | ORAL_TABLET | Freq: Every day | ORAL | 0 refills | Status: DC

## 2019-04-20 NOTE — Telephone Encounter (Signed)
Patient last seen 02/20/19 and has appointment 05/20/19

## 2019-05-08 ENCOUNTER — Other Ambulatory Visit: Payer: Self-pay

## 2019-05-08 DIAGNOSIS — E7800 Pure hypercholesterolemia, unspecified: Secondary | ICD-10-CM

## 2019-05-08 MED ORDER — ROSUVASTATIN CALCIUM 20 MG PO TABS: 20.0000 mg | ORAL_TABLET | Freq: Every day | ORAL | 0 refills | Status: DC

## 2019-05-20 ENCOUNTER — Encounter: Payer: Self-pay | Admitting: Family Medicine

## 2019-05-20 ENCOUNTER — Other Ambulatory Visit: Payer: Self-pay

## 2019-05-20 ENCOUNTER — Ambulatory Visit: Payer: BC Managed Care – PPO | Admitting: Family Medicine

## 2019-05-20 VITALS — BP 132/82 | HR 75 | Temp 98.8°F | Wt 254.0 lb

## 2019-05-20 DIAGNOSIS — I10 Essential (primary) hypertension: Secondary | ICD-10-CM | POA: Diagnosis not present

## 2019-05-20 DIAGNOSIS — Z23 Encounter for immunization: Secondary | ICD-10-CM | POA: Diagnosis not present

## 2019-05-20 DIAGNOSIS — E119 Type 2 diabetes mellitus without complications: Secondary | ICD-10-CM

## 2019-05-20 DIAGNOSIS — E1169 Type 2 diabetes mellitus with other specified complication: Secondary | ICD-10-CM | POA: Diagnosis not present

## 2019-05-20 DIAGNOSIS — E7800 Pure hypercholesterolemia, unspecified: Secondary | ICD-10-CM

## 2019-05-20 DIAGNOSIS — Z125 Encounter for screening for malignant neoplasm of prostate: Secondary | ICD-10-CM | POA: Diagnosis not present

## 2019-05-20 MED ORDER — ROSUVASTATIN CALCIUM 20 MG PO TABS: 20.0000 mg | ORAL_TABLET | Freq: Every day | ORAL | 1 refills | Status: DC

## 2019-05-20 MED ORDER — BENAZEPRIL HCL 20 MG PO TABS: 20.0000 mg | ORAL_TABLET | Freq: Every day | ORAL | 1 refills | Status: DC

## 2019-05-20 NOTE — Progress Notes (Signed)
BP 132/82   Pulse 75   Temp 98.8 F (37.1 C) (Oral)   Wt 254 lb (115.2 kg)   SpO2 97%   BMI 38.62 kg/m    Subjective:    Patient ID: Robert Griffith, male    DOB: Jan 28, 1955, 64 y.o.   MRN: [id number]  HPI: Robert Griffith is a 64 y.o. male  Chief Complaint  Patient presents with  . Diabetes  . Hypertension  . Hyperlipidemia  . Labs Only    pt would like to check for PSA    Here today for 6 month f/u chronic conditions.   Not checking home BPs. Taking medicines faithfully wihtout side effects, CP, SOB, HAs, dizziness. Trying to eat well and stay active.   Home BSs - running overall  In the 120s but for a while there crept up to 190s range. Trying to start walking and eating better. Does note he sometimes misses his evening dose of metformin but overall tries to take faithfully.   Requesting PSA recheck as it's been a year since last check and it's something he wants to be annually watching. Low risk, no new sxs but a friend of his was recently diagnosed with prostate cancer so he's worried about it.  HLD - crestor daily without side effects. Denies claudication, myalgias. CHA2DS2-VASc Score = 3  The patient's score is based upon: CHF History: 0 HTN History: 1 Age : 1 Diabetes History: 1 Stroke History: 0 Vascular Disease History: 0 Gender: 0  {  Relevant past medical, surgical, family and social history reviewed and updated as indicated. Interim medical history since our last visit reviewed. Allergies and medications reviewed and updated.  Review of Systems  Per HPI unless specifically indicated above     Objective:    BP 132/82   Pulse 75   Temp 98.8 F (37.1 C) (Oral)   Wt 254 lb (115.2 kg)   SpO2 97%   BMI 38.62 kg/m   Wt Readings from Last 3 Encounters:  05/20/19 254 lb (115.2 kg)  02/20/19 240 lb (108.9 kg)  03/05/18 251 lb 3.2 oz (113.9 kg)    Physical Exam Vitals and nursing note reviewed.  Constitutional:      Appearance: Normal  appearance.  HENT:     Head: Atraumatic.  Eyes:     Extraocular Movements: Extraocular movements intact.     Conjunctiva/sclera: Conjunctivae normal.  Cardiovascular:     Rate and Rhythm: Normal rate and regular rhythm.  Pulmonary:     Effort: Pulmonary effort is normal.     Breath sounds: Normal breath sounds.  Musculoskeletal:        General: Normal range of motion.     Cervical back: Normal range of motion and neck supple.  Skin:    General: Skin is warm and dry.  Neurological:     General: No focal deficit present.     Mental Status: He is oriented to person, place, and time.  Psychiatric:        Mood and Affect: Mood normal.        Thought Content: Thought content normal.        Judgment: Judgment normal.     Results for orders placed or performed in visit on 11/14/18  Bayer DCA Hb A1c Waived  Result Value Ref Range   HB A1C (BAYER DCA - WAIVED) 5.8 <7.0 %      Assessment & Plan:   Problem List Items Addressed This Visit  Cardiovascular and Mediastinum   Essential hypertension - Primary    BPs stable and under good control, continue current regimen      Relevant Medications   rosuvastatin (CRESTOR) 20 MG tablet   benazepril (LOTENSIN) 20 MG tablet   Other Relevant Orders   Comprehensive metabolic panel     Endocrine   Diabetes mellitus associated with hormonal etiology ([place])    Recheck A1C, adjust as needed. Continue current regimen      Relevant Medications   rosuvastatin (CRESTOR) 20 MG tablet   benazepril (LOTENSIN) 20 MG tablet   Other Relevant Orders   HgB A1c     Other   Hypercholesteremia    Recheck lipids, continue current regimen and lifestyle modifications      Relevant Medications   rosuvastatin (CRESTOR) 20 MG tablet   benazepril (LOTENSIN) 20 MG tablet   Other Relevant Orders   Lipid Panel w/o Chol/HDL Ratio    Other Visit Diagnoses    Screening for prostate cancer       Relevant Orders   PSA   Diabetes mellitus without  complication ([facility])       Relevant Medications   rosuvastatin (CRESTOR) 20 MG tablet   benazepril (LOTENSIN) 20 MG tablet   Need for pneumococcal vaccine       Relevant Orders   Pneumococcal conjugate vaccine 13-valent IM (Completed)       Follow up plan: Return in about 6 months (around 11/20/2019) for CPE.

## 2019-05-21 LAB — LIPID PANEL W/O CHOL/HDL RATIO
Cholesterol, Total: 126 mg/dL (ref 100–199)
HDL: 41 mg/dL (ref >39)
LDL Chol Calc (NIH): 52 mg/dL (ref 0–99)
Triglycerides: 207 mg/dL — ABNORMAL HIGH (ref 0–149)
VLDL Cholesterol Cal: 33 mg/dL (ref 5–40)

## 2019-05-21 LAB — PSA: Prostate Specific Ag, Serum: 1.2 ng/mL (ref 0.0–4.0)

## 2019-05-21 LAB — COMPREHENSIVE METABOLIC PANEL
ALT: 30 IU/L (ref 0–44)
AST: 29 IU/L (ref 0–40)
Albumin/Globulin Ratio: 1.5 (ref 1.2–2.2)
Albumin: 4.3 g/dL (ref 3.8–4.8)
Alkaline Phosphatase: 63 IU/L (ref 48–121)
BUN/Creatinine Ratio: 23 (ref 10–24)
BUN: 19 mg/dL (ref 8–27)
Bilirubin Total: 0.2 mg/dL (ref 0.0–1.2)
CO2: 23 mmol/L (ref 20–29)
Calcium: 9.3 mg/dL (ref 8.6–10.2)
Chloride: 101 mmol/L (ref 96–106)
Creatinine, Ser: 0.82 mg/dL (ref 0.76–1.27)
GFR calc Af Amer: 107 mL/min/{1.73_m2} (ref >59)
GFR calc non Af Amer: 93 mL/min/{1.73_m2} (ref >59)
Globulin, Total: 2.9 g/dL (ref 1.5–4.5)
Glucose: 112 mg/dL — ABNORMAL HIGH (ref 65–99)
Potassium: 4.3 mmol/L (ref 3.5–5.2)
Sodium: 137 mmol/L (ref 134–144)
Total Protein: 7.2 g/dL (ref 6.0–8.5)

## 2019-05-21 LAB — HEMOGLOBIN A1C
Est. average glucose Bld gHb Est-mCnc: 134 mg/dL
Hgb A1c MFr Bld: 6.3 % — ABNORMAL HIGH (ref 4.8–5.6)

## 2019-05-21 NOTE — Assessment & Plan Note (Signed)
Recheck A1C, adjust as needed. Continue current regimen. 

## 2019-05-21 NOTE — Assessment & Plan Note (Signed)
BPs stable and under good control, continue current regimen 

## 2019-05-21 NOTE — Assessment & Plan Note (Signed)
Recheck lipids, continue current regimen and lifestyle modifications

## 2019-07-17 ENCOUNTER — Other Ambulatory Visit: Payer: Self-pay

## 2019-07-17 ENCOUNTER — Encounter: Payer: Self-pay | Admitting: Family Medicine

## 2019-07-17 ENCOUNTER — Ambulatory Visit: Payer: BC Managed Care – PPO | Admitting: Family Medicine

## 2019-07-17 VITALS — BP 114/81 | HR 54 | Temp 98.3°F | Wt 253.0 lb

## 2019-07-17 DIAGNOSIS — R21 Rash and other nonspecific skin eruption: Secondary | ICD-10-CM | POA: Diagnosis not present

## 2019-07-17 MED ORDER — PREDNISONE 10 MG PO TABS
ORAL_TABLET | ORAL | 0 refills | Status: DC
Start: 2019-07-17 — End: 2019-11-30

## 2019-07-17 MED ORDER — TRIAMCINOLONE ACETONIDE 0.1 % EX CREA
1.0000 | TOPICAL_CREAM | Freq: Two times a day (BID) | CUTANEOUS | 0 refills | Status: DC
Start: 2019-07-17 — End: 2019-11-30

## 2019-07-17 NOTE — Progress Notes (Signed)
BP 114/81   Pulse (!) 54   Temp 98.3 F (36.8 C) (Oral)   Wt 253 lb (114.8 kg)   SpO2 96%   BMI 38.47 kg/m    Subjective:    Patient ID: Robert Griffith, male    DOB: [birth year], 64 y.o.   MRN: [id number]  HPI: Robert Griffith is a 64 y.o. male  Chief Complaint  Patient presents with  . Rash    on abdomen, back an darms since Tuesday   Patient presenting today with 3 days of itchy red patchy rash on trunk. Spreading to new areas over time. Seemed to start after he was taking down an old deck with lots of plants growing up onto it. Denies any notice of tick bite or insect bites. No other sxs at this time. Not trying anything for sxs.   Relevant past medical, surgical, family and social history reviewed and updated as indicated. Interim medical history since our last visit reviewed. Allergies and medications reviewed and updated.  Review of Systems  Per HPI unless specifically indicated above     Objective:    BP 114/81   Pulse (!) 54   Temp 98.3 F (36.8 C) (Oral)   Wt 253 lb (114.8 kg)   SpO2 96%   BMI 38.47 kg/m   Wt Readings from Last 3 Encounters:  07/17/19 253 lb (114.8 kg)  05/20/19 254 lb (115.2 kg)  02/20/19 240 lb (108.9 kg)    Physical Exam Vitals and nursing note reviewed.  Constitutional:      Appearance: Normal appearance.  HENT:     Head: Atraumatic.  Eyes:     Extraocular Movements: Extraocular movements intact.     Conjunctiva/sclera: Conjunctivae normal.  Cardiovascular:     Rate and Rhythm: Normal rate and regular rhythm.  Pulmonary:     Effort: Pulmonary effort is normal.     Breath sounds: Normal breath sounds.  Musculoskeletal:        General: Normal range of motion.     Cervical back: Normal range of motion and neck supple.  Skin:    General: Skin is warm and dry.     Findings: Rash (erythematous maculopapular rash with some blistering - mainly on abdomen and back) present.  Neurological:     General: No focal deficit  present.     Mental Status: He is oriented to person, place, and time.  Psychiatric:        Mood and Affect: Mood normal.        Thought Content: Thought content normal.        Judgment: Judgment normal.     Results for orders placed or performed in visit on 05/20/19  PSA  Result Value Ref Range   Prostate Specific Ag, Serum 1.2 0.0 - 4.0 ng/mL  Comprehensive metabolic panel  Result Value Ref Range   Glucose 112 (H) 65 - 99 mg/dL   BUN 19 8 - 27 mg/dL   Creatinine, Ser 0.82 0.76 - 1.27 mg/dL   GFR calc non Af Amer 93 >59 mL/min/1.73   GFR calc Af Amer 107 >59 mL/min/1.73   BUN/Creatinine Ratio 23 10 - 24   Sodium 137 134 - 144 mmol/L   Potassium 4.3 3.5 - 5.2 mmol/L   Chloride 101 96 - 106 mmol/L   CO2 23 20 - 29 mmol/L   Calcium 9.3 8.6 - 10.2 mg/dL   Total Protein 7.2 6.0 - 8.5 g/dL   Albumin 4.3 3.8 - 4.8  g/dL   Globulin, Total 2.9 1.5 - 4.5 g/dL   Albumin/Globulin Ratio 1.5 1.2 - 2.2   Bilirubin Total 0.2 0.0 - 1.2 mg/dL   Alkaline Phosphatase 63 48 - 121 IU/L   AST 29 0 - 40 IU/L   ALT 30 0 - 44 IU/L  Lipid Panel w/o Chol/HDL Ratio  Result Value Ref Range   Cholesterol, Total 126 100 - 199 mg/dL   Triglycerides 207 (H) 0 - 149 mg/dL   HDL 41 >39 mg/dL   VLDL Cholesterol Cal 33 5 - 40 mg/dL   LDL Chol Calc (NIH) 52 0 - 99 mg/dL  HgB A1c  Result Value Ref Range   Hgb A1c MFr Bld 6.3 (H) 4.8 - 5.6 %   Est. average glucose Bld gHb Est-mCnc 134 mg/dL      Assessment & Plan:   Problem List Items Addressed This Visit    None    Visit Diagnoses    Rash    -  Primary   Consistent with poison ivy or hives, tx with triamcinolone cream prn, prednisone taper, antihistamines. F/u if not resolving       Follow up plan: Return if symptoms worsen or fail to improve.

## 2019-09-24 ENCOUNTER — Other Ambulatory Visit: Payer: Self-pay | Admitting: Family Medicine

## 2019-09-24 DIAGNOSIS — M545 Low back pain, unspecified: Secondary | ICD-10-CM

## 2019-09-24 NOTE — Telephone Encounter (Signed)
Routing to provider  

## 2019-09-24 NOTE — Telephone Encounter (Signed)
Requested  medications are  due for refill today yes  Requested medications are on the active medication list yes  Last refill 06/23/19   Future visit scheduled no, canceled  Notes to clinic Do not see this med/dx discussed in a visit within 12 months, please assess.

## 2019-10-09 ENCOUNTER — Other Ambulatory Visit: Payer: Self-pay | Admitting: Family Medicine

## 2019-10-09 DIAGNOSIS — E119 Type 2 diabetes mellitus without complications: Secondary | ICD-10-CM

## 2019-11-30 ENCOUNTER — Encounter: Payer: BC Managed Care – PPO | Admitting: Family Medicine

## 2019-11-30 ENCOUNTER — Ambulatory Visit (INDEPENDENT_AMBULATORY_CARE_PROVIDER_SITE_OTHER): Payer: BC Managed Care – PPO | Admitting: Nurse Practitioner

## 2019-11-30 ENCOUNTER — Other Ambulatory Visit: Payer: Self-pay

## 2019-11-30 ENCOUNTER — Encounter: Payer: Self-pay | Admitting: Nurse Practitioner

## 2019-11-30 VITALS — BP 142/85 | HR 76 | Temp 98.2°F | Ht 68.03 in | Wt 253.2 lb

## 2019-11-30 DIAGNOSIS — E1169 Type 2 diabetes mellitus with other specified complication: Secondary | ICD-10-CM

## 2019-11-30 DIAGNOSIS — Z0000 Encounter for general adult medical examination without abnormal findings: Secondary | ICD-10-CM

## 2019-11-30 DIAGNOSIS — Z6838 Body mass index (BMI) 38.0-38.9, adult: Secondary | ICD-10-CM

## 2019-11-30 DIAGNOSIS — M25532 Pain in left wrist: Secondary | ICD-10-CM | POA: Diagnosis not present

## 2019-11-30 DIAGNOSIS — E7800 Pure hypercholesterolemia, unspecified: Secondary | ICD-10-CM

## 2019-11-30 DIAGNOSIS — Z1211 Encounter for screening for malignant neoplasm of colon: Secondary | ICD-10-CM

## 2019-11-30 DIAGNOSIS — Z23 Encounter for immunization: Secondary | ICD-10-CM

## 2019-11-30 DIAGNOSIS — I10 Essential (primary) hypertension: Secondary | ICD-10-CM

## 2019-11-30 DIAGNOSIS — Z1329 Encounter for screening for other suspected endocrine disorder: Secondary | ICD-10-CM | POA: Diagnosis not present

## 2019-11-30 MED ORDER — ROSUVASTATIN CALCIUM 20 MG PO TABS: 20.0000 mg | ORAL_TABLET | Freq: Every day | ORAL | 1 refills | Status: DC

## 2019-11-30 MED ORDER — CETIRIZINE HCL 10 MG PO TABS
10.0000 mg | ORAL_TABLET | Freq: Every day | ORAL | 2 refills | Status: DC
Start: 2019-11-30 — End: 2020-09-28

## 2019-11-30 MED ORDER — BENAZEPRIL HCL 40 MG PO TABS: 40.0000 mg | ORAL_TABLET | Freq: Every day | ORAL | 0 refills | Status: DC

## 2019-11-30 NOTE — Patient Instructions (Addendum)
[facility]: [address], [place], [place] [address]  RICE Therapy for Routine Care of Injuries Many injuries can be cared for with rest, ice, compression, and elevation (RICE therapy). This includes:  Resting the injured part.  Putting ice on the injury.  Putting pressure (compression) on the injury.  Raising the injured part (elevation). Using RICE therapy can help to lessen pain and swelling. Supplies needed:  Ice.  Plastic bag.  Towel.  Elastic bandage.  Pillow or pillows to raise (elevate) your injured body part. How to care for your injury with RICE therapy Rest Limit your normal activities, and try not to use the injured part of your body. You can go back to your normal activities when your doctor says it is okay to do them and you feel okay. Ask your doctor if you should do exercises to help your injury get better. Ice Put ice on the injured area. Do not put ice on your bare skin.  Put ice in a plastic bag.  Place a towel between your skin and the bag.  Leave the ice on for 20 minutes, 2-3 times a day. Use ice on as many days as told by your doctor.  Compression Compression means putting pressure on the injured area. This can be done with an elastic bandage. If an elastic bandage has been put on your injury:  Do not wrap the bandage too tight. Wrap the bandage more loosely if part of your body away from the bandage is blue, swollen, cold, painful, or loses feeling (gets numb).  Take off the bandage and put it on again. Do this every 3-4 hours or as told by your doctor.  See your doctor if the bandage seems to make your problems worse.  Elevation Elevation means keeping the injured area raised. If you can, raise the injured area above your heart or the center of your chest. Contact a doctor if:  You keep having pain and swelling.  Your symptoms get worse. Get help right away if:  You have sudden bad pain at your injury or lower than your  injury.  You have redness or more swelling around your injury.  You have tingling or numbness at your injury or lower than your injury, and it does not go away when you take off the bandage. Summary  Many injuries can be cared for using rest, ice, compression, and elevation (RICE therapy).  You can go back to your normal activities when you feel okay and your doctor says it is okay.  Put ice on the injured area as told by your doctor.  Get help if your symptoms get worse or if you keep having pain and swelling. This information is not intended to replace advice given to you by your health care provider. Make sure you discuss any questions you have with your health care provider. Document Revised: 09/07/2016 Document Reviewed: 09/07/2016 Elsevier Patient Education  [place].

## 2019-11-30 NOTE — Progress Notes (Signed)
BP (!) 142/85    Pulse 76    Temp 98.2 F (36.8 C)    Ht 5' 8.03" (1.728 m)    Wt 253 lb 4 oz (114.9 kg)    SpO2 98%    BMI 38.47 kg/m    Subjective:    Patient ID: Robert Griffith, male    DOB: 06-05-1955, 64 y.o.   MRN: [id number]  HPI: HARVIE Griffith is a 64 y.o. male presenting on 11/30/2019 for comprehensive medical examination. Current medical complaints include:  WRIST PAIN  Reports wrist pain started last week.  Did not fall prior to wrist pain, seemed to start when he woke up.  Pain is in dominant wrist and he works in maintenance. Duration: weeks Involved wrist: left Mechanism of injury:  no trauma Location: dorsal Onset: sudden Severity: severe  Quality: tingling/numbess down pinky finger Frequency:comes and goes with movement Radiation: yes Aggravating factors: movement, laying on it  Alleviating factors: nothing  Status: stable Treatments attempted: nothing Relief with NSAIDs?:  No NSAIDs Taken Weakness: yes Numbness: yes; ulnar nerve Redness: yes Bruising: no Swelling: yes Fevers: no  He currently lives with: wife Interim Problems from his last visit: no  Depression Screen done today and results listed below:  Depression screen [facility] 2/9 05/20/2019 03/05/2018 08/12/2017 02/21/2017 08/13/2016  Decreased Interest 0 0 0 0 0  Down, Depressed, Hopeless 0 0 0 0 0  PHQ - 2 Score 0 0 0 0 0  Altered sleeping 0 0 - - -  Tired, decreased energy 0 1 - - -  Change in appetite 0 0 - - -  Feeling bad or failure about yourself  0 0 - - -  Trouble concentrating 0 0 - - -  Moving slowly or fidgety/restless 0 0 - - -  Suicidal thoughts 0 0 - - -  PHQ-9 Score 0 1 - - -  Difficult doing work/chores - Not difficult at all - - -    The patient does not have a history of falls. I did not complete a risk assessment for falls. A plan of care for falls was not documented.   Past Medical History:  Past Medical History:  Diagnosis Date   Diabetes mellitus without  complication ([place])    Kidney stone    Obesity    Restless legs     Surgical History:  Past Surgical History:  Procedure Laterality Date   left toe surgery     sleep apnea surgery     TONSILLECTOMY AND ADENOIDECTOMY      Medications:  Current Outpatient Medications on File Prior to Visit  Medication Sig   aspirin 81 MG EC tablet Take 81 mg by mouth daily.     meloxicam (MOBIC) 15 MG tablet TAKE 1 TABLET BY MOUTH EVERY DAY (Patient taking differently: 4 (four) times a week. )   metFORMIN (GLUCOPHAGE) 500 MG tablet TAKE 2 TABLETS (1,000 MG TOTAL) BY MOUTH 2 (TWO) TIMES DAILY WITH A MEAL.   ONE TOUCH ULTRA TEST test strip USE TO TEST BLOOD SUGAR DAILY (EX E11.9)   No current facility-administered medications on file prior to visit.    Allergies:  Allergies  Allergen Reactions   Other Shortness Of Breath    Bananas: indigestion    Social History:  Social History   Socioeconomic History   Marital status: Married    Spouse name: Not on file   Number of children: Not on file   Years of education: Not on  file   Highest education level: Not on file  Occupational History   Not on file  Tobacco Use   Smoking status: Never Smoker   Smokeless tobacco: Never Used  Vaping Use   Vaping Use: Never used  Substance and Sexual Activity   Alcohol use: No   Drug use: No   Sexual activity: Not on file  Other Topics Concern   Not on file  Social History Narrative   Not on file   Social Determinants of Health   Financial Resource Strain:    Difficulty of Paying Living Expenses: Not on file  Food Insecurity:    Worried About [place] in the Last Year: Not on file   Ran Out of Food in the Last Year: Not on file  Transportation Needs:    Lack of Transportation (Medical): Not on file   Lack of Transportation (Non-Medical): Not on file  Physical Activity:    Days of Exercise per Week: Not on file   Minutes of Exercise per Session: Not on  file  Stress:    Feeling of Stress : Not on file  Social Connections:    Frequency of Communication with Friends and Family: Not on file   Frequency of Social Gatherings with Friends and Family: Not on file   Attends Religious Services: Not on file   Active Member of Clubs or Organizations: Not on file   Attends Archivist Meetings: Not on file   Marital Status: Not on file  Intimate Partner Violence:    Fear of Current or Ex-Partner: Not on file   Emotionally Abused: Not on file   Physically Abused: Not on file   Sexually Abused: Not on file   Social History   Tobacco Use  Smoking Status Never Smoker  Smokeless Tobacco Never Used   Social History   Substance and Sexual Activity  Alcohol Use No    Family History:  Family History  Problem Relation Age of Onset   Heart attack Mother    Heart disease Mother    Heart disease Father    Stroke Brother    Coronary artery disease Other        4 family members    Past medical history, surgical history, medications, allergies, family history and social history reviewed with patient today and changes made to appropriate areas of the chart.   Review of Systems  Constitutional: Negative.   HENT: Positive for congestion and tinnitus. Negative for ear discharge, ear pain, hearing loss and sore throat.   Eyes: Negative.   Respiratory: Negative.  Negative for cough, shortness of breath and wheezing.   Cardiovascular: Negative.  Negative for chest pain and palpitations.  Gastrointestinal: Negative.  Negative for abdominal pain, blood in stool, constipation, nausea and vomiting.  Musculoskeletal: Positive for joint pain. Negative for back pain and myalgias.  Neurological: Negative.  Negative for dizziness, weakness and headaches.  Psychiatric/Behavioral: Negative.  Negative for depression and suicidal ideas. The patient is not nervous/anxious and does not have insomnia.    All other ROS negative except what  is listed above and in the HPI.      Objective:    BP (!) 142/85    Pulse 76    Temp 98.2 F (36.8 C)    Ht 5' 8.03" (1.728 m)    Wt 253 lb 4 oz (114.9 kg)    SpO2 98%    BMI 38.47 kg/m   Wt Readings from Last 3 Encounters:  11/30/19 253 lb 4 oz (114.9 kg)  07/17/19 253 lb (114.8 kg)  05/20/19 254 lb (115.2 kg)    Physical Exam Vitals and nursing note reviewed.  Constitutional:      Appearance: Normal appearance. He is obese.  HENT:     Head: Normocephalic.     Right Ear: Tympanic membrane, ear canal and external ear normal. There is no impacted cerumen.     Left Ear: Tympanic membrane, ear canal and external ear normal. There is no impacted cerumen.     Nose: Nose normal. No congestion or rhinorrhea.     Mouth/Throat:     Mouth: Mucous membranes are moist.     Pharynx: Oropharynx is clear. No oropharyngeal exudate or posterior oropharyngeal erythema.  Eyes:     General: No scleral icterus.       Right eye: No discharge.        Left eye: No discharge.     Extraocular Movements: Extraocular movements intact.     Pupils: Pupils are equal, round, and reactive to light.  Neck:     Vascular: No carotid bruit.  Cardiovascular:     Rate and Rhythm: Normal rate and regular rhythm.     Heart sounds: Normal heart sounds. No murmur heard.  No gallop.   Pulmonary:     Effort: Pulmonary effort is normal. No respiratory distress.     Breath sounds: Normal breath sounds. No wheezing or rhonchi.  Abdominal:     General: Abdomen is flat. Bowel sounds are normal. There is no distension.     Palpations: Abdomen is soft.     Tenderness: There is no abdominal tenderness.  Genitourinary:    Comments: Deferred using shared decision making Musculoskeletal:        General: No swelling or tenderness. Normal range of motion.     Cervical back: Normal range of motion and neck supple. No tenderness.     Right lower leg: No edema.     Left lower leg: No edema.  Lymphadenopathy:     Cervical: No  cervical adenopathy.  Skin:    General: Skin is warm and dry.     Capillary Refill: Capillary refill takes less than 2 seconds.     Coloration: Skin is not jaundiced.     Findings: No erythema, lesion or rash.  Neurological:     General: No focal deficit present.     Mental Status: He is alert and oriented to person, place, and time. Mental status is at baseline.     Motor: No weakness.     Gait: Gait normal.  Psychiatric:        Mood and Affect: Mood normal.        Behavior: Behavior normal.        Thought Content: Thought content normal.        Judgment: Judgment normal.        Assessment & Plan:   Problem List Items Addressed This Visit      Cardiovascular and Mediastinum   Essential hypertension    Chronic, ongoing.  BP remains elevated above goal of 130/80 upon recheck in clinic today.  Will increase benazepril to 40mg  and follow up in 1 month.  CMP and CBC today.      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  Last A1c 05/2019  6.3%.  Will recheck HgbA1c today and continue Metformin 1,000 mg twice daily for now.  Foot exam done today.  Patient will reach out to schedule eye exam.  Continue Ace for kidney protection.  Follow up in 3-6 months pending HgbA1c result.      Relevant Medications   rosuvastatin (CRESTOR) 20 MG tablet   benazepril (LOTENSIN) 40 MG tablet   Other Relevant Orders   Hemoglobin A1c     Other   BMI 38.0-38.9,adult    Discussed focusing on dietary and lifestyle changes including limiting simple sugars and carbohydrates and increasing physical activity with goal 30 minutes 5 times weekly.      Hypercholesteremia    Chronic, previously stable.  Will recheck lipids and CMP today.  Goal LDL less than 70 as patient has diabetes.  Patient is not fasting.  Continue crestor 20 mg for now.  Follow up in 6  months.      Relevant Medications   rosuvastatin (CRESTOR) 20 MG tablet   benazepril (LOTENSIN) 40 MG tablet   Other Relevant Orders   Lipid Panel w/o Chol/HDL Ratio   Morbid obesity ([place])    Discussed focusing on dietary and lifestyle changes including limiting simple sugars and carbohydrates and increasing physical activity with goal 30 minutes 5 times weekly.      Relevant Orders   CBC with Differential/Platelet   Comprehensive metabolic panel   Hemoglobin A1c   Wrist pain, acute, left    Acute, ongoing.  Unclear etiology although seems strain due to overuse may be cause. May also be ulnar tunnel syndrome. Will obtain x-ray imaging today.  Discussed RICE method, encouraged use of ice with Tylenol to help with pain/inflammation.  May consider orthopedic referral if pain continues.      Relevant Orders   DG Wrist Complete Left    Other Visit Diagnoses    Annual physical exam    -  Primary   Immunization due       Relevant Orders   Flu Vaccine QUAD High Dose(Fluad) (Completed)   Screening for thyroid disorder       Relevant Orders   TSH   Screening for colon cancer       Relevant Orders   Ambulatory referral to Gastroenterology       Discussed aspirin prophylaxis for myocardial infarction prevention and decision was made to continue ASA  LABORATORY TESTING:  Health maintenance labs ordered today as discussed above.   The natural history of prostate cancer and ongoing controversy regarding screening and potential treatment outcomes of prostate cancer has been discussed with the patient. The meaning of a false positive PSA and a false negative PSA has been discussed. He indicates understanding of the limitations of this screening test and wishes not to proceed with screening PSA testing.   IMMUNIZATIONS:   - Tdap: Tetanus vaccination status reviewed: last tetanus booster within 10 years. - Influenza: Administered today - Pneumovax: Up to date - Prevnar: Up to date -  HPV: Not applicable - Zostavax vaccine: Not applicable  - YSAYT-01 vaccine: fully vaccinated with Moderna in March, planning to get booster soon  SCREENING:  - Colonoscopy: Ordered today  Discussed with patient purpose of the colonoscopy is to detect colon cancer at curable precancerous or early stages   - AAA Screening: Not applicable  -Hearing Test: Not applicable  -Spirometry: Not applicable   PATIENT COUNSELING:    Sexuality: Discussed sexually transmitted diseases, partner selection, use of condoms, avoidance of unintended pregnancy  and contraceptive alternatives.   Advised to avoid cigarette smoking.  I discussed with the patient that most people either abstain from alcohol or drink within safe limits (<=14/week and <=4 drinks/occasion for males, <=7/weeks and <= 3 drinks/occasion for females) and that the risk for alcohol disorders and other health effects rises proportionally with the number of drinks per week and how often a drinker exceeds daily limits.  Discussed cessation/primary prevention of drug use and availability of treatment for abuse.   Diet: Encouraged to adjust caloric intake to maintain  or achieve ideal body weight, to reduce intake of dietary saturated fat and total fat, to limit sodium intake by avoiding high sodium foods and not adding table salt, and to maintain adequate dietary potassium and calcium preferably from fresh fruits, vegetables, and low-fat dairy products.    stressed the importance of regular exercise  Injury prevention: Discussed safety belts, safety helmets, smoke detector, smoking near bedding or upholstery.   Dental health: Discussed importance of regular tooth brushing, flossing, and dental visits.   Follow up plan: NEXT PREVENTATIVE PHYSICAL DUE IN 1 YEAR. Return in about 1 month (around 12/30/2019) for BP f/u, 6 months HLD, DM f/u.

## 2019-12-01 ENCOUNTER — Ambulatory Visit
Admission: RE | Admit: 2019-12-01 | Discharge: 2019-12-01 | Disposition: A | Discharge status: Home / Self Care | Payer: BC Managed Care – PPO | Source: Ambulatory Visit | Attending: Nurse Practitioner | Admitting: Nurse Practitioner

## 2019-12-01 ENCOUNTER — Ambulatory Visit
Admission: RE | Admit: 2019-12-01 | Discharge: 2019-12-01 | Disposition: A | Discharge status: Home / Self Care | Payer: BC Managed Care – PPO | Source: Home / Self Care | Attending: Nurse Practitioner | Admitting: Nurse Practitioner

## 2019-12-01 DIAGNOSIS — M25532 Pain in left wrist: Secondary | ICD-10-CM | POA: Insufficient documentation

## 2019-12-01 LAB — HEMOGLOBIN A1C
Est. average glucose Bld gHb Est-mCnc: 134 mg/dL
Hgb A1c MFr Bld: 6.3 % — ABNORMAL HIGH (ref 4.8–5.6)

## 2019-12-01 LAB — CBC WITH DIFFERENTIAL/PLATELET
Basophils Absolute: 0.1 10*3/uL (ref 0.0–0.2)
Basos: 1 %
EOS (ABSOLUTE): 0.4 10*3/uL (ref 0.0–0.4)
Eos: 5 %
Hematocrit: 42.9 % (ref 37.5–51.0)
Hemoglobin: 14.9 g/dL (ref 13.0–17.7)
Immature Grans (Abs): 0 10*3/uL (ref 0.0–0.1)
Immature Granulocytes: 0 %
Lymphocytes Absolute: 2.5 10*3/uL (ref 0.7–3.1)
Lymphs: 34 %
MCH: 30.9 pg (ref 26.6–33.0)
MCHC: 34.7 g/dL (ref 31.5–35.7)
MCV: 89 fL (ref 79–97)
Monocytes Absolute: 0.5 10*3/uL (ref 0.1–0.9)
Monocytes: 7 %
Neutrophils Absolute: 3.9 10*3/uL (ref 1.4–7.0)
Neutrophils: 53 %
Platelets: 176 10*3/uL (ref 150–450)
RBC: 4.82 x10E6/uL (ref 4.14–5.80)
RDW: 12.5 % (ref 11.6–15.4)
WBC: 7.3 10*3/uL (ref 3.4–10.8)

## 2019-12-01 LAB — LIPID PANEL W/O CHOL/HDL RATIO
Cholesterol, Total: 109 mg/dL (ref 100–199)
HDL: 38 mg/dL — ABNORMAL LOW (ref >39)
LDL Chol Calc (NIH): 38 mg/dL (ref 0–99)
Triglycerides: 208 mg/dL — ABNORMAL HIGH (ref 0–149)
VLDL Cholesterol Cal: 33 mg/dL (ref 5–40)

## 2019-12-01 LAB — COMPREHENSIVE METABOLIC PANEL
ALT: 35 IU/L (ref 0–44)
AST: 31 IU/L (ref 0–40)
Albumin/Globulin Ratio: 1.8 (ref 1.2–2.2)
Albumin: 4.6 g/dL (ref 3.8–4.8)
Alkaline Phosphatase: 64 IU/L (ref 44–121)
BUN/Creatinine Ratio: 18 (ref 10–24)
BUN: 15 mg/dL (ref 8–27)
Bilirubin Total: 0.3 mg/dL (ref 0.0–1.2)
CO2: 22 mmol/L (ref 20–29)
Calcium: 8.9 mg/dL (ref 8.6–10.2)
Chloride: 102 mmol/L (ref 96–106)
Creatinine, Ser: 0.82 mg/dL (ref 0.76–1.27)
GFR calc Af Amer: 107 mL/min/{1.73_m2} (ref >59)
GFR calc non Af Amer: 93 mL/min/{1.73_m2} (ref >59)
Globulin, Total: 2.5 g/dL (ref 1.5–4.5)
Glucose: 141 mg/dL — ABNORMAL HIGH (ref 65–99)
Potassium: 4.1 mmol/L (ref 3.5–5.2)
Sodium: 136 mmol/L (ref 134–144)
Total Protein: 7.1 g/dL (ref 6.0–8.5)

## 2019-12-01 LAB — TSH: TSH: 1.96 u[IU]/mL (ref 0.450–4.500)

## 2019-12-01 NOTE — Assessment & Plan Note (Signed)
Discussed focusing on dietary and lifestyle changes including limiting simple sugars and carbohydrates and increasing physical activity with goal 30 minutes 5 times weekly.

## 2019-12-01 NOTE — Assessment & Plan Note (Signed)
Chronic, ongoing.  BP remains elevated above goal of 130/80 upon recheck in clinic today.  Will increase benazepril to 40mg  and follow up in 1 month.  CMP and CBC today.

## 2019-12-01 NOTE — Assessment & Plan Note (Addendum)
Chronic, ongoing.  Last A1c 05/2019 6.3%.  Will recheck HgbA1c today and continue Metformin 1,000 mg twice daily for now.  Foot exam done today.  Patient will reach out to schedule eye exam.  Continue Ace for kidney protection.  Follow up in 3-6 months pending HgbA1c result.

## 2019-12-01 NOTE — Assessment & Plan Note (Signed)
Acute, ongoing.  Unclear etiology although seems strain due to overuse may be cause. May also be ulnar tunnel syndrome. Will obtain x-ray imaging today.  Discussed RICE method, encouraged use of ice with Tylenol to help with pain/inflammation.  May consider orthopedic referral if pain continues.

## 2019-12-01 NOTE — Assessment & Plan Note (Addendum)
Chronic, previously stable.  Will recheck lipids and CMP today.  Goal LDL less than 70 as patient has diabetes.  Patient is not fasting.  Continue crestor 20 mg for now.  Follow up in 6 months.

## 2019-12-03 ENCOUNTER — Other Ambulatory Visit: Payer: Self-pay | Admitting: Nurse Practitioner

## 2019-12-03 DIAGNOSIS — S62102A Fracture of unspecified carpal bone, left wrist, initial encounter for closed fracture: Secondary | ICD-10-CM

## 2019-12-03 NOTE — Progress Notes (Signed)
Orders placed.

## 2019-12-03 NOTE — Progress Notes (Signed)
r 

## 2019-12-16 ENCOUNTER — Other Ambulatory Visit: Payer: Self-pay

## 2019-12-16 MED ORDER — BENAZEPRIL HCL 40 MG PO TABS: 40.0000 mg | ORAL_TABLET | Freq: Every day | ORAL | 0 refills | Status: DC

## 2019-12-18 ENCOUNTER — Other Ambulatory Visit: Payer: Self-pay | Admitting: Family Medicine

## 2019-12-30 ENCOUNTER — Ambulatory Visit: Payer: BC Managed Care – PPO | Admitting: Family Medicine

## 2020-01-16 ENCOUNTER — Other Ambulatory Visit: Payer: Self-pay | Admitting: Nurse Practitioner

## 2020-03-17 ENCOUNTER — Other Ambulatory Visit: Payer: Self-pay | Admitting: Family Medicine

## 2020-03-17 DIAGNOSIS — M545 Low back pain, unspecified: Secondary | ICD-10-CM

## 2020-03-17 NOTE — Telephone Encounter (Signed)
Requested Prescriptions  Pending Prescriptions Disp Refills  . meloxicam (MOBIC) 15 MG tablet [Pharmacy Med Name: MELOXICAM 15 MG TABLET] 90 tablet 1    Sig: TAKE 1 TABLET BY MOUTH EVERY DAY     Analgesics:  COX2 Inhibitors Passed - 03/17/2020  1:55 AM      Passed - HGB in normal range and within 360 days    Hemoglobin  Date Value Ref Range Status  11/30/2019 14.9 13.0 - 17.7 g/dL Final         Passed - Cr in normal range and within 360 days    Creatinine  Date Value Ref Range Status  09/18/2012 0.78 0.60 - 1.30 mg/dL Final   Creatinine, Ser  Date Value Ref Range Status  11/30/2019 0.82 0.76 - 1.27 mg/dL Final         Passed - Patient is not pregnant      Passed - Valid encounter within last 12 months    Recent Outpatient Visits          3 months ago Annual physical exam   [facility] Eulogio Bear, NP   8 months ago [place], [place], [place]   10 months ago Essential hypertension   [place], [place], [place]   1 year ago Diabetes mellitus associated with hormonal etiology [facility])   [facility] [place], [place]   1 year ago Diabetes mellitus associated with hormonal etiology [facility])   [facility], [place]

## 2020-04-16 ENCOUNTER — Other Ambulatory Visit: Payer: Self-pay | Admitting: Nurse Practitioner

## 2020-04-16 NOTE — Telephone Encounter (Signed)
Requested Prescriptions  Pending Prescriptions Disp Refills  . benazepril (LOTENSIN) 40 MG tablet [Pharmacy Med Name: BENAZEPRIL HCL 40 MG TABLET] 90 tablet 0    Sig: TAKE 1 TABLET BY MOUTH EVERY DAY     Cardiovascular:  ACE Inhibitors Failed - 04/16/2020  9:05 AM      Failed - Last BP in normal range    BP Readings from Last 1 Encounters:  11/30/19 (!) 142/85         Passed - Cr in normal range and within 180 days    Creatinine  Date Value Ref Range Status  09/18/2012 0.78 0.60 - 1.30 mg/dL Final   Creatinine, Ser  Date Value Ref Range Status  11/30/2019 0.82 0.76 - 1.27 mg/dL Final         Passed - K in normal range and within 180 days    Potassium  Date Value Ref Range Status  11/30/2019 4.1 3.5 - 5.2 mmol/L Final  09/18/2012 3.9 3.5 - 5.1 mmol/L Final         Passed - Patient is not pregnant      Passed - Valid encounter within last 6 months    Recent Outpatient Visits          4 months ago Annual physical exam   [facility] Eulogio Bear, NP   9 months ago Rash   [facility] [place], [place]   11 months ago Essential hypertension   [facility] Merrie Roof [place], [place]   1 year ago Diabetes mellitus associated with hormonal etiology [facility])   [facility] [place], [place]   1 year ago Diabetes mellitus associated with hormonal etiology [facility])   [facility] [place], [place]

## 2020-06-27 ENCOUNTER — Other Ambulatory Visit: Payer: Self-pay | Admitting: Nurse Practitioner

## 2020-06-27 DIAGNOSIS — E7800 Pure hypercholesterolemia, unspecified: Secondary | ICD-10-CM

## 2020-07-11 ENCOUNTER — Other Ambulatory Visit: Payer: Self-pay | Admitting: Internal Medicine

## 2020-07-11 NOTE — Telephone Encounter (Signed)
Pt. Has appointment 07/18/20.

## 2020-07-18 ENCOUNTER — Ambulatory Visit: Payer: BC Managed Care – PPO | Admitting: Internal Medicine

## 2020-07-18 ENCOUNTER — Other Ambulatory Visit: Payer: Self-pay

## 2020-07-18 ENCOUNTER — Encounter: Payer: Self-pay | Admitting: Internal Medicine

## 2020-07-18 VITALS — BP 144/89 | HR 73 | Temp 99.0°F | Ht 68.11 in | Wt 261.4 lb

## 2020-07-18 DIAGNOSIS — E119 Type 2 diabetes mellitus without complications: Secondary | ICD-10-CM | POA: Diagnosis not present

## 2020-07-18 DIAGNOSIS — Z1211 Encounter for screening for malignant neoplasm of colon: Secondary | ICD-10-CM | POA: Diagnosis not present

## 2020-07-18 NOTE — Progress Notes (Signed)
K   BP (!) 144/89   Pulse 73   Temp 99 F (37.2 C) (Oral)   Ht 5' 8.11" (1.73 m)   Wt 261 lb 6.4 oz (118.6 kg)   SpO2 96%   BMI 39.62 kg/m    Subjective:    Patient ID: Robert Griffith, male    DOB: 12-07-1955, 65 y.o.   MRN: [id number]  Chief Complaint  Patient presents with   Hypertension   Hyperlipidemia   R. foot pain    HPI: Robert Griffith is a 66 y.o. male  Pt I shere to establish care.  Has a ho Htn/ HLD/ Dm   Hypertension This is a chronic problem. The problem is controlled. Pertinent negatives include no anxiety, blurred vision, chest pain, headaches, malaise/fatigue, neck pain, orthopnea or palpitations.  Hyperlipidemia This is a chronic problem. The current episode started more than 1 year ago. The problem is controlled. Pertinent negatives include no chest pain.   Chief Complaint  Patient presents with   Hypertension   Hyperlipidemia   R. foot pain    Relevant past medical, surgical, family and social history reviewed and updated as indicated. Interim medical history since our last visit reviewed. Allergies and medications reviewed and updated.  Review of Systems  Constitutional:  Negative for malaise/fatigue.  Eyes:  Negative for blurred vision.  Cardiovascular:  Negative for chest pain, palpitations and orthopnea.  Musculoskeletal:  Negative for neck pain.  Neurological:  Negative for headaches.   Per HPI unless specifically indicated above     Objective:    BP (!) 144/89   Pulse 73   Temp 99 F (37.2 C) (Oral)   Ht 5' 8.11" (1.73 m)   Wt 261 lb 6.4 oz (118.6 kg)   SpO2 96%   BMI 39.62 kg/m   Wt Readings from Last 3 Encounters:  07/18/20 261 lb 6.4 oz (118.6 kg)  11/30/19 253 lb 4 oz (114.9 kg)  07/17/19 253 lb (114.8 kg)    Physical Exam Vitals and nursing note reviewed.  Constitutional:      General: He is not in acute distress.    Appearance: Normal appearance. He is not ill-appearing or diaphoretic.  HENT:     Head:  Normocephalic and atraumatic.     Right Ear: Tympanic membrane and external ear normal. There is no impacted cerumen.     Left Ear: External ear normal.     Nose: No congestion or rhinorrhea.     Mouth/Throat:     Pharynx: No oropharyngeal exudate or posterior oropharyngeal erythema.  Eyes:     Conjunctiva/sclera: Conjunctivae normal.     Pupils: Pupils are equal, round, and reactive to light.  Cardiovascular:     Rate and Rhythm: Normal rate and regular rhythm.     Heart sounds: No murmur heard.   No friction rub. No gallop.  Pulmonary:     Effort: No respiratory distress.     Breath sounds: No stridor. No wheezing or rhonchi.  Chest:     Chest wall: No tenderness.  Abdominal:     General: Abdomen is flat. Bowel sounds are normal.     Palpations: Abdomen is soft. There is no mass.     Tenderness: There is no abdominal tenderness.  Musculoskeletal:     Cervical back: Normal range of motion and neck supple. No rigidity or tenderness.     Left lower leg: No edema.  Skin:    General: Skin is warm and dry.  Neurological:  Mental Status: He is alert.    Results for orders placed or performed in visit on 11/30/19  Lipid Panel w/o Chol/HDL Ratio  Result Value Ref Range   Cholesterol, Total 109 100 - 199 mg/dL   Triglycerides 208 (H) 0 - 149 mg/dL   HDL 38 (L) >39 mg/dL   VLDL Cholesterol Cal 33 5 - 40 mg/dL   LDL Chol Calc (NIH) 38 0 - 99 mg/dL  TSH  Result Value Ref Range   TSH 1.960 0.450 - 4.500 uIU/mL  CBC with Differential/Platelet  Result Value Ref Range   WBC 7.3 3.4 - 10.8 x10E3/uL   RBC 4.82 4.14 - 5.80 x10E6/uL   Hemoglobin 14.9 13.0 - 17.7 g/dL   Hematocrit 42.9 37.5 - 51.0 %   MCV 89 79 - 97 fL   MCH 30.9 26.6 - 33.0 pg   MCHC 34.7 31.5 - 35.7 g/dL   RDW 12.5 11.6 - 15.4 %   Platelets 176 150 - 450 x10E3/uL   Neutrophils 53 Not Estab. %   Lymphs 34 Not Estab. %   Monocytes 7 Not Estab. %   Eos 5 Not Estab. %   Basos 1 Not Estab. %   Neutrophils  Absolute 3.9 1.4 - 7.0 x10E3/uL   Lymphocytes Absolute 2.5 0.7 - 3.1 x10E3/uL   Monocytes Absolute 0.5 0.1 - 0.9 x10E3/uL   EOS (ABSOLUTE) 0.4 0.0 - 0.4 x10E3/uL   Basophils Absolute 0.1 0.0 - 0.2 x10E3/uL   Immature Granulocytes 0 Not Estab. %   Immature Grans (Abs) 0.0 0.0 - 0.1 x10E3/uL  Comprehensive metabolic panel  Result Value Ref Range   Glucose 141 (H) 65 - 99 mg/dL   BUN 15 8 - 27 mg/dL   Creatinine, Ser 0.82 0.76 - 1.27 mg/dL   GFR calc non Af Amer 93 >59 mL/min/1.73   GFR calc Af Amer 107 >59 mL/min/1.73   BUN/Creatinine Ratio 18 10 - 24   Sodium 136 134 - 144 mmol/L   Potassium 4.1 3.5 - 5.2 mmol/L   Chloride 102 96 - 106 mmol/L   CO2 22 20 - 29 mmol/L   Calcium 8.9 8.6 - 10.2 mg/dL   Total Protein 7.1 6.0 - 8.5 g/dL   Albumin 4.6 3.8 - 4.8 g/dL   Globulin, Total 2.5 1.5 - 4.5 g/dL   Albumin/Globulin Ratio 1.8 1.2 - 2.2   Bilirubin Total 0.3 0.0 - 1.2 mg/dL   Alkaline Phosphatase 64 44 - 121 IU/L   AST 31 0 - 40 IU/L   ALT 35 0 - 44 IU/L  Hemoglobin A1c  Result Value Ref Range   Hgb A1c MFr Bld 6.3 (H) 4.8 - 5.6 %   Est. average glucose Bld gHb Est-mCnc 134 mg/dL        Current Outpatient Medications:    aspirin 81 MG EC tablet, Take 81 mg by mouth daily.  , Disp: , Rfl:    benazepril (LOTENSIN) 40 MG tablet, TAKE 1 TABLET BY MOUTH EVERY DAY, Disp: 90 tablet, Rfl: 0   cetirizine (ZYRTEC) 10 MG tablet, Take 1 tablet (10 mg total) by mouth daily., Disp: 90 tablet, Rfl: 2   meloxicam (MOBIC) 15 MG tablet, TAKE 1 TABLET BY MOUTH EVERY DAY, Disp: 90 tablet, Rfl: 1   metFORMIN (GLUCOPHAGE) 500 MG tablet, TAKE 2 TABLETS (1,000 MG TOTAL) BY MOUTH 2 (TWO) TIMES DAILY WITH A MEAL., Disp: 360 tablet, Rfl: 1   ONE TOUCH ULTRA TEST test strip, USE TO TEST BLOOD SUGAR DAILY (  EX E11.9), Disp: 100 each, Rfl: 4   rosuvastatin (CRESTOR) 20 MG tablet, TAKE 1 TABLET BY MOUTH EVERY DAY, Disp: 90 tablet, Rfl: 1    Assessment & Plan:  1, DM is on metformin 1000 mg once a day,  supposed bid. Checks FSBS - 135 in the am   Ref. Range 05/20/2019 16:41 11/30/2019 16:15  Glucose Latest Ref Range: 65 - 99 mg/dL 112 (H) 141 (H)  Hemoglobin A1C Latest Ref Range: 4.8 - 5.6 % 6.3 (H) 6.3 (H)   check HbA1c,  urine  microalbumin  diabetic diet plan given to pt  adviced regarding hypoglycemia and instructions given to pt today on how to prevent and treat the same if it were to occur. pt acknowledges the plan and voices understanding of the same.  exercise plan given and encouraged.   advice diabetic yearly podiatry, ophthalmology , nutritionist , dental check q 6 months  2.  HLD is lipitor for such  recheck FLP, check LFT's work on diet, SE of meds explained to pt. low fat and high fiber diet explained to pt.    3. Htn  Is on lotensin   Continue current meds.  Medication compliance emphasised. pt advised to keep Bp logs. Pt verbalised understanding of the same. Pt to have a low salt diet . Exercise to reach a goal of at least 150 mins a week.  lifestyle modifications explained and pt understands importance of the above.    Problem List Items Addressed This Visit   None    No orders of the defined types were placed in this encounter.    No orders of the defined types were placed in this encounter.    Follow up plan: No follow-ups on file.  Health Maintenance   PSA : recheck Cscope : 2016  to see them in 5 yrs.  Pneumonia vaccine : next visit prevanar 2021.  Pneumovax - 2012

## 2020-07-19 ENCOUNTER — Other Ambulatory Visit: Payer: Self-pay

## 2020-07-19 MED ORDER — CLENPIQ 10-3.5-12 MG-GM -GM/160ML PO SOLN: 320.0000 mL | ORAL | 0 refills | Status: DC

## 2020-08-22 ENCOUNTER — Other Ambulatory Visit: Payer: Self-pay | Admitting: Nurse Practitioner

## 2020-08-22 ENCOUNTER — Other Ambulatory Visit: Payer: BC Managed Care – PPO

## 2020-08-22 ENCOUNTER — Other Ambulatory Visit: Payer: Self-pay

## 2020-08-22 DIAGNOSIS — I10 Essential (primary) hypertension: Secondary | ICD-10-CM

## 2020-08-22 DIAGNOSIS — E1169 Type 2 diabetes mellitus with other specified complication: Secondary | ICD-10-CM

## 2020-08-22 DIAGNOSIS — Z1329 Encounter for screening for other suspected endocrine disorder: Secondary | ICD-10-CM

## 2020-08-22 DIAGNOSIS — E7800 Pure hypercholesterolemia, unspecified: Secondary | ICD-10-CM

## 2020-08-22 LAB — BAYER DCA HB A1C WAIVED: HB A1C (BAYER DCA - WAIVED): 6.9 % (ref <7.0)

## 2020-08-23 LAB — CMP14+EGFR
ALT: 25 IU/L (ref 0–44)
AST: 23 IU/L (ref 0–40)
Albumin/Globulin Ratio: 1.9 (ref 1.2–2.2)
Albumin: 4.3 g/dL (ref 3.8–4.8)
Alkaline Phosphatase: 74 IU/L (ref 44–121)
BUN/Creatinine Ratio: 14 (ref 10–24)
BUN: 11 mg/dL (ref 8–27)
Bilirubin Total: 0.6 mg/dL (ref 0.0–1.2)
CO2: 23 mmol/L (ref 20–29)
Calcium: 9 mg/dL (ref 8.6–10.2)
Chloride: 103 mmol/L (ref 96–106)
Creatinine, Ser: 0.81 mg/dL (ref 0.76–1.27)
Globulin, Total: 2.3 g/dL (ref 1.5–4.5)
Glucose: 139 mg/dL — ABNORMAL HIGH (ref 65–99)
Potassium: 4.5 mmol/L (ref 3.5–5.2)
Sodium: 139 mmol/L (ref 134–144)
Total Protein: 6.6 g/dL (ref 6.0–8.5)
eGFR: 97 mL/min/{1.73_m2} (ref >59)

## 2020-08-23 LAB — THYROID PANEL WITH TSH
Free Thyroxine Index: 1.4 (ref 1.2–4.9)
T3 Uptake Ratio: 27 % (ref 24–39)
T4, Total: 5.1 ug/dL (ref 4.5–12.0)
TSH: 2.93 u[IU]/mL (ref 0.450–4.500)

## 2020-08-23 LAB — LIPID PANEL
Chol/HDL Ratio: 2.9 ratio (ref 0.0–5.0)
Cholesterol, Total: 114 mg/dL (ref 100–199)
HDL: 39 mg/dL — ABNORMAL LOW (ref >39)
LDL Chol Calc (NIH): 49 mg/dL (ref 0–99)
Triglycerides: 149 mg/dL (ref 0–149)
VLDL Cholesterol Cal: 26 mg/dL (ref 5–40)

## 2020-08-23 LAB — CBC WITH DIFFERENTIAL/PLATELET
Basophils Absolute: 0.1 10*3/uL (ref 0.0–0.2)
Basos: 1 %
EOS (ABSOLUTE): 0.3 10*3/uL (ref 0.0–0.4)
Eos: 6 %
Hematocrit: 45.9 % (ref 37.5–51.0)
Hemoglobin: 15.3 g/dL (ref 13.0–17.7)
Immature Grans (Abs): 0 10*3/uL (ref 0.0–0.1)
Immature Granulocytes: 0 %
Lymphocytes Absolute: 1.8 10*3/uL (ref 0.7–3.1)
Lymphs: 32 %
MCH: 29.7 pg (ref 26.6–33.0)
MCHC: 33.3 g/dL (ref 31.5–35.7)
MCV: 89 fL (ref 79–97)
Monocytes Absolute: 0.5 10*3/uL (ref 0.1–0.9)
Monocytes: 9 %
Neutrophils Absolute: 3 10*3/uL (ref 1.4–7.0)
Neutrophils: 52 %
Platelets: 174 10*3/uL (ref 150–450)
RBC: 5.16 x10E6/uL (ref 4.14–5.80)
RDW: 13 % (ref 11.6–15.4)
WBC: 5.8 10*3/uL (ref 3.4–10.8)

## 2020-08-29 ENCOUNTER — Ambulatory Visit: Payer: BC Managed Care – PPO | Admitting: Internal Medicine

## 2020-08-30 ENCOUNTER — Encounter: Payer: Self-pay | Admitting: Gastroenterology

## 2020-09-09 NOTE — Anesthesia Preprocedure Evaluation (Addendum)
Anesthesia Evaluation  Patient identified by MRN, date of birth, ID band Patient awake    Reviewed: Allergy & Precautions, NPO status , Patient's Chart, lab work & pertinent test results, reviewed documented beta blocker date and time   History of Anesthesia Complications Negative for: history of anesthetic complications  Airway Mallampati: III  TM Distance: >3 FB Neck ROM: Full    Dental  (+) Upper Dentures, Lower Dentures   Pulmonary    breath sounds clear to auscultation       Cardiovascular hypertension, (-) angina(-) DOE  Rhythm:Regular Rate:Normal   HLD   Neuro/Psych    GI/Hepatic neg GERD  ,  Endo/Other  diabetes  Renal/GU Renal disease (Stones)     Musculoskeletal   Abdominal (+) + obese (BMI 40),   Peds  Hematology   Anesthesia Other Findings   Reproductive/Obstetrics                           Anesthesia Physical Anesthesia Plan  ASA: 3  Anesthesia Plan: General   Post-op Pain Management:    Induction: Intravenous  PONV Risk Score and Plan: 2 and Propofol infusion, TIVA and Treatment may vary due to age or medical condition  Airway Management Planned: Natural Airway and Nasal Cannula  Additional Equipment:   Intra-op Plan:   Post-operative Plan:   Informed Consent: I have reviewed the patients History and Physical, chart, labs and discussed the procedure including the risks, benefits and alternatives for the proposed anesthesia with the patient or authorized representative who has indicated his/her understanding and acceptance.       Plan Discussed with: CRNA and Anesthesiologist  Anesthesia Plan Comments:         Anesthesia Quick Evaluation

## 2020-09-12 ENCOUNTER — Other Ambulatory Visit: Payer: Self-pay

## 2020-09-12 ENCOUNTER — Ambulatory Visit: Payer: BC Managed Care – PPO | Admitting: Anesthesiology

## 2020-09-12 ENCOUNTER — Encounter: Payer: Self-pay | Admitting: Gastroenterology

## 2020-09-12 ENCOUNTER — Ambulatory Visit
Admission: RE | Admit: 2020-09-12 | Discharge: 2020-09-12 | Disposition: A | Discharge status: Home / Self Care | Payer: BC Managed Care – PPO | Attending: Gastroenterology | Admitting: Gastroenterology

## 2020-09-12 ENCOUNTER — Encounter
Admission: RE | Disposition: A | Discharge status: Home / Self Care | Payer: Self-pay | Source: Home / Self Care | Attending: Gastroenterology

## 2020-09-12 DIAGNOSIS — Z7982 Long term (current) use of aspirin: Secondary | ICD-10-CM | POA: Insufficient documentation

## 2020-09-12 DIAGNOSIS — E119 Type 2 diabetes mellitus without complications: Secondary | ICD-10-CM | POA: Insufficient documentation

## 2020-09-12 DIAGNOSIS — K648 Other hemorrhoids: Secondary | ICD-10-CM | POA: Insufficient documentation

## 2020-09-12 DIAGNOSIS — Z87442 Personal history of urinary calculi: Secondary | ICD-10-CM | POA: Diagnosis not present

## 2020-09-12 DIAGNOSIS — Z79899 Other long term (current) drug therapy: Secondary | ICD-10-CM | POA: Diagnosis not present

## 2020-09-12 DIAGNOSIS — I10 Essential (primary) hypertension: Secondary | ICD-10-CM | POA: Diagnosis not present

## 2020-09-12 DIAGNOSIS — E785 Hyperlipidemia, unspecified: Secondary | ICD-10-CM | POA: Diagnosis not present

## 2020-09-12 DIAGNOSIS — Z1211 Encounter for screening for malignant neoplasm of colon: Secondary | ICD-10-CM | POA: Diagnosis not present

## 2020-09-12 DIAGNOSIS — Z860100 Personal history of colon polyps, unspecified: Secondary | ICD-10-CM

## 2020-09-12 DIAGNOSIS — Z7984 Long term (current) use of oral hypoglycemic drugs: Secondary | ICD-10-CM | POA: Diagnosis not present

## 2020-09-12 DIAGNOSIS — E669 Obesity, unspecified: Secondary | ICD-10-CM | POA: Diagnosis not present

## 2020-09-12 DIAGNOSIS — G4730 Sleep apnea, unspecified: Secondary | ICD-10-CM | POA: Diagnosis not present

## 2020-09-12 DIAGNOSIS — D122 Benign neoplasm of ascending colon: Secondary | ICD-10-CM | POA: Diagnosis not present

## 2020-09-12 DIAGNOSIS — Z6838 Body mass index (BMI) 38.0-38.9, adult: Secondary | ICD-10-CM | POA: Insufficient documentation

## 2020-09-12 DIAGNOSIS — K5730 Diverticulosis of large intestine without perforation or abscess without bleeding: Secondary | ICD-10-CM | POA: Diagnosis not present

## 2020-09-12 DIAGNOSIS — K635 Polyp of colon: Secondary | ICD-10-CM

## 2020-09-12 DIAGNOSIS — Z8249 Family history of ischemic heart disease and other diseases of the circulatory system: Secondary | ICD-10-CM | POA: Insufficient documentation

## 2020-09-12 HISTORY — PX: COLONOSCOPY: SHX5424

## 2020-09-12 HISTORY — DX: Hyperlipidemia, unspecified: E78.5

## 2020-09-12 HISTORY — PX: POLYPECTOMY: SHX5525

## 2020-09-12 HISTORY — DX: Presence of dental prosthetic device (complete) (partial): Z97.2

## 2020-09-12 LAB — GLUCOSE, CAPILLARY
Glucose-Capillary: 134 mg/dL — ABNORMAL HIGH (ref 70–99)
Glucose-Capillary: 125 mg/dL — ABNORMAL HIGH (ref 70–99)

## 2020-09-12 SURGERY — COLONOSCOPY
Anesthesia: General | Site: Rectum

## 2020-09-12 MED ORDER — LACTATED RINGERS IV SOLN: INTRAVENOUS | Status: DC

## 2020-09-12 MED ORDER — STERILE WATER FOR IRRIGATION IR SOLN
Status: DC | PRN
  Administered 2020-09-12: 50 mL

## 2020-09-12 MED ORDER — PROPOFOL 10 MG/ML IV BOLUS
INTRAVENOUS | Status: DC | PRN
  Administered 2020-09-12: 30 mg via INTRAVENOUS
  Administered 2020-09-12: 150 mg via INTRAVENOUS
  Administered 2020-09-12: 30 mg via INTRAVENOUS
  Administered 2020-09-12: 20 mg via INTRAVENOUS
  Administered 2020-09-12: 30 mg via INTRAVENOUS

## 2020-09-12 MED ORDER — LIDOCAINE HCL (CARDIAC) PF 100 MG/5ML IV SOSY
PREFILLED_SYRINGE | INTRAVENOUS | Status: DC | PRN
  Administered 2020-09-12: 30 mg via INTRAVENOUS

## 2020-09-12 SURGICAL SUPPLY — 7 items
FORCEPS BIOP RAD 4 LRG CAP 4 (CUTTING FORCEPS) ×2 IMPLANT
GOWN CVR UNV OPN BCK APRN NK (MISCELLANEOUS) ×2 IMPLANT
GOWN ISOL THUMB LOOP REG UNIV (MISCELLANEOUS) ×4
KIT PRC NS LF DISP ENDO (KITS) ×1 IMPLANT
KIT PROCEDURE OLYMPUS (KITS) ×2
MANIFOLD NEPTUNE II (INSTRUMENTS) ×2 IMPLANT
WATER STERILE IRR 250ML POUR (IV SOLUTION) ×2 IMPLANT

## 2020-09-12 NOTE — Anesthesia Procedure Notes (Signed)
Date/Time: 09/12/2020 9:10 AM Performed by: Cameron Ali, CRNA Pre-anesthesia Checklist: Patient identified, Emergency Drugs available, Suction available, Timeout performed and Patient being monitored Patient Re-evaluated:Patient Re-evaluated prior to induction Oxygen Delivery Method: Nasal cannula Placement Confirmation: positive ETCO2

## 2020-09-12 NOTE — Anesthesia Postprocedure Evaluation (Signed)
Anesthesia Post Note  Patient: Robert Griffith  Procedure(s) Performed: COLONOSCOPY (Rectum) POLYPECTOMY (Rectum)     Patient location during evaluation: PACU Anesthesia Type: General Level of consciousness: awake and alert Pain management: pain level controlled Vital Signs Assessment: post-procedure vital signs reviewed and stable Respiratory status: spontaneous breathing, nonlabored ventilation, respiratory function stable and patient connected to nasal cannula oxygen Cardiovascular status: blood pressure returned to baseline and stable Postop Assessment: no apparent nausea or vomiting Anesthetic complications: no   No notable events documented.  Sameeha Rockefeller A  Saleem Coccia

## 2020-09-12 NOTE — Op Note (Signed)
[facility] Gastroenterology Patient Name: Robert Griffith Procedure Date: 09/12/2020 9:00 AM MRN: [id number] Account #: [id number] Date of Birth: 1955/01/11 Admit Type: Outpatient Age: 65 Room: [facility] OR ROOM 01 Gender: Male Note Status: Finalized Instrument Name: [id number] Procedure:             Colonoscopy Indications:           High risk colon cancer surveillance: Personal history                         of colonic polyps 2016 Providers:             Lucilla Lame MD, MD Referring MD:          Charlynne Cousins (Referring MD) Medicines:             Propofol per Anesthesia Complications:         No immediate complications. Procedure:             Pre-Anesthesia Assessment:                        - Prior to the procedure, a History and Physical was                         performed, and patient medications and allergies were                         reviewed. The patient's tolerance of previous                         anesthesia was also reviewed. The risks and benefits                         of the procedure and the sedation options and risks                         were discussed with the patient. All questions were                         answered, and informed consent was obtained. Prior                         Anticoagulants: The patient has taken no previous                         anticoagulant or antiplatelet agents. ASA Grade                         Assessment: II - A patient with mild systemic disease.                         After reviewing the risks and benefits, the patient                         was deemed in satisfactory condition to undergo the                         procedure.  After obtaining informed consent, the colonoscope was                         passed under direct vision. Throughout the procedure,                         the patient's blood pressure, pulse, and oxygen                         saturations were monitored  continuously. The                         Colonoscope was introduced through the anus and                         advanced to the the cecum, identified by appendiceal                         orifice and ileocecal valve. The colonoscopy was                         performed without difficulty. The patient tolerated                         the procedure well. The quality of the bowel                         preparation was excellent. Findings:      The perianal and digital rectal examinations were normal.      A 3 mm polyp was found in the ascending colon. The polyp was sessile.       The polyp was removed with a cold biopsy forceps. Resection and       retrieval were complete.      Non-bleeding internal hemorrhoids were found during retroflexion. The       hemorrhoids were Grade I (internal hemorrhoids that do not prolapse).      A few small-mouthed diverticula were found in the sigmoid colon. Impression:            - One 3 mm polyp in the ascending colon, removed with                         a cold biopsy forceps. Resected and retrieved.                        - Non-bleeding internal hemorrhoids.                        - Diverticulosis in the sigmoid colon. Recommendation:        - Discharge patient to home.                        - Resume previous diet.                        - Continue present medications.                        - Await pathology results.                        -  Repeat colonoscopy in 7 years for surveillance. Procedure Code(s):     --- Professional ---                        [phone], Colonoscopy, flexible; with biopsy, single or                         multiple Diagnosis Code(s):     --- Professional ---                        Z86.010, Personal history of colonic polyps                        K63.5, Polyp of colon CPT copyright 2019 American Medical Association. All rights reserved. The codes documented in this report are preliminary and upon coder review may  be  revised to meet current compliance requirements. Lucilla Lame MD, MD 09/12/2020 9:22:17 AM This report has been signed electronically. Number of Addenda: 0 Note Initiated On: 09/12/2020 9:00 AM Scope Withdrawal Time: 0 hours 7 minutes 27 seconds  Total Procedure Duration: 0 hours 9 minutes 41 seconds  Estimated Blood Loss:  Estimated blood loss: none.      [facility]

## 2020-09-12 NOTE — Transfer of Care (Signed)
Immediate Anesthesia Transfer of Care Note  Patient: Robert Griffith  Procedure(s) Performed: COLONOSCOPY (Rectum) POLYPECTOMY (Rectum)  Patient Location: PACU  Anesthesia Type: General  Level of Consciousness: awake, alert  and patient cooperative  Airway and Oxygen Therapy: Patient Spontanous Breathing and Patient connected to supplemental oxygen  Post-op Assessment: Post-op Vital signs reviewed, Patient's Cardiovascular Status Stable, Respiratory Function Stable, Patent Airway and No signs of Nausea or vomiting  Post-op Vital Signs: Reviewed and stable  Complications: No notable events documented.

## 2020-09-12 NOTE — H&P (Signed)
Robert Lame, MD [address] [place],  [address] Phone:[phone] Fax : [phone]  Primary Care Physician:  Robert Cousins, MD Primary Gastroenterologist:  Dr. Allen Griffith  Pre-Procedure History & Physical: HPI:  Robert Griffith is Griffith 65 y.o. male is here for an colonoscopy.   Past Medical History:  Diagnosis Date   Diabetes mellitus without complication ([place])    Hyperlipidemia    Hypertension    Kidney stone    Obesity    Restless legs    Wears dentures    Has full upper and lower, only wears upper    Past Surgical History:  Procedure Laterality Date   left toe surgery     sleep apnea surgery     TONSILLECTOMY AND ADENOIDECTOMY      Prior to Admission medications   Medication Sig Start Date End Date Taking? Authorizing Provider  aspirin 81 MG EC tablet Take 81 mg by mouth daily.     Yes [provider]  benazepril (LOTENSIN) 40 MG tablet TAKE 1 TABLET BY MOUTH EVERY DAY 07/11/20  Yes Griffith, Avanti, MD  cetirizine (ZYRTEC) 10 MG tablet Take 1 tablet (10 mg total) by mouth daily. 11/30/19  Yes Robert Chapel A, NP  metFORMIN (GLUCOPHAGE) 500 MG tablet TAKE 2 TABLETS (1,000 MG TOTAL) BY MOUTH 2 (TWO) TIMES DAILY WITH Griffith MEAL. 10/09/19  Yes Robert Chapel A, NP  rosuvastatin (CRESTOR) 20 MG tablet TAKE 1 TABLET BY MOUTH EVERY DAY 06/27/20  Yes Griffith, Avanti, MD  Sod Picosulfate-Mag Ox-Cit Acd (CLENPIQ) 10-3.5-12 MG-GM -GM/160ML SOLN Take 320 mLs by mouth as directed. 07/19/20  Yes Robert Lame, MD  ONE TOUCH ULTRA TEST test strip USE TO TEST BLOOD SUGAR DAILY (EX E11.9) 04/24/16   Robert Maple, MD    Allergies as of 07/19/2020 - Review Complete 07/18/2020  Allergen Reaction Noted   Other Shortness Of Breath 05/20/2019    Family History  Problem Relation Age of Onset   Heart attack Mother    Heart disease Mother    Heart disease Father    Stroke Brother    Coronary artery disease Other        4 family members    Social History    Socioeconomic History   Marital status: Married    Spouse name: Not on file   Number of children: Not on file   Years of education: Not on file   Highest education level: Not on file  Occupational History   Not on file  Tobacco Use   Smoking status: Never   Smokeless tobacco: Never  Vaping Use   Vaping Use: Never used  Substance and Sexual Activity   Alcohol use: No   Drug use: No   Sexual activity: Not on file  Other Topics Concern   Not on file  Social History Narrative   Not on file   Social Determinants of Health   Financial Resource Strain: Not on file  Food Insecurity: Not on file  Transportation Needs: Not on file  Physical Activity: Not on file  Stress: Not on file  Social Connections: Not on file  Intimate Partner Violence: Not on file    Review of Systems: See HPI, otherwise negative ROS  Physical Exam: BP (!) 151/81   Pulse 71   Temp (!) 97.5 F (36.4 C) (Temporal)   Resp 18   Ht '5\' 8"'$  (1.727 m)   Wt 113.9 kg   SpO2 97%   BMI 38.16 kg/m  General:   Alert,  pleasant and cooperative in NAD Head:  Normocephalic and atraumatic. Neck:  Supple; no masses or thyromegaly. Lungs:  Clear throughout to auscultation.    Heart:  Regular rate and rhythm. Abdomen:  Soft, nontender and nondistended. Normal bowel sounds, without guarding, and without rebound.   Neurologic:  Alert and  oriented x4;  grossly normal neurologically.  Impression/Plan: Robert Griffith is here for an colonoscopy to be performed for Griffith history of adenomatous polyps on   Risks, benefits, limitations, and alternatives regarding  colonoscopy have been reviewed with the patient.  Questions have been answered.  All parties agreeable.   Robert Lame, MD  09/12/2020, 8:17 AM

## 2020-09-13 ENCOUNTER — Encounter: Payer: Self-pay | Admitting: Gastroenterology

## 2020-09-14 LAB — SURGICAL PATHOLOGY

## 2020-09-15 ENCOUNTER — Encounter: Payer: Self-pay | Admitting: Gastroenterology

## 2020-09-15 ENCOUNTER — Ambulatory Visit: Payer: BC Managed Care – PPO | Admitting: Internal Medicine

## 2020-09-28 ENCOUNTER — Other Ambulatory Visit: Payer: Self-pay

## 2020-09-28 ENCOUNTER — Encounter: Payer: Self-pay | Admitting: Internal Medicine

## 2020-09-28 ENCOUNTER — Ambulatory Visit: Payer: BC Managed Care – PPO | Admitting: Internal Medicine

## 2020-09-28 VITALS — BP 123/83 | HR 66 | Temp 98.9°F | Ht 68.11 in | Wt 255.4 lb

## 2020-09-28 DIAGNOSIS — R0602 Shortness of breath: Secondary | ICD-10-CM | POA: Diagnosis not present

## 2020-09-28 DIAGNOSIS — Z7722 Contact with and (suspected) exposure to environmental tobacco smoke (acute) (chronic): Secondary | ICD-10-CM | POA: Diagnosis not present

## 2020-09-28 DIAGNOSIS — Z23 Encounter for immunization: Secondary | ICD-10-CM | POA: Insufficient documentation

## 2020-09-28 DIAGNOSIS — B351 Tinea unguium: Secondary | ICD-10-CM

## 2020-09-28 DIAGNOSIS — R062 Wheezing: Secondary | ICD-10-CM

## 2020-09-28 MED ORDER — BENZONATATE 100 MG PO CAPS: 100.0000 mg | ORAL_CAPSULE | Freq: Two times a day (BID) | ORAL | 0 refills | Status: DC | PRN

## 2020-09-28 MED ORDER — ALBUTEROL SULFATE HFA 108 (90 BASE) MCG/ACT IN AERS: 2.0000 | INHALATION_SPRAY | Freq: Four times a day (QID) | RESPIRATORY_TRACT | 4 refills | Status: DC | PRN

## 2020-09-28 MED ORDER — BENAZEPRIL HCL 40 MG PO TABS: 40.0000 mg | ORAL_TABLET | Freq: Every day | ORAL | 5 refills | Status: DC

## 2020-09-28 MED ORDER — TERBINAFINE HCL 250 MG PO TABS: 250.0000 mg | ORAL_TABLET | Freq: Every day | ORAL | 3 refills | Status: DC

## 2020-09-28 MED ORDER — CETIRIZINE HCL 10 MG PO TABS: 10.0000 mg | ORAL_TABLET | Freq: Every day | ORAL | 2 refills | Status: DC

## 2020-09-28 MED ORDER — SPIRIVA RESPIMAT 2.5 MCG/ACT IN AERS: 2.0000 | INHALATION_SPRAY | Freq: Every day | RESPIRATORY_TRACT | 2 refills | Status: DC

## 2020-09-28 NOTE — Progress Notes (Signed)
BP 123/83   Pulse 66   Temp 98.9 F (37.2 C) (Oral)   Ht 5' 8.11" (1.73 m)   Wt 255 lb 6.4 oz (115.8 kg)   SpO2 98%   BMI 38.71 kg/m    Subjective:    Patient ID: Robert Griffith, male    DOB: 13-Aug-1955, 65 y.o.   MRN: [id number]  Chief Complaint  Patient presents with   Diabetes   Toe Pain    Right big toe pain for the past few weeks.     HPI: Robert Griffith is a 65 y.o. male  Pt is here for a fu , he has a ho DM/ HTN   Diabetes He presents for his follow-up (a1c 6.9 is on metformin 1000 mg bid) diabetic visit. He has type 2 diabetes mellitus. Pertinent negatives for hypoglycemia include no confusion, dizziness, headaches, nervousness/anxiousness or speech difficulty. Pertinent negatives for diabetes include no chest pain, no fatigue, no polydipsia, no polyphagia, no polyuria and no weakness.  Toe Pain  The incident occurred 3 to 5 days ago (rt big toe nail has onychomycosis). Pertinent negatives include no numbness.  Hypertension This is a chronic (is on 40 mg benazapril) problem. The current episode started more than 1 year ago. The problem is unchanged. Associated symptoms include shortness of breath. Pertinent negatives include no chest pain, headaches or palpitations.  Shortness of Breath This is a chronic problem. The current episode started more than 1 year ago. Associated symptoms include sputum production and wheezing. Pertinent negatives include no abdominal pain, chest pain, fever, headaches, hemoptysis, leg swelling, rash, rhinorrhea, sore throat, swollen glands, syncope or vomiting.   Chief Complaint  Patient presents with   Diabetes   Toe Pain    Right big toe pain for the past few weeks.     Relevant past medical, surgical, family and social history reviewed and updated as indicated. Interim medical history since our last visit reviewed. Allergies and medications reviewed and updated.  Review of Systems  Constitutional:  Negative for activity  change, appetite change, chills, fatigue and fever.  HENT:  Negative for congestion, facial swelling, rhinorrhea and sore throat.   Eyes:  Negative for visual disturbance.  Respiratory:  Positive for cough, sputum production, shortness of breath and wheezing. Negative for apnea, hemoptysis and chest tightness.   Cardiovascular:  Negative for chest pain, palpitations, leg swelling and syncope.  Gastrointestinal:  Negative for abdominal distention, abdominal pain, diarrhea, nausea and vomiting.  Endocrine: Negative for cold intolerance, heat intolerance, polydipsia, polyphagia and polyuria.  Genitourinary:  Negative for difficulty urinating, dysuria, frequency, hematuria and urgency.  Skin:  Negative for color change and rash.  Neurological:  Negative for dizziness, speech difficulty, weakness, light-headedness, numbness and headaches.  Psychiatric/Behavioral:  Negative for behavioral problems and confusion. The patient is not nervous/anxious.    Per HPI unless specifically indicated above     Objective:    BP 123/83   Pulse 66   Temp 98.9 F (37.2 C) (Oral)   Ht 5' 8.11" (1.73 m)   Wt 255 lb 6.4 oz (115.8 kg)   SpO2 98%   BMI 38.71 kg/m   Wt Readings from Last 3 Encounters:  09/28/20 255 lb 6.4 oz (115.8 kg)  09/12/20 251 lb (113.9 kg)  07/18/20 261 lb 6.4 oz (118.6 kg)    Physical Exam Vitals and nursing note reviewed.  Constitutional:      General: He is not in acute distress.    Appearance: Normal  appearance. He is not ill-appearing or diaphoretic.  HENT:     Head: Normocephalic and atraumatic.     Right Ear: Tympanic membrane and external ear normal. There is no impacted cerumen.     Left Ear: External ear normal.     Nose: No congestion or rhinorrhea.     Mouth/Throat:     Pharynx: No oropharyngeal exudate or posterior oropharyngeal erythema.  Eyes:     Conjunctiva/sclera: Conjunctivae normal.     Pupils: Pupils are equal, round, and reactive to light.   Cardiovascular:     Rate and Rhythm: Normal rate and regular rhythm.     Heart sounds: No murmur heard.   No friction rub. No gallop.  Pulmonary:     Effort: No respiratory distress.     Breath sounds: No stridor. No wheezing or rhonchi.  Chest:     Chest wall: No tenderness.  Abdominal:     General: Abdomen is flat. Bowel sounds are normal.     Palpations: Abdomen is soft. There is no mass.     Tenderness: There is no abdominal tenderness.  Musculoskeletal:     Cervical back: Normal range of motion and neck supple. No rigidity or tenderness.     Left lower leg: No edema.  Skin:    General: Skin is warm and dry.  Neurological:     Mental Status: He is alert.     Cranial Nerves: No cranial nerve deficit.    Results for orders placed or performed during the hospital encounter of 09/12/20  Glucose, capillary  Result Value Ref Range   Glucose-Capillary 134 (H) 70 - 99 mg/dL  Glucose, capillary  Result Value Ref Range   Glucose-Capillary 125 (H) 70 - 99 mg/dL  Surgical pathology  Result Value Ref Range   SURGICAL PATHOLOGY      SURGICAL PATHOLOGY CASE: [id number] PATIENT: [facility] Surgical Pathology Report     Specimen Submitted: A. Colon polyp, ascending; cold forceps  Clinical History: Screening colonoscopy.  Colon polyp    DIAGNOSIS: A. COLON POLYP, ASCENDING; COLD BIOPSY: - TUBULAR ADENOMA. - NEGATIVE FOR HIGH-GRADE DYSPLASIA AND MALIGNANCY.  GROSS DESCRIPTION: A. Labeled: Ascending colon polyp Received: Formalin Collection time: 8:57 AM on 09/12/2020 Placed into formalin time: 8:57 AM on 09/12/2020 Tissue fragment(s): 1 Size: 0.3 x 0.2 x 0.1 cm Description: Tan soft tissue fragment Entirely submitted in 1 cassette.  [facility] 09/13/2020  Final Diagnosis performed by Allena Napoleon, MD.   Electronically signed 09/14/2020 10:47:36AM The electronic signature indicates that the named Attending Pathologist has evaluated the specimen Technical component  performed at [facility], [address], [place], [place] [address] Lab: [phone] Dir: Rush Farmer, MD, MMM  Professional comp onent performed at [facility], [facility], [place], [place],  [address] Lab: [phone] Dir: Dellia Nims. Rubinas, MD         Current Outpatient Medications:    albuterol (VENTOLIN HFA) 108 (90 Base) MCG/ACT inhaler, Inhale 2 puffs into the lungs every 6 (six) hours as needed for wheezing or shortness of breath., Disp: 8 g, Rfl: 4   aspirin 81 MG EC tablet, Take 81 mg by mouth daily.  , Disp: , Rfl:    benzonatate (TESSALON) 100 MG capsule, Take 1 capsule (100 mg total) by mouth 2 (two) times daily as needed for cough., Disp: 20 capsule, Rfl: 0   metFORMIN (GLUCOPHAGE) 500 MG tablet, TAKE 2 TABLETS (1,000 MG TOTAL) BY MOUTH 2 (TWO) TIMES DAILY WITH A MEAL., Disp: 360  tablet, Rfl: 1   ONE TOUCH ULTRA TEST test strip, USE TO TEST BLOOD SUGAR DAILY (EX E11.9), Disp: 100 each, Rfl: 4   rosuvastatin (CRESTOR) 20 MG tablet, TAKE 1 TABLET BY MOUTH EVERY DAY, Disp: 90 tablet, Rfl: 1   terbinafine (LAMISIL) 250 MG tablet, Take 1 tablet (250 mg total) by mouth daily., Disp: 30 tablet, Rfl: 3   Tiotropium Bromide Monohydrate (SPIRIVA RESPIMAT) 2.5 MCG/ACT AERS, Inhale 2 puffs into the lungs daily., Disp: 1 each, Rfl: 2   benazepril (LOTENSIN) 40 MG tablet, Take 1 tablet (40 mg total) by mouth daily., Disp: 90 tablet, Rfl: 5   cetirizine (ZYRTEC) 10 MG tablet, Take 1 tablet (10 mg total) by mouth daily., Disp: 90 tablet, Rfl: 2    Assessment & Plan:  DM  Is on metformin for such  check HbA1c,  urine  microalbumin  diabetic diet plan given to pt  adviced regarding hypoglycemia and instructions given to pt today on how to prevent and treat the same if it were to occur. pt acknowledges the plan and voices understanding of the same.  exercise plan given and encouraged.   advice diabetic yearly podiatry, ophthalmology , nutritionist , dental check q 6  months,   2. HTN : Htn continue benazepril  Continue current meds.  Medication compliance emphasised. pt advised to keep Bp logs. Pt verbalised understanding of the same. Pt to have a low salt diet . Exercise to reach a goal of at least 150 mins a week.  lifestyle modifications explained and pt understands importance of the above.  Problem List Items Addressed This Visit       Musculoskeletal and Integument   Onychomycosis   Relevant Medications   terbinafine (LAMISIL) 250 MG tablet   Other Relevant Orders   Ambulatory referral to Podiatry     Other   SOB (shortness of breath) on exertion   Relevant Orders   Ambulatory referral to Pulmonology   Second hand smoke exposure   Need for Tdap vaccination   Relevant Orders   Flu Vaccine QUAD High Dose(Fluad) (Completed)   Tdap vaccine greater than or equal to 7yo IM (Completed)   Wheezing   Other Visit Diagnoses     Need for influenza vaccination    -  Primary   Relevant Orders   Flu Vaccine QUAD High Dose(Fluad) (Completed)   Tdap vaccine greater than or equal to 7yo IM (Completed)        Orders Placed This Encounter  Procedures   Flu Vaccine QUAD High Dose(Fluad)   Tdap vaccine greater than or equal to 7yo IM   Ambulatory referral to Podiatry   Ambulatory referral to Pulmonology     Meds ordered this encounter  Medications   benazepril (LOTENSIN) 40 MG tablet    Sig: Take 1 tablet (40 mg total) by mouth daily.    Dispense:  90 tablet    Refill:  5   cetirizine (ZYRTEC) 10 MG tablet    Sig: Take 1 tablet (10 mg total) by mouth daily.    Dispense:  90 tablet    Refill:  2   albuterol (VENTOLIN HFA) 108 (90 Base) MCG/ACT inhaler    Sig: Inhale 2 puffs into the lungs every 6 (six) hours as needed for wheezing or shortness of breath.    Dispense:  8 g    Refill:  4   terbinafine (LAMISIL) 250 MG tablet    Sig: Take 1 tablet (250 mg total) by mouth daily.  Dispense:  30 tablet    Refill:  3   Tiotropium Bromide  Monohydrate (SPIRIVA RESPIMAT) 2.5 MCG/ACT AERS    Sig: Inhale 2 puffs into the lungs daily.    Dispense:  1 each    Refill:  2   benzonatate (TESSALON) 100 MG capsule    Sig: Take 1 capsule (100 mg total) by mouth 2 (two) times daily as needed for cough.    Dispense:  20 capsule    Refill:  0     Follow up plan: Return in about 6 weeks (around 11/09/2020).   Labs next visit : CBC, CMP, FLP, HBA1C, TSH, PSA, urine microalbumin Labs 1 week prior to next visit.

## 2020-09-29 DIAGNOSIS — R062 Wheezing: Secondary | ICD-10-CM | POA: Insufficient documentation

## 2020-10-13 ENCOUNTER — Ambulatory Visit: Payer: BC Managed Care – PPO | Admitting: Podiatry

## 2020-10-13 ENCOUNTER — Other Ambulatory Visit: Payer: Self-pay

## 2020-10-13 DIAGNOSIS — Q667 Congenital pes cavus, unspecified foot: Secondary | ICD-10-CM

## 2020-10-17 ENCOUNTER — Other Ambulatory Visit: Payer: Self-pay

## 2020-10-18 ENCOUNTER — Other Ambulatory Visit: Payer: Self-pay

## 2020-10-18 DIAGNOSIS — R0602 Shortness of breath: Secondary | ICD-10-CM

## 2020-10-18 NOTE — Progress Notes (Signed)
Subjective:  Patient ID: Robert Griffith, male    DOB: 03-11-1955,  MRN: [id number]  Chief Complaint  Patient presents with   Nail Problem    Discolored nails      65 y.o. male presents with the above complaint.  Patient presents today complaining of pes cavus deformity as well as discolored nails.  Patient states that he wanted to know if there is any treatment options for discolored nail.  They have been thick dystrophic elongated for a long period of time.  He is tried some over-the-counter stuff none of which has helped.  He also has high arch foot structure he would like to discuss shoe gear modification orthotics.  He denies any other acute issues.  He sometimes gets arch pain.  This is especially when he is being on his feet for a long period of time.   Review of Systems: Negative except as noted in the HPI. Denies N/V/F/Ch.  Past Medical History:  Diagnosis Date   Diabetes mellitus without complication ([place])    Hyperlipidemia    Hypertension    Kidney stone    Obesity    Restless legs    Wears dentures    Has full upper and lower, only wears upper    Current Outpatient Medications:    albuterol (VENTOLIN HFA) 108 (90 Base) MCG/ACT inhaler, Inhale 2 puffs into the lungs every 6 (six) hours as needed for wheezing or shortness of breath., Disp: 8 g, Rfl: 4   aspirin 81 MG EC tablet, Take 81 mg by mouth daily.  , Disp: , Rfl:    benazepril (LOTENSIN) 40 MG tablet, Take 1 tablet (40 mg total) by mouth daily., Disp: 90 tablet, Rfl: 5   benzonatate (TESSALON) 100 MG capsule, Take 1 capsule (100 mg total) by mouth 2 (two) times daily as needed for cough., Disp: 20 capsule, Rfl: 0   cetirizine (ZYRTEC) 10 MG tablet, Take 1 tablet (10 mg total) by mouth daily., Disp: 90 tablet, Rfl: 2   metFORMIN (GLUCOPHAGE) 500 MG tablet, TAKE 2 TABLETS (1,000 MG TOTAL) BY MOUTH 2 (TWO) TIMES DAILY WITH A MEAL., Disp: 360 tablet, Rfl: 1   ONE TOUCH ULTRA TEST test strip, USE TO TEST BLOOD SUGAR  DAILY (EX E11.9), Disp: 100 each, Rfl: 4   rosuvastatin (CRESTOR) 20 MG tablet, TAKE 1 TABLET BY MOUTH EVERY DAY, Disp: 90 tablet, Rfl: 1   terbinafine (LAMISIL) 250 MG tablet, Take 1 tablet (250 mg total) by mouth daily., Disp: 30 tablet, Rfl: 3   Tiotropium Bromide Monohydrate (SPIRIVA RESPIMAT) 2.5 MCG/ACT AERS, Inhale 2 puffs into the lungs daily., Disp: 1 each, Rfl: 2  Social History   Tobacco Use  Smoking Status Never  Smokeless Tobacco Never    Allergies  Allergen Reactions   Other     Bananas: indigestion   Objective:  There were no vitals filed for this visit. There is no height or weight on file to calculate BMI. Constitutional Well developed. Well nourished.  Vascular Dorsalis pedis pulses palpable bilaterally. Posterior tibial pulses palpable bilaterally. Capillary refill normal to all digits.  No cyanosis or clubbing noted. Pedal hair growth normal.  Neurologic Normal speech. Oriented to person, place, and time. Epicritic sensation to light touch grossly present bilaterally.  Dermatologic Slightly thickened elongated dystrophic nail noted x10 No open wounds. No skin lesions.  Orthopedic: Gait examination shows high arch foot structure with pes cavus foot type.  Coleman block test shows that it is a anterior cavus foot  structure.  No other bony abnormalities identified.  No other deformities noted.   Radiographs: None Assessment:   1. Pes cavus    Plan:  Patient was evaluated and treated and all questions answered.  Bilateral onychodystrophy -I explained to the patient the etiology of onychodystrophy and various treatment options were discussed.  At this time patient would like to hold off on any kind of intervention and would like to think about it they are warranted.  Pes cavus -I explained to the patient the etiology of pes cavus and various treatment options were extensively discussed.  Given the foot structure that he has in the setting of arch pain I  believe patient would benefit from custom-made orthotics.  He would like to bring it think about custom orthotics and will make an appointment to schedule for orthotics if needed.  No follow-ups on file.

## 2020-11-03 ENCOUNTER — Other Ambulatory Visit: Payer: BC Managed Care – PPO

## 2020-11-03 ENCOUNTER — Other Ambulatory Visit: Payer: Self-pay

## 2020-11-03 DIAGNOSIS — Z1329 Encounter for screening for other suspected endocrine disorder: Secondary | ICD-10-CM

## 2020-11-03 DIAGNOSIS — E7800 Pure hypercholesterolemia, unspecified: Secondary | ICD-10-CM

## 2020-11-03 DIAGNOSIS — I10 Essential (primary) hypertension: Secondary | ICD-10-CM

## 2020-11-03 DIAGNOSIS — E1169 Type 2 diabetes mellitus with other specified complication: Secondary | ICD-10-CM

## 2020-11-03 LAB — MICROALBUMIN, URINE WAIVED
Creatinine, Urine Waived: 100 mg/dL (ref 10–300)
Microalb, Ur Waived: 30 mg/L — ABNORMAL HIGH (ref 0–19)
Microalb/Creat Ratio: <30 mg/g (ref <30)

## 2020-11-03 LAB — BAYER DCA HB A1C WAIVED: HB A1C (BAYER DCA - WAIVED): 6.3 % — ABNORMAL HIGH (ref 4.8–5.6)

## 2020-11-04 LAB — LIPID PANEL
Chol/HDL Ratio: 3.5 ratio (ref 0.0–5.0)
Cholesterol, Total: 138 mg/dL (ref 100–199)
HDL: 40 mg/dL (ref >39)
LDL Chol Calc (NIH): 59 mg/dL (ref 0–99)
Triglycerides: 247 mg/dL — ABNORMAL HIGH (ref 0–149)
VLDL Cholesterol Cal: 39 mg/dL (ref 5–40)

## 2020-11-04 LAB — CBC WITH DIFFERENTIAL/PLATELET
Basophils Absolute: 0.1 10*3/uL (ref 0.0–0.2)
Basos: 1 %
EOS (ABSOLUTE): 0.4 10*3/uL (ref 0.0–0.4)
Eos: 5 %
Hematocrit: 46.7 % (ref 37.5–51.0)
Hemoglobin: 15.5 g/dL (ref 13.0–17.7)
Immature Grans (Abs): 0 10*3/uL (ref 0.0–0.1)
Immature Granulocytes: 0 %
Lymphocytes Absolute: 2.2 10*3/uL (ref 0.7–3.1)
Lymphs: 31 %
MCH: 29.8 pg (ref 26.6–33.0)
MCHC: 33.2 g/dL (ref 31.5–35.7)
MCV: 90 fL (ref 79–97)
Monocytes Absolute: 0.5 10*3/uL (ref 0.1–0.9)
Monocytes: 7 %
Neutrophils Absolute: 3.9 10*3/uL (ref 1.4–7.0)
Neutrophils: 56 %
Platelets: 176 10*3/uL (ref 150–450)
RBC: 5.21 x10E6/uL (ref 4.14–5.80)
RDW: 13.1 % (ref 11.6–15.4)
WBC: 7.1 10*3/uL (ref 3.4–10.8)

## 2020-11-04 LAB — COMPREHENSIVE METABOLIC PANEL
ALT: 34 IU/L (ref 0–44)
AST: 30 IU/L (ref 0–40)
Albumin/Globulin Ratio: 1.8 (ref 1.2–2.2)
Albumin: 4.7 g/dL (ref 3.8–4.8)
Alkaline Phosphatase: 83 IU/L (ref 44–121)
BUN/Creatinine Ratio: 16 (ref 10–24)
BUN: 14 mg/dL (ref 8–27)
Bilirubin Total: 0.6 mg/dL (ref 0.0–1.2)
CO2: 21 mmol/L (ref 20–29)
Calcium: 9.5 mg/dL (ref 8.6–10.2)
Chloride: 100 mmol/L (ref 96–106)
Creatinine, Ser: 0.85 mg/dL (ref 0.76–1.27)
Globulin, Total: 2.6 g/dL (ref 1.5–4.5)
Glucose: 125 mg/dL — ABNORMAL HIGH (ref 70–99)
Potassium: 4.7 mmol/L (ref 3.5–5.2)
Sodium: 136 mmol/L (ref 134–144)
Total Protein: 7.3 g/dL (ref 6.0–8.5)
eGFR: 96 mL/min/{1.73_m2} (ref >59)

## 2020-11-04 LAB — THYROID PANEL WITH TSH
Free Thyroxine Index: 1.8 (ref 1.2–4.9)
T3 Uptake Ratio: 30 % (ref 24–39)
T4, Total: 6.1 ug/dL (ref 4.5–12.0)
TSH: 2.94 u[IU]/mL (ref 0.450–4.500)

## 2020-11-04 LAB — PSA TOTAL+% FREE (SERIAL)
PSA, Free Pct: 41.1 %
PSA, Free: 0.78 ng/mL
Prostate Specific Ag, Serum: 1.9 ng/mL (ref 0.0–4.0)

## 2020-11-09 ENCOUNTER — Encounter: Payer: Self-pay | Admitting: Internal Medicine

## 2020-11-09 ENCOUNTER — Ambulatory Visit: Payer: BC Managed Care – PPO | Admitting: Internal Medicine

## 2020-11-09 ENCOUNTER — Other Ambulatory Visit: Payer: Self-pay

## 2020-11-09 VITALS — BP 118/62 | HR 76 | Temp 98.1°F | Ht 68.5 in | Wt 259.6 lb

## 2020-11-09 DIAGNOSIS — R053 Chronic cough: Secondary | ICD-10-CM | POA: Diagnosis not present

## 2020-11-09 DIAGNOSIS — E7800 Pure hypercholesterolemia, unspecified: Secondary | ICD-10-CM | POA: Diagnosis not present

## 2020-11-09 DIAGNOSIS — R0602 Shortness of breath: Secondary | ICD-10-CM

## 2020-11-09 DIAGNOSIS — E119 Type 2 diabetes mellitus without complications: Secondary | ICD-10-CM

## 2020-11-09 MED ORDER — LOSARTAN POTASSIUM 25 MG PO TABS: 25.0000 mg | ORAL_TABLET | Freq: Every day | ORAL | 3 refills | Status: DC

## 2020-11-09 MED ORDER — ROSUVASTATIN CALCIUM 40 MG PO TABS: 40.0000 mg | ORAL_TABLET | Freq: Every day | ORAL | 4 refills | Status: DC

## 2020-11-09 MED ORDER — FEXOFENADINE HCL 180 MG PO TABS: 180.0000 mg | ORAL_TABLET | Freq: Every day | ORAL | 1 refills | Status: DC

## 2020-11-09 NOTE — Progress Notes (Signed)
BP 118/62   Pulse 76   Temp 98.1 F (36.7 C) (Oral)   Ht 5' 8.5" (1.74 m)   Wt 259 lb 9.6 oz (117.8 kg)   SpO2 98%   BMI 38.90 kg/m    Subjective:    Patient ID: Robert Griffith, male    DOB: 1955/10/01, 65 y.o.   MRN: [id number]  Chief Complaint  Patient presents with   Diabetes   Hyperlipidemia   Hypertension    HPI: Robert Griffith is a 65 y.o. male  Diabetes He presents for his follow-up diabetic visit. He has type 2 diabetes mellitus. Pertinent negatives for hypoglycemia include no headaches. Pertinent negatives for diabetes include no blurred vision and no chest pain.  Hyperlipidemia This is a chronic problem. The problem is uncontrolled. Pertinent negatives include no chest pain.  Hypertension This is a chronic problem. The current episode started more than 1 year ago. The problem has been waxing and waning since onset. The problem is controlled. Pertinent negatives include no anxiety, blurred vision, chest pain, headaches, malaise/fatigue, neck pain, orthopnea, palpitations or peripheral edema.   Chief Complaint  Patient presents with   Diabetes   Hyperlipidemia   Hypertension    Relevant past medical, surgical, family and social history reviewed and updated as indicated. Interim medical history since our last visit reviewed. Allergies and medications reviewed and updated.  Review of Systems  Constitutional:  Negative for malaise/fatigue.  Eyes:  Negative for blurred vision.  Cardiovascular:  Negative for chest pain, palpitations and orthopnea.  Musculoskeletal:  Negative for neck pain.  Neurological:  Negative for headaches.   Per HPI unless specifically indicated above     Objective:    BP 118/62   Pulse 76   Temp 98.1 F (36.7 C) (Oral)   Ht 5' 8.5" (1.74 m)   Wt 259 lb 9.6 oz (117.8 kg)   SpO2 98%   BMI 38.90 kg/m   Wt Readings from Last 3 Encounters:  11/09/20 259 lb 9.6 oz (117.8 kg)  09/28/20 255 lb 6.4 oz (115.8 kg)  09/12/20 251  lb (113.9 kg)    Physical Exam Vitals and nursing note reviewed.  Constitutional:      General: He is not in acute distress.    Appearance: Normal appearance. He is not ill-appearing or diaphoretic.  HENT:     Head: Normocephalic and atraumatic.     Right Ear: Tympanic membrane and external ear normal. There is no impacted cerumen.     Left Ear: External ear normal.     Nose: No congestion or rhinorrhea.     Mouth/Throat:     Pharynx: No oropharyngeal exudate or posterior oropharyngeal erythema.  Eyes:     Conjunctiva/sclera: Conjunctivae normal.     Pupils: Pupils are equal, round, and reactive to light.  Cardiovascular:     Rate and Rhythm: Normal rate and regular rhythm.     Heart sounds: No murmur heard.   No friction rub. No gallop.  Pulmonary:     Effort: No respiratory distress.     Breath sounds: No stridor. No wheezing or rhonchi.  Chest:     Chest wall: No tenderness.  Abdominal:     General: Abdomen is flat. Bowel sounds are normal.     Palpations: Abdomen is soft. There is no mass.     Tenderness: There is no abdominal tenderness.  Musculoskeletal:     Cervical back: Normal range of motion and neck supple. No rigidity or tenderness.  Left lower leg: No edema.  Skin:    General: Skin is warm and dry.  Neurological:     Mental Status: He is alert.     Cranial Nerves: No cranial nerve deficit.     Sensory: No sensory deficit.     Motor: No weakness.     Coordination: Coordination normal.     Gait: Gait normal.     Deep Tendon Reflexes: Reflexes normal.  Psychiatric:        Mood and Affect: Mood normal.        Behavior: Behavior normal.        Thought Content: Thought content normal.    Results for orders placed or performed in visit on 11/03/20  Microalbumin, Urine Waived  Result Value Ref Range   Microalb, Ur Waived 30 (H) 0 - 19 mg/L   Creatinine, Urine Waived 100 10 - 300 mg/dL   Microalb/Creat Ratio <30 <30 mg/g  Lipid panel  Result Value Ref  Range   Cholesterol, Total 138 100 - 199 mg/dL   Triglycerides 247 (H) 0 - 149 mg/dL   HDL 40 >39 mg/dL   VLDL Cholesterol Cal 39 5 - 40 mg/dL   LDL Chol Calc (NIH) 59 0 - 99 mg/dL   Chol/HDL Ratio 3.5 0.0 - 5.0 ratio  Bayer DCA Hb A1c Waived  Result Value Ref Range   HB A1C (BAYER DCA - WAIVED) 6.3 (H) 4.8 - 5.6 %  CBC with Differential/Platelet  Result Value Ref Range   WBC 7.1 3.4 - 10.8 x10E3/uL   RBC 5.21 4.14 - 5.80 x10E6/uL   Hemoglobin 15.5 13.0 - 17.7 g/dL   Hematocrit 46.7 37.5 - 51.0 %   MCV 90 79 - 97 fL   MCH 29.8 26.6 - 33.0 pg   MCHC 33.2 31.5 - 35.7 g/dL   RDW 13.1 11.6 - 15.4 %   Platelets 176 150 - 450 x10E3/uL   Neutrophils 56 Not Estab. %   Lymphs 31 Not Estab. %   Monocytes 7 Not Estab. %   Eos 5 Not Estab. %   Basos 1 Not Estab. %   Neutrophils Absolute 3.9 1.4 - 7.0 x10E3/uL   Lymphocytes Absolute 2.2 0.7 - 3.1 x10E3/uL   Monocytes Absolute 0.5 0.1 - 0.9 x10E3/uL   EOS (ABSOLUTE) 0.4 0.0 - 0.4 x10E3/uL   Basophils Absolute 0.1 0.0 - 0.2 x10E3/uL   Immature Granulocytes 0 Not Estab. %   Immature Grans (Abs) 0.0 0.0 - 0.1 x10E3/uL  Comprehensive metabolic panel  Result Value Ref Range   Glucose 125 (H) 70 - 99 mg/dL   BUN 14 8 - 27 mg/dL   Creatinine, Ser 0.85 0.76 - 1.27 mg/dL   eGFR 96 >59 mL/min/1.73   BUN/Creatinine Ratio 16 10 - 24   Sodium 136 134 - 144 mmol/L   Potassium 4.7 3.5 - 5.2 mmol/L   Chloride 100 96 - 106 mmol/L   CO2 21 20 - 29 mmol/L   Calcium 9.5 8.6 - 10.2 mg/dL   Total Protein 7.3 6.0 - 8.5 g/dL   Albumin 4.7 3.8 - 4.8 g/dL   Globulin, Total 2.6 1.5 - 4.5 g/dL   Albumin/Globulin Ratio 1.8 1.2 - 2.2   Bilirubin Total 0.6 0.0 - 1.2 mg/dL   Alkaline Phosphatase 83 44 - 121 IU/L   AST 30 0 - 40 IU/L   ALT 34 0 - 44 IU/L  Thyroid Panel With TSH  Result Value Ref Range   TSH 2.940 0.450 -  4.500 uIU/mL   T4, Total 6.1 4.5 - 12.0 ug/dL   T3 Uptake Ratio 30 24 - 39 %   Free Thyroxine Index 1.8 1.2 - 4.9  PSA Total+%Free  (Serial)  Result Value Ref Range   Prostate Specific Ag, Serum 1.9 0.0 - 4.0 ng/mL   PSA, Free 0.78 N/A ng/mL   PSA, Free Pct 41.1 %        Current Outpatient Medications:    aspirin 81 MG EC tablet, Take 81 mg by mouth daily.  , Disp: , Rfl:    fexofenadine (ALLEGRA ALLERGY) 180 MG tablet, Take 1 tablet (180 mg total) by mouth daily., Disp: 30 tablet, Rfl: 1   losartan (COZAAR) 25 MG tablet, Take 1 tablet (25 mg total) by mouth daily., Disp: 30 tablet, Rfl: 3   metFORMIN (GLUCOPHAGE) 500 MG tablet, TAKE 2 TABLETS (1,000 MG TOTAL) BY MOUTH 2 (TWO) TIMES DAILY WITH A MEAL., Disp: 360 tablet, Rfl: 1   ONE TOUCH ULTRA TEST test strip, USE TO TEST BLOOD SUGAR DAILY (EX E11.9), Disp: 100 each, Rfl: 4   albuterol (VENTOLIN HFA) 108 (90 Base) MCG/ACT inhaler, Inhale 2 puffs into the lungs every 6 (six) hours as needed for wheezing or shortness of breath. (Patient not taking: Reported on 11/09/2020), Disp: 8 g, Rfl: 4   rosuvastatin (CRESTOR) 40 MG tablet, Take 1 tablet (40 mg total) by mouth daily., Disp: 30 tablet, Rfl: 4   Tiotropium Bromide Monohydrate (SPIRIVA RESPIMAT) 2.5 MCG/ACT AERS, Inhale 2 puffs into the lungs daily. (Patient not taking: Reported on 11/09/2020), Disp: 1 each, Rfl: 2    Assessment & Plan:  Dm : a1c better at 6.3  check HbA1c,  urine  microalbumin  diabetic diet plan given to pt  adviced regarding hypoglycemia and instructions given to pt today on how to prevent and treat the same if it were to occur. pt acknowledges the plan and voices understanding of the same.  exercise plan given and encouraged.   advice diabetic yearly podiatry, ophthalmology , nutritionist , dental check q 6 months,  SOBOE will need to check Pfts Will need to check notes from last referrla, per the recor,  He cancelled this appt. / referral.  Use spiriva and albuterol as rx.  Change benazapril to losartan 25 mg. ? Sec to ACE inhibitors.    Obesity Lifestyle modifications advised to pt. A1c at    Portion control and avoiding high carb low fat diet advised.  Diet plan given to pt   exercise plan given and encouraged.  To increase exercise to 150 mins a week ie 21/2 hours a week. Pt verbalises understanding of the above.   Body mass index is 38.9 kg/m.    Problem List Items Addressed This Visit       Endocrine   Diabetes mellitus without complication ([facility])   Relevant Medications   rosuvastatin (CRESTOR) 40 MG tablet   losartan (COZAAR) 25 MG tablet     Other   Hypercholesteremia - Primary   Relevant Medications   rosuvastatin (CRESTOR) 40 MG tablet   losartan (COZAAR) 25 MG tablet   SOB (shortness of breath)   Relevant Orders   Ambulatory referral to Pulmonology   Chronic cough   Relevant Orders   Ambulatory referral to Pulmonology     Orders Placed This Encounter  Procedures   Ambulatory referral to Pulmonology     Meds ordered this encounter  Medications   rosuvastatin (CRESTOR) 40 MG tablet    Sig:  Take 1 tablet (40 mg total) by mouth daily.    Dispense:  30 tablet    Refill:  4   fexofenadine (ALLEGRA ALLERGY) 180 MG tablet    Sig: Take 1 tablet (180 mg total) by mouth daily.    Dispense:  30 tablet    Refill:  1   losartan (COZAAR) 25 MG tablet    Sig: Take 1 tablet (25 mg total) by mouth daily.    Dispense:  30 tablet    Refill:  3     Follow up plan: Return in about 6 weeks (around 12/21/2020).

## 2020-12-05 ENCOUNTER — Other Ambulatory Visit: Payer: Self-pay | Admitting: Internal Medicine

## 2020-12-05 NOTE — Telephone Encounter (Signed)
Requested medication (s) are due for refill today: has one refill available  Requested medication (s) are on the active medication list: yes  Last refill:  11/9, written 11/9 with one refill  Future visit scheduled: 12/19/20  Notes to clinic:  has refill remaining.   Requested Prescriptions  Pending Prescriptions Disp Refills   fexofenadine (ALLEGRA) 180 MG tablet [Pharmacy Med Name: FEXOFENADINE HCL 180 MG TABLET] 30 tablet 1    Sig: TAKE 1 TABLET BY MOUTH EVERY DAY     Ear, Nose, and Throat:  Antihistamines Passed - 12/05/2020 12:31 PM      Passed - Valid encounter within last 12 months    Recent Outpatient Visits           3 weeks ago [place] Vigg, Avanti, MD   2 months ago Need for influenza vaccination   [facility] Vigg, Avanti, MD   4 months ago Diabetes mellitus without complication [facility])   [facility] Vigg, Avanti, MD   1 year ago Annual physical exam   [facility] Eulogio Bear, NP   1 year ago [place], Lilia Argue, [place]       Future Appointments             In 2 weeks Vigg, Avanti, MD [facility], [place]

## 2020-12-14 ENCOUNTER — Other Ambulatory Visit: Payer: Self-pay

## 2020-12-14 DIAGNOSIS — E1169 Type 2 diabetes mellitus with other specified complication: Secondary | ICD-10-CM

## 2020-12-14 MED ORDER — GLUCOSE BLOOD VI STRP: ORAL_STRIP | 12 refills | Status: DC

## 2020-12-16 ENCOUNTER — Other Ambulatory Visit: Payer: Self-pay | Admitting: Internal Medicine

## 2020-12-16 DIAGNOSIS — E7800 Pure hypercholesterolemia, unspecified: Secondary | ICD-10-CM

## 2020-12-16 NOTE — Telephone Encounter (Signed)
Requested Prescriptions  Pending Prescriptions Disp Refills   rosuvastatin (CRESTOR) 20 MG tablet [Pharmacy Med Name: ROSUVASTATIN CALCIUM 20 MG TAB] 90 tablet 1    Sig: TAKE 1 TABLET BY MOUTH EVERY DAY     Cardiovascular:  Antilipid - Statins Failed - 12/16/2020  1:37 AM      Failed - Triglycerides in normal range and within 360 days    Triglycerides  Date Value Ref Range Status  11/03/2020 247 (H) 0 - 149 mg/dL Final   Triglycerides Piccolo,Waived  Date Value Ref Range Status  09/04/2018 169 (H) <150 mg/dL Final    Comment:                            Normal                   <150                         Borderline High     150 - 199                         High                200 - 499                         Very High                >499          Passed - Total Cholesterol in normal range and within 360 days    Cholesterol, Total  Date Value Ref Range Status  11/03/2020 138 100 - 199 mg/dL Final   Cholesterol Piccolo, Waived  Date Value Ref Range Status  09/04/2018 120 <200 mg/dL Final    Comment:                            Desirable                <200                         Borderline High      200- 239                         High                     >239          Passed - LDL in normal range and within 360 days    LDL Chol Calc (NIH)  Date Value Ref Range Status  11/03/2020 59 0 - 99 mg/dL Final         Passed - HDL in normal range and within 360 days    HDL  Date Value Ref Range Status  11/03/2020 40 >39 mg/dL Final         Passed - Patient is not pregnant      Passed - Valid encounter within last 12 months    Recent Outpatient Visits          1 month ago Hypercholesteremia   [facility] Vigg, Avanti, MD   2 months ago Need for influenza vaccination   [facility] Charlynne Cousins, MD  5 months ago Diabetes mellitus without complication [facility])   [facility] Vigg, Avanti, MD   1 year ago Annual physical exam    [facility] Eulogio Bear, NP   1 year ago [place], [place], [place]      Future Appointments            In 3 days Vigg, Avanti, MD [facility], [place]

## 2020-12-19 ENCOUNTER — Telehealth: Payer: BC Managed Care – PPO | Admitting: Internal Medicine

## 2020-12-20 ENCOUNTER — Ambulatory Visit
Admission: RE | Admit: 2020-12-20 | Discharge: 2020-12-20 | Disposition: A | Discharge status: Home / Self Care | Payer: BC Managed Care – PPO | Attending: Pulmonary Disease | Admitting: Pulmonary Disease

## 2020-12-20 ENCOUNTER — Ambulatory Visit
Admission: RE | Admit: 2020-12-20 | Discharge: 2020-12-20 | Disposition: A | Discharge status: Home / Self Care | Payer: BC Managed Care – PPO | Source: Ambulatory Visit | Attending: Pulmonary Disease | Admitting: Pulmonary Disease

## 2020-12-20 ENCOUNTER — Encounter: Payer: Self-pay | Admitting: Pulmonary Disease

## 2020-12-20 ENCOUNTER — Other Ambulatory Visit: Payer: Self-pay

## 2020-12-20 ENCOUNTER — Ambulatory Visit: Payer: BC Managed Care – PPO | Admitting: Pulmonary Disease

## 2020-12-20 VITALS — BP 126/78 | HR 88 | Temp 97.8°F | Ht 68.5 in | Wt 259.4 lb

## 2020-12-20 DIAGNOSIS — R0602 Shortness of breath: Secondary | ICD-10-CM | POA: Insufficient documentation

## 2020-12-20 DIAGNOSIS — R053 Chronic cough: Secondary | ICD-10-CM | POA: Diagnosis not present

## 2020-12-20 DIAGNOSIS — Z6838 Body mass index (BMI) 38.0-38.9, adult: Secondary | ICD-10-CM | POA: Diagnosis not present

## 2020-12-20 DIAGNOSIS — Z8669 Personal history of other diseases of the nervous system and sense organs: Secondary | ICD-10-CM

## 2020-12-20 NOTE — Patient Instructions (Signed)
You may stop using the inhaler with the green cap (Spiriva) since it is not helping you.  Use the albuterol (emergency inhaler) as needed for shortness of breath up to 4 times a day.  We are scheduling breathing tests and a chest x-ray.  We will see you in follow-up in 4 to 6 weeks time we will call you with the results of your tests as they become available.

## 2020-12-20 NOTE — Progress Notes (Signed)
Subjective:    Patient ID: Robert Griffith, male    DOB: 10/27/1955, 65 y.o.   MRN: [id number]  Chief Complaint  Patient presents with   pulmonary consult    Per Dr. Glenetta Hew with exertion, prod cough with white sputum and wheezing x4-5y    HPI Robert Griffith is a 65 year old lifelong never smoker with prior secondhand smoke exposure who presents for evaluation of shortness of breath with exertion, cough productive of white sputum and wheezing for approximately 4 to 5 years.  He is kindly referred by Dr. Loura Pardon.  Patient states that he has had significant weight gain over this period of time and has noted increasing shortness of breath over the last 4 to 5 years but worse over the last month.  He says that exertion exacerbates the shortness of breath but resting improves it.  Inhalers are usually of not much help.  He is currently on Spiriva and as needed albuterol.  Finds that Spiriva does not help much at all, occasionally albuterol will help some.  He notes that sometimes his symptoms are worse with the change of season if his nasal symptoms flare.  He does have issues with seasonal rhinitis.  He has cough productive of whitish sputum, never discolored, never any hemoptysis.  No chest pain, orthopnea or paroxysmal nocturnal dyspnea.  No lower extremity edema or calf tenderness.  He does have significant gastroesophageal reflux symptoms.  Tries to avoid trigger foods.  Does follow-up reflux measures.  He was previously diagnosed with obstructive sleep apnea and had UVPP and states that this "cured him".  He is very evasive about sleep symptoms and states he does not need a sleep study.  He is currently retired but worked previously as a Merchandiser, retail in [employer].  States that he used to get frequent spirometry's at the [employer] where he worked previously and never had an issue.    Does not endorse any other symptomatology.   Review of Systems A 10 point review of systems  was performed and it is as noted above otherwise negative.  Past Medical History:  Diagnosis Date   Diabetes mellitus without complication (HCC)    Hyperlipidemia    Hypertension    Kidney stone    Obesity    Restless legs    Wears dentures    Has full upper and lower, only wears upper   Past Surgical History:  Procedure Laterality Date   COLONOSCOPY N/A 09/12/2020   Procedure: COLONOSCOPY;  Surgeon: Midge Minium, MD;  [facility];  Service: Endoscopy;  Laterality: N/A;  Diabetic   left toe surgery     POLYPECTOMY N/A 09/12/2020   Procedure: POLYPECTOMY;  Surgeon: Midge Minium, MD;  Location[facility];  Service: Endoscopy;  Laterality: N/A;   sleep apnea surgery     TONSILLECTOMY AND ADENOIDECTOMY     Family History  Problem Relation Age of Onset   Heart attack Mother    Heart disease Mother    Heart disease Father    Stroke Brother    Coronary artery disease Other        4 family members   Social History   Tobacco Use   Smoking status: Never   Smokeless tobacco: Never  Substance Use Topics   Alcohol use: No   Allergies  Allergen Reactions   Other     Bananas: indigestion   Current Meds  Medication Sig   albuterol (VENTOLIN HFA) 108 (90 Base) MCG/ACT inhaler Inhale  2 puffs into the lungs every 6 (six) hours as needed for wheezing or shortness of breath.   aspirin 81 MG EC tablet Take 81 mg by mouth daily.     fexofenadine (ALLEGRA ALLERGY) 180 MG tablet Take 1 tablet (180 mg total) by mouth daily.   glucose blood test strip Use as instructed   losartan (COZAAR) 25 MG tablet Take 1 tablet (25 mg total) by mouth daily.   metFORMIN (GLUCOPHAGE) 500 MG tablet TAKE 2 TABLETS (1,000 MG TOTAL) BY MOUTH 2 (TWO) TIMES DAILY WITH A MEAL.   ONE TOUCH ULTRA TEST test strip USE TO TEST BLOOD SUGAR DAILY (EX E11.9)   rosuvastatin (CRESTOR) 40 MG tablet Take 1 tablet (40 mg total) by mouth daily.   Tiotropium Bromide Monohydrate (SPIRIVA RESPIMAT)  2.5 MCG/ACT AERS Inhale 2 puffs into the lungs daily.   Immunization History  Administered Date(s) Administered   Fluad Quad(high Dose 65+) 11/30/2019, 09/28/2020   Influenza,inj,Quad PF,6+ Mos 09/19/2016, 10/03/2018   Influenza-Unspecified 11/01/2014, 11/03/2015, 10/01/2017   Moderna Sars-Covid-2 Vaccination 02/23/2019, 03/24/2019, 01/15/2020   Pneumococcal Conjugate-13 05/20/2019   Pneumococcal-Unspecified 04/13/2010   Tdap 06/02/2010, 09/28/2020   Zoster Recombinat (Shingrix) 11/14/2018, 09/08/2019       Objective:   Physical Exam BP 126/78 (BP Location: Left Arm, Cuff Size: Large)   Pulse 88   Temp 97.8 F (36.6 C) (Temporal)   Ht 5' 8.5" (1.74 m)   Wt 259 lb 6.4 oz (117.7 kg)   SpO2 98%   BMI 38.87 kg/m   SpO2: 98 % O2 Device: None (Room air)  GENERAL: Obese gentleman, no acute distress, no conversational dyspnea  HEAD: Normocephalic, atraumatic.  EYES: Pupils equal, round, reactive to light.  No scleral icterus.  MOUTH: Nose/mouth/throat not examined due to institutional masking requirements. NECK: Supple. No thyromegaly. Trachea midline. No JVD.  No adenopathy. PULMONARY: Good air entry bilaterally.  Coarse, otherwise, no adventitious sounds. CARDIOVASCULAR: S1 and S2. Regular rate and rhythm.  No rubs, murmurs or gallops heard. ABDOMEN: Obese otherwise benign. MUSCULOSKELETAL: No joint deformity, no clubbing, no edema.  NEUROLOGIC: No overt focal deficit, no gait disturbance, speech is fluent. SKIN: Intact,warm,dry. PSYCH: Mood and behavior normal.     Assessment & Plan:     ICD-10-CM   1. SOB (shortness of breath)  R06.02 Pulmonary Function Test [facility] Only    DG Chest 2 View   Will obtain PFTs Discontinue Spiriva as it is ineffective Continue as needed albuterol    2. Chronic cough  R05.3    Suspect multifactorial: Upper airway cough syndrome, GERD/LPR ?  Asthma    3. BMI 38.0-38.9,adult  Z68.38    Issue adds complexity to his management Recommend  weight loss    4. History of sleep apnea  Z86.69    Status post UVPP Has gained significant weight since surgery Recommend repeat sleep study Patient declines for now     Orders Placed This Encounter  Procedures   DG Chest 2 View    Standing Status:   Future    Number of Occurrences:   1    Standing Expiration Date:   06/20/2021    Order Specific Question:   Reason for Exam (SYMPTOM  OR DIAGNOSIS REQUIRED)    Answer:   sob    Order Specific Question:   Preferred imaging location?    Answer:   [facility]   Pulmonary Function Test [facility] Only    Standing Status:   Future    Number of Occurrences:  1    Standing Expiration Date:   12/20/2021    Scheduling Instructions:     Next available.    Order Specific Question:   Full PFT: includes the following: basic spirometry, spirometry pre & post bronchodilator, diffusion capacity (DLCO), lung volumes    Answer:   Full PFT   Will see the patient in follow-up in 4 to 6 weeks time he is to call sooner should any new problems arise.   Gailen Shelter, MD Advanced [facility]choscopy [facility]    *This note was dictated using voice recognition software/Dragon.  Despite best efforts to proofread, errors can occur which can change the meaning. Any transcriptional errors that result from this process are unintentional and may not be fully corrected at the time of dictation.

## 2020-12-21 ENCOUNTER — Ambulatory Visit: Payer: BC Managed Care – PPO | Admitting: Internal Medicine

## 2020-12-28 ENCOUNTER — Encounter: Payer: Self-pay | Admitting: Internal Medicine

## 2020-12-30 ENCOUNTER — Ambulatory Visit: Payer: Self-pay

## 2020-12-30 ENCOUNTER — Encounter: Payer: Self-pay | Admitting: Nurse Practitioner

## 2020-12-30 ENCOUNTER — Telehealth (INDEPENDENT_AMBULATORY_CARE_PROVIDER_SITE_OTHER): Payer: BC Managed Care – PPO | Admitting: Nurse Practitioner

## 2020-12-30 VITALS — Temp 96.0°F

## 2020-12-30 DIAGNOSIS — U071 COVID-19: Secondary | ICD-10-CM | POA: Insufficient documentation

## 2020-12-30 MED ORDER — MOLNUPIRAVIR EUA 200MG CAPSULE: 4.0000 | ORAL_CAPSULE | Freq: Two times a day (BID) | ORAL | 0 refills | Status: AC

## 2020-12-30 NOTE — Telephone Encounter (Signed)
Noted  

## 2020-12-30 NOTE — Progress Notes (Signed)
Acute Office Visit  Subjective:    Patient ID: Robert Griffith, male    DOB: Mar 19, 1955, 65 y.o.   MRN: [id number]  Chief Complaint  Patient presents with   Covid Positive    Tested positive today, symptoms, headache, body aches, congestion, cough, chills,  Symptoms started on Tuesday. Wife tested positive.    HPI Patient is in today for headache, congestion, and body aches since Tuesday. Positive home covid-19 test today.   UPPER RESPIRATORY TRACT INFECTION  Worst symptom: fatigue Fever: no Cough: yes Shortness of breath: no Wheezing: no Chest pain: no Chest tightness: no Chest congestion: no Nasal congestion: yes Runny nose: yes Post nasal drip: yes Sneezing: yes Sore throat: yes Swollen glands: no Sinus pressure: no Headache: no Face pain: no Toothache: no Ear pain: no  Ear pressure: no  Eyes red/itching:no Eye drainage/crusting: no  Vomiting: no Rash: no Fatigue: yes Sick contacts: yes - wife Strep contacts: no  Context: worse Recurrent sinusitis: no Relief with OTC cold/cough medications: yes  Treatments attempted: dayquil, nyquil    Past Medical History:  Diagnosis Date   Diabetes mellitus without complication ([place])    Hyperlipidemia    Hypertension    Kidney stone    Obesity    Restless legs    Wears dentures    Has full upper and lower, only wears upper    Past Surgical History:  Procedure Laterality Date   COLONOSCOPY N/A 09/12/2020   Procedure: COLONOSCOPY;  Surgeon: Lucilla Lame, MD;  Location: [place];  Service: Endoscopy;  Laterality: N/A;  Diabetic   left toe surgery     POLYPECTOMY N/A 09/12/2020   Procedure: POLYPECTOMY;  Surgeon: Lucilla Lame, MD;  Location: [place];  Service: Endoscopy;  Laterality: N/A;   sleep apnea surgery     TONSILLECTOMY AND ADENOIDECTOMY      Family History  Problem Relation Age of Onset   Heart attack Mother    Heart disease Mother    Heart disease Father    Stroke  Brother    Coronary artery disease Other        4 family members    Social History   Socioeconomic History   Marital status: Married    Spouse name: Not on file   Number of children: Not on file   Years of education: Not on file   Highest education level: Not on file  Occupational History   Not on file  Tobacco Use   Smoking status: Never   Smokeless tobacco: Never  Vaping Use   Vaping Use: Never used  Substance and Sexual Activity   Alcohol use: No   Drug use: No   Sexual activity: Not on file  Other Topics Concern   Not on file  Social History Narrative   Not on file   Social Determinants of Health   Financial Resource Strain: Not on file  Food Insecurity: Not on file  Transportation Needs: Not on file  Physical Activity: Not on file  Stress: Not on file  Social Connections: Not on file  Intimate Partner Violence: Not on file    Outpatient Medications Prior to Visit  Medication Sig Dispense Refill   albuterol (VENTOLIN HFA) 108 (90 Base) MCG/ACT inhaler Inhale 2 puffs into the lungs every 6 (six) hours as needed for wheezing or shortness of breath. 8 g 4   aspirin 81 MG EC tablet Take 81 mg by mouth daily.       fexofenadine (ALLEGRA  ALLERGY) 180 MG tablet Take 1 tablet (180 mg total) by mouth daily. 30 tablet 1   glucose blood test strip Use as instructed 100 each 12   losartan (COZAAR) 25 MG tablet Take 1 tablet (25 mg total) by mouth daily. 30 tablet 3   metFORMIN (GLUCOPHAGE) 500 MG tablet TAKE 2 TABLETS (1,000 MG TOTAL) BY MOUTH 2 (TWO) TIMES DAILY WITH A MEAL. 360 tablet 1   ONE TOUCH ULTRA TEST test strip USE TO TEST BLOOD SUGAR DAILY (EX E11.9) 100 each 4   rosuvastatin (CRESTOR) 40 MG tablet Take 1 tablet (40 mg total) by mouth daily. 30 tablet 4   Tiotropium Bromide Monohydrate (SPIRIVA RESPIMAT) 2.5 MCG/ACT AERS Inhale 2 puffs into the lungs daily. 1 each 2   No facility-administered medications prior to visit.    Allergies  Allergen Reactions    Other     Bananas: indigestion    Review of Systems  Constitutional:  Positive for chills and fatigue. Negative for fever.  HENT:  Positive for congestion, postnasal drip, rhinorrhea, sneezing and sore throat. Negative for ear pain and sinus pressure.   Eyes: Negative.   Respiratory:  Positive for cough. Negative for shortness of breath.   Cardiovascular: Negative.   Gastrointestinal: Negative.   Endocrine: Negative.   Genitourinary: Negative.   Musculoskeletal:  Positive for myalgias.  Skin: Negative.   Neurological: Negative.   Psychiatric/Behavioral: Negative.        Objective:    Physical Exam Vitals and nursing note reviewed.  Constitutional:      General: He is not in acute distress.    Appearance: Normal appearance.  HENT:     Head: Normocephalic.  Eyes:     Conjunctiva/sclera: Conjunctivae normal.  Pulmonary:     Effort: Pulmonary effort is normal.     Comments: Able to talk in complete sentences Neurological:     Mental Status: He is alert and oriented to person, place, and time.  Psychiatric:        Mood and Affect: Mood normal.        Behavior: Behavior normal.        Thought Content: Thought content normal.        Judgment: Judgment normal.    Temp (!) 96 F (35.6 C) (Oral)  Wt Readings from Last 3 Encounters:  12/20/20 259 lb 6.4 oz (117.7 kg)  11/09/20 259 lb 9.6 oz (117.8 kg)  09/28/20 255 lb 6.4 oz (115.8 kg)    Health Maintenance Due  Topic Date Due   OPHTHALMOLOGY EXAM  10/13/2019   COVID-19 Vaccine (4 - Booster for Moderna series) 03/11/2020   Pneumonia Vaccine 59+ Years old (2 - PPSV23 if available, else PCV20) 05/19/2020   FOOT EXAM  11/29/2020    There are no preventive care reminders to display for this patient.   Lab Results  Component Value Date   TSH 2.940 11/03/2020   Lab Results  Component Value Date   WBC 7.1 11/03/2020   HGB 15.5 11/03/2020   HCT 46.7 11/03/2020   MCV 90 11/03/2020   PLT 176 11/03/2020   Lab  Results  Component Value Date   NA 136 11/03/2020   K 4.7 11/03/2020   CO2 21 11/03/2020   GLUCOSE 125 (H) 11/03/2020   BUN 14 11/03/2020   CREATININE 0.85 11/03/2020   BILITOT 0.6 11/03/2020   ALKPHOS 83 11/03/2020   AST 30 11/03/2020   ALT 34 11/03/2020   PROT 7.3 11/03/2020   ALBUMIN 4.7  11/03/2020   CALCIUM 9.5 11/03/2020   ANIONGAP 6 01/19/2016   EGFR 96 11/03/2020   Lab Results  Component Value Date   CHOL 138 11/03/2020   Lab Results  Component Value Date   HDL 40 11/03/2020   Lab Results  Component Value Date   LDLCALC 59 11/03/2020   Lab Results  Component Value Date   TRIG 247 (H) 11/03/2020   Lab Results  Component Value Date   CHOLHDL 3.5 11/03/2020   Lab Results  Component Value Date   HGBA1C 6.3 (H) 11/03/2020       Assessment & Plan:   Problem List Items Addressed This Visit       Other   COVID-19 - Primary    Positive home covid-19 test on Tuesday. Will treat with molnupiravir. Encourage rest, fluids. Can continue nyquil/dayquil. Work note given. Discussed isolation. Follow up if symptoms worsen or don't improve.      Relevant Medications   molnupiravir EUA (LAGEVRIO) 200 mg CAPS capsule     Meds ordered this encounter  Medications   molnupiravir EUA (LAGEVRIO) 200 mg CAPS capsule    Sig: Take 4 capsules (800 mg total) by mouth 2 (two) times daily for 5 days.    Dispense:  40 capsule    Refill:  0    This visit was completed via MyChart due to the restrictions of the COVID-19 pandemic. All issues as above were discussed and addressed. Physical exam was done as above through visual confirmation on MyChart. If it was felt that the patient should be evaluated in the office, they were directed there. The patient verbally consented to this visit. Location of the patient: home Location of the provider: work Those involved with this call:  Provider: Vance Peper, NP CMA: Frazier Butt, [place] Desk/Registration: Myrlene Broker   Time spent on call:  10 minutes with patient face to face via video conference. More than 50% of this time was spent in counseling and coordination of care. 10 minutes total spent in review of patient's record and preparation of their chart.   Charyl Dancer, NP

## 2020-12-30 NOTE — Assessment & Plan Note (Signed)
Positive home covid-19 test on Tuesday. Will treat with molnupiravir. Encourage rest, fluids. Can continue nyquil/dayquil. Work note given. Discussed isolation. Follow up if symptoms worsen or don't improve.

## 2020-12-30 NOTE — Telephone Encounter (Signed)
The patient tested positive for COVID 19 via an at home test   The patient is currently experiencing a sore throat, congestion, headache   The patient would like to be prescribed something to help with their discomfort   Please contact further when available    Chief Complaint: COVID positive Symptoms: Cough, congestion,headache,sore throat Frequency: Started Monday Pertinent Negatives: Patient denies fever Disposition: [] ED /[] Urgent Care (no appt availability in office) / [x] Appointment(In office/virtual)/ []  [facility]/ [] Home Care/ [] Refused Recommended Disposition /[] [facility]/ []  Follow-up with PCP Additional Notes: OK for virtual per [place] in practice.  Reason for Disposition  MILD difficulty breathing (e.g., minimal/no SOB at rest, SOB with walking, pulse <100)  Answer Assessment - Initial Assessment Questions 1. COVID-19 DIAGNOSIS: "Who made your COVID-19 diagnosis?" "Was it confirmed by a positive lab test or self-test?" If not diagnosed by a doctor (or NP/PA), ask "Are there lots of cases (community spread) where you live?" Note: See public health department website, if unsure.     Home test 2. COVID-19 EXPOSURE: "Was there any known exposure to COVID before the symptoms began?" CDC Definition of close contact: within 6 feet (2 meters) for a total of 15 minutes or more over a 24-hour period.      No 3. ONSET: "When did the COVID-19 symptoms start?"      Tuesday 4. WORST SYMPTOM: "What is your worst symptom?" (e.g., cough, fever, shortness of breath, muscle aches)     Congestion 5. COUGH: "Do you have a cough?" If Yes, ask: "How bad is the cough?"       Yes 6. FEVER: "Do you have a fever?" If Yes, ask: "What is your temperature, how was it measured, and when did it start?"     Chills 7. RESPIRATORY STATUS: "Describe your breathing?" (e.g., shortness of breath, wheezing, unable to speak)      No 8. BETTER-SAME-WORSE: "Are you getting better,  staying the same or getting worse compared to yesterday?"  If getting worse, ask, "In what way?"     Worse 9. HIGH RISK DISEASE: "Do you have any chronic medical problems?" (e.g., asthma, heart or lung disease, weak immune system, obesity, etc.)     HTN 10. VACCINE: "Have you had the COVID-19 vaccine?" If Yes, ask: "Which one, how many shots, when did you get it?"       Yes 11. BOOSTER: "Have you received your COVID-19 booster?" If Yes, ask: "Which one and when did you get it?"       Yes 12. PREGNANCY: "Is there any chance you are pregnant?" "When was your last menstrual period?"       N/a 13. OTHER SYMPTOMS: "Do you have any other symptoms?"  (e.g., chills, fatigue, headache, loss of smell or taste, muscle pain, sore throat)       Headache 14. O2 SATURATION MONITOR:  "Do you use an oxygen saturation monitor (pulse oximeter) at home?" If Yes, ask "What is your reading (oxygen level) today?" "What is your usual oxygen saturation reading?" (e.g., 95%)       No  Protocols used: Coronavirus (COVID-19) Diagnosed or Suspected-A-AH

## 2021-01-03 ENCOUNTER — Telehealth: Payer: BC Managed Care – PPO | Admitting: Internal Medicine

## 2021-01-11 ENCOUNTER — Encounter: Payer: Self-pay | Admitting: Internal Medicine

## 2021-01-11 ENCOUNTER — Other Ambulatory Visit: Payer: Self-pay

## 2021-01-11 ENCOUNTER — Ambulatory Visit: Payer: BC Managed Care – PPO | Admitting: Internal Medicine

## 2021-01-11 VITALS — BP 137/83 | HR 76 | Temp 98.6°F | Ht 68.5 in | Wt 257.2 lb

## 2021-01-11 DIAGNOSIS — E119 Type 2 diabetes mellitus without complications: Secondary | ICD-10-CM | POA: Diagnosis not present

## 2021-01-11 DIAGNOSIS — E785 Hyperlipidemia, unspecified: Secondary | ICD-10-CM | POA: Diagnosis not present

## 2021-01-11 DIAGNOSIS — U071 COVID-19: Secondary | ICD-10-CM

## 2021-01-11 LAB — BAYER DCA HB A1C WAIVED: HB A1C (BAYER DCA - WAIVED): 6.7 % — ABNORMAL HIGH (ref 4.8–5.6)

## 2021-01-11 MED ORDER — FEXOFENADINE HCL 180 MG PO TABS: 180.0000 mg | ORAL_TABLET | Freq: Every day | ORAL | 1 refills | Status: DC

## 2021-01-11 MED ORDER — BENZONATATE 100 MG PO CAPS: 100.0000 mg | ORAL_CAPSULE | Freq: Two times a day (BID) | ORAL | 0 refills | Status: DC | PRN

## 2021-01-11 NOTE — Progress Notes (Signed)
BP 137/83    Pulse 76    Temp 98.6 F (37 C) (Oral)    Ht 5' 8.5" (1.74 m)    Wt 257 lb 3.2 oz (116.7 kg)    SpO2 95%    BMI 38.53 kg/m    Subjective:    Patient ID: Robert Griffith, male    DOB: 04/01/1955, 66 y.o.   MRN: [id number]  Chief Complaint  Patient presents with   Hyperlipidemia    HPI: Robert Griffith is a 66 y.o. male  Had COVID dec 26th  Feels better.   Hyperlipidemia This is a chronic problem. Pertinent negatives include no chest pain, myalgias or shortness of breath.  Cough This is a new (covid dec 26th) problem. The current episode started 1 to 4 weeks ago. The problem has been waxing and waning. The cough is Non-productive. Pertinent negatives include no chest pain, chills, ear congestion, ear pain, fever, headaches, heartburn, hemoptysis, myalgias, nasal congestion, postnasal drip, rhinorrhea, sore throat, shortness of breath, sweats, weight loss or wheezing. Associated symptoms comments: Clear phlgem .   Chief Complaint  Patient presents with   Hyperlipidemia    Relevant past medical, surgical, family and social history reviewed and updated as indicated. Interim medical history since our last visit reviewed. Allergies and medications reviewed and updated.  Review of Systems  Constitutional:  Negative for chills, fever and weight loss.  HENT:  Negative for ear pain, postnasal drip, rhinorrhea and sore throat.   Respiratory:  Positive for cough. Negative for hemoptysis, shortness of breath and wheezing.   Cardiovascular:  Negative for chest pain.  Gastrointestinal:  Negative for heartburn.  Musculoskeletal:  Negative for myalgias.  Neurological:  Negative for headaches.   Per HPI unless specifically indicated above     Objective:    BP 137/83    Pulse 76    Temp 98.6 F (37 C) (Oral)    Ht 5' 8.5" (1.74 m)    Wt 257 lb 3.2 oz (116.7 kg)    SpO2 95%    BMI 38.53 kg/m   Wt Readings from Last 3 Encounters:  01/11/21 257 lb 3.2 oz (116.7 kg)   12/20/20 259 lb 6.4 oz (117.7 kg)  11/09/20 259 lb 9.6 oz (117.8 kg)    Physical Exam Vitals and nursing note reviewed.  Constitutional:      General: He is not in acute distress.    Appearance: Normal appearance. He is not ill-appearing or diaphoretic.  HENT:     Head: Normocephalic and atraumatic.     Right Ear: Tympanic membrane and external ear normal. There is no impacted cerumen.     Left Ear: External ear normal.     Nose: No congestion or rhinorrhea.     Mouth/Throat:     Pharynx: No oropharyngeal exudate or posterior oropharyngeal erythema.  Eyes:     Conjunctiva/sclera: Conjunctivae normal.     Pupils: Pupils are equal, round, and reactive to light.  Cardiovascular:     Rate and Rhythm: Normal rate and regular rhythm.     Heart sounds: No murmur heard.   No friction rub. No gallop.  Pulmonary:     Effort: No respiratory distress.     Breath sounds: No stridor. No wheezing or rhonchi.  Chest:     Chest wall: No tenderness.  Abdominal:     General: Abdomen is flat. Bowel sounds are normal.     Palpations: Abdomen is soft. There is no mass.  Tenderness: There is no abdominal tenderness.  Musculoskeletal:     Cervical back: Normal range of motion and neck supple. No rigidity or tenderness.     Left lower leg: No edema.  Skin:    General: Skin is warm and dry.  Neurological:     Mental Status: He is alert.    Results for orders placed or performed in visit on 01/11/21  Bayer DCA Hb A1c Waived (STAT)  Result Value Ref Range   HB A1C (BAYER DCA - WAIVED) 6.7 (H) 4.8 - 5.6 %        Current Outpatient Medications:    albuterol (VENTOLIN HFA) 108 (90 Base) MCG/ACT inhaler, Inhale 2 puffs into the lungs every 6 (six) hours as needed for wheezing or shortness of breath., Disp: 8 g, Rfl: 4   aspirin 81 MG EC tablet, Take 81 mg by mouth daily.  , Disp: , Rfl:    benzonatate (TESSALON) 100 MG capsule, Take 1 capsule (100 mg total) by mouth 2 (two) times daily as  needed for cough., Disp: 20 capsule, Rfl: 0   fexofenadine (ALLEGRA ALLERGY) 180 MG tablet, Take 1 tablet (180 mg total) by mouth daily., Disp: 30 tablet, Rfl: 1   fexofenadine (ALLEGRA ALLERGY) 180 MG tablet, Take 1 tablet (180 mg total) by mouth daily., Disp: 10 tablet, Rfl: 1   glucose blood test strip, Use as instructed, Disp: 100 each, Rfl: 12   losartan (COZAAR) 25 MG tablet, Take 1 tablet (25 mg total) by mouth daily., Disp: 30 tablet, Rfl: 3   metFORMIN (GLUCOPHAGE) 500 MG tablet, TAKE 2 TABLETS (1,000 MG TOTAL) BY MOUTH 2 (TWO) TIMES DAILY WITH A MEAL., Disp: 360 tablet, Rfl: 1   ONE TOUCH ULTRA TEST test strip, USE TO TEST BLOOD SUGAR DAILY (EX E11.9), Disp: 100 each, Rfl: 4   rosuvastatin (CRESTOR) 40 MG tablet, Take 1 tablet (40 mg total) by mouth daily., Disp: 30 tablet, Rfl: 4   Tiotropium Bromide Monohydrate (SPIRIVA RESPIMAT) 2.5 MCG/ACT AERS, Inhale 2 puffs into the lungs daily., Disp: 1 each, Rfl: 2    Assessment & Plan:  URI: wills tart pt on allegra/ tessalon pealrs. pt advised to take Tylenol q 4- 6 hourly as needed. pt to take allegra q pm as needed and to call office if symptoms worsened pt verbalised understanding of such.     Problem List Items Addressed This Visit       Endocrine   Diabetes mellitus without complication ([place])   Relevant Orders   Bayer DCA Hb A1c Waived (STAT) (Completed)     Other   COVID-19   Hyperlipidemia - Primary   Relevant Orders   CBC with Differential/Platelet (Completed)   Comprehensive metabolic panel (Completed)   Lipid panel (Completed)   Urinalysis, Routine w reflex microscopic (Completed)   TSH (Completed)     Orders Placed This Encounter  Procedures   CBC with Differential/Platelet   Comprehensive metabolic panel   Lipid panel   Urinalysis, Routine w reflex microscopic   TSH   Bayer DCA Hb A1c Waived (STAT)     Meds ordered this encounter  Medications   fexofenadine (ALLEGRA ALLERGY) 180 MG tablet    Sig: Take 1  tablet (180 mg total) by mouth daily.    Dispense:  10 tablet    Refill:  1   benzonatate (TESSALON) 100 MG capsule    Sig: Take 1 capsule (100 mg total) by mouth 2 (two) times daily as needed for cough.  Dispense:  20 capsule    Refill:  0      H

## 2021-01-16 ENCOUNTER — Telehealth: Payer: Self-pay

## 2021-01-16 NOTE — Telephone Encounter (Signed)
Pt aware of upcoming covid test nothing further needed.

## 2021-01-19 ENCOUNTER — Other Ambulatory Visit
Admission: RE | Admit: 2021-01-19 | Discharge: 2021-01-19 | Disposition: A | Discharge status: Home / Self Care | Payer: BC Managed Care – PPO | Source: Ambulatory Visit | Attending: Pulmonary Disease | Admitting: Pulmonary Disease

## 2021-01-19 ENCOUNTER — Other Ambulatory Visit: Payer: Self-pay

## 2021-01-19 DIAGNOSIS — Z20822 Contact with and (suspected) exposure to covid-19: Secondary | ICD-10-CM

## 2021-01-19 DIAGNOSIS — Z01812 Encounter for preprocedural laboratory examination: Secondary | ICD-10-CM | POA: Diagnosis not present

## 2021-01-19 LAB — SARS CORONAVIRUS 2 (TAT 6-24 HRS): SARS Coronavirus 2: NEGATIVE

## 2021-01-20 ENCOUNTER — Other Ambulatory Visit: Payer: BC Managed Care – PPO

## 2021-01-20 ENCOUNTER — Ambulatory Visit: Payer: BC Managed Care – PPO | Attending: Pulmonary Disease

## 2021-01-20 DIAGNOSIS — R0602 Shortness of breath: Secondary | ICD-10-CM | POA: Diagnosis present

## 2021-01-20 DIAGNOSIS — E785 Hyperlipidemia, unspecified: Secondary | ICD-10-CM

## 2021-01-20 LAB — URINALYSIS, ROUTINE W REFLEX MICROSCOPIC
Bilirubin, UA: NEGATIVE
Glucose, UA: NEGATIVE
Ketones, UA: NEGATIVE
Leukocytes,UA: NEGATIVE
Nitrite, UA: NEGATIVE
Protein,UA: NEGATIVE
RBC, UA: NEGATIVE
Specific Gravity, UA: 1.015 (ref 1.005–1.030)
Urobilinogen, Ur: 0.2 mg/dL (ref 0.2–1.0)
pH, UA: 7.5 (ref 5.0–7.5)

## 2021-01-20 MED ORDER — ALBUTEROL SULFATE (2.5 MG/3ML) 0.083% IN NEBU
2.5000 mg | INHALATION_SOLUTION | Freq: Once | RESPIRATORY_TRACT | Status: AC
  Administered 2021-01-20: 2.5 mg via RESPIRATORY_TRACT
  Filled 2021-01-20: qty 3

## 2021-01-21 LAB — CBC WITH DIFFERENTIAL/PLATELET
Basophils Absolute: 0.1 10*3/uL (ref 0.0–0.2)
Basos: 1 %
EOS (ABSOLUTE): 0.3 10*3/uL (ref 0.0–0.4)
Eos: 5 %
Hematocrit: 43.2 % (ref 37.5–51.0)
Hemoglobin: 14.8 g/dL (ref 13.0–17.7)
Immature Grans (Abs): 0 10*3/uL (ref 0.0–0.1)
Immature Granulocytes: 0 %
Lymphocytes Absolute: 2.1 10*3/uL (ref 0.7–3.1)
Lymphs: 33 %
MCH: 30 pg (ref 26.6–33.0)
MCHC: 34.3 g/dL (ref 31.5–35.7)
MCV: 88 fL (ref 79–97)
Monocytes Absolute: 0.5 10*3/uL (ref 0.1–0.9)
Monocytes: 8 %
Neutrophils Absolute: 3.4 10*3/uL (ref 1.4–7.0)
Neutrophils: 53 %
Platelets: 177 10*3/uL (ref 150–450)
RBC: 4.93 x10E6/uL (ref 4.14–5.80)
RDW: 13.4 % (ref 11.6–15.4)
WBC: 6.4 10*3/uL (ref 3.4–10.8)

## 2021-01-21 LAB — COMPREHENSIVE METABOLIC PANEL
ALT: 30 IU/L (ref 0–44)
AST: 29 IU/L (ref 0–40)
Albumin/Globulin Ratio: 1.7 (ref 1.2–2.2)
Albumin: 4.3 g/dL (ref 3.8–4.8)
Alkaline Phosphatase: 70 IU/L (ref 44–121)
BUN/Creatinine Ratio: 16 (ref 10–24)
BUN: 13 mg/dL (ref 8–27)
Bilirubin Total: 0.5 mg/dL (ref 0.0–1.2)
CO2: 25 mmol/L (ref 20–29)
Calcium: 9.1 mg/dL (ref 8.6–10.2)
Chloride: 101 mmol/L (ref 96–106)
Creatinine, Ser: 0.83 mg/dL (ref 0.76–1.27)
Globulin, Total: 2.6 g/dL (ref 1.5–4.5)
Glucose: 121 mg/dL — ABNORMAL HIGH (ref 70–99)
Potassium: 4.7 mmol/L (ref 3.5–5.2)
Sodium: 137 mmol/L (ref 134–144)
Total Protein: 6.9 g/dL (ref 6.0–8.5)
eGFR: 96 mL/min/{1.73_m2} (ref >59)

## 2021-01-21 LAB — LIPID PANEL
Chol/HDL Ratio: 2.9 ratio (ref 0.0–5.0)
Cholesterol, Total: 124 mg/dL (ref 100–199)
HDL: 43 mg/dL (ref >39)
LDL Chol Calc (NIH): 53 mg/dL (ref 0–99)
Triglycerides: 170 mg/dL — ABNORMAL HIGH (ref 0–149)
VLDL Cholesterol Cal: 28 mg/dL (ref 5–40)

## 2021-01-21 LAB — TSH: TSH: 2.78 u[IU]/mL (ref 0.450–4.500)

## 2021-01-23 LAB — PULMONARY FUNCTION TEST ARMC ONLY
DL/VA % pred: 103 %
DL/VA: 4.3 ml/min/mmHg/L
DLCO unc % pred: 94 %
DLCO unc: 23.44 ml/min/mmHg
FEF 25-75 Post: 2.19 L/sec
FEF 25-75 Pre: 2.02 L/sec
FEF2575-%Change-Post: 8 %
FEF2575-%Pred-Post: 89 %
FEF2575-%Pred-Pre: 82 %
FEV1-%Change-Post: 2 %
FEV1-%Pred-Post: 83 %
FEV1-%Pred-Pre: 81 %
FEV1-Post: 2.61 L
FEV1-Pre: 2.54 L
FEV1FVC-%Change-Post: 2 %
FEV1FVC-%Pred-Pre: 103 %
FEV6-%Change-Post: 1 %
FEV6-%Pred-Post: 83 %
FEV6-%Pred-Pre: 83 %
FEV6-Post: 3.33 L
FEV6-Pre: 3.3 L
FEV6FVC-%Change-Post: 0 %
FEV6FVC-%Pred-Post: 105 %
FEV6FVC-%Pred-Pre: 105 %
FVC-%Change-Post: 0 %
FVC-%Pred-Post: 79 %
FVC-%Pred-Pre: 78 %
FVC-Post: 3.33 L
FVC-Pre: 3.31 L
Post FEV1/FVC ratio: 78 %
Post FEV6/FVC ratio: 100 %
Pre FEV1/FVC ratio: 77 %
Pre FEV6/FVC Ratio: 100 %
RV % pred: 95 %
RV: 2.19 L
TLC % pred: 98 %
TLC: 6.51 L

## 2021-01-24 ENCOUNTER — Encounter: Payer: Self-pay | Admitting: Pulmonary Disease

## 2021-01-24 ENCOUNTER — Other Ambulatory Visit: Payer: Self-pay

## 2021-01-24 ENCOUNTER — Ambulatory Visit: Payer: BC Managed Care – PPO | Admitting: Pulmonary Disease

## 2021-01-24 VITALS — BP 130/80 | HR 71 | Temp 97.8°F | Ht 68.0 in | Wt 258.6 lb

## 2021-01-24 DIAGNOSIS — R053 Chronic cough: Secondary | ICD-10-CM

## 2021-01-24 DIAGNOSIS — R0602 Shortness of breath: Secondary | ICD-10-CM

## 2021-01-24 DIAGNOSIS — R0683 Snoring: Secondary | ICD-10-CM

## 2021-01-24 MED ORDER — MONTELUKAST SODIUM 10 MG PO TABS: 10.0000 mg | ORAL_TABLET | Freq: Every day | ORAL | 2 refills | Status: DC

## 2021-01-24 NOTE — Progress Notes (Signed)
Subjective:    Patient ID: Robert Griffith, male    DOB: [birth year], 66 y.o.   MRN: [id number] Patient Care Team: Dorcas Carrow, DO as PCP - General (Family Medicine) Antonieta Iba, MD as Consulting Physician (Cardiology)  Chief Complaint  Patient presents with   Follow-up   HPI Robert Griffith is a 66 year old lifelong never smoker with prior secondhand smoke exposure who presents for follow-up of shortness of breath with exertion, cough productive of white sputum and wheezing for approximately 4 to 5 years.  We initially evaluated him on 20 December 2020, at that time he had a chest x-ray performed which did not show significant acute disease.  The patient states that he has had significant weight gain over this period of time and has noted increasing shortness of breath over the last 4 to 5 years but worse over the last month prior to his initial evaluation in December.  PFTs were performed on 20 January 2021 and the only salient finding is very reduced ERV consistent with obesity.  Patient states that exertion exacerbates the shortness of breath but resting improves it.  Inhalers are usually of not much help.  Previously he was on Spiriva and as needed albuterol.  Spiriva was discontinued due to ineffectiveness.  He is on as needed albuterol.  He notes that sometimes his symptoms are worse with the change of season if his nasal symptoms flare.  He does have issues with seasonal rhinitis.  He has cough productive of whitish sputum, never discolored, never any hemoptysis.  No chest pain, orthopnea or paroxysmal nocturnal dyspnea.  No lower extremity edema or calf tenderness.  He does have significant gastroesophageal reflux symptoms.  Tries to avoid trigger foods.  Does follow-up reflux measures.   He was previously diagnosed with obstructive sleep apnea and had UVPP and states that this "cured him".  He is very evasive about sleep symptoms and states he does not need a sleep study.   However, Epworth scale measured today was 13.   Does not endorse any other symptomatology.  DATA 12/20/2020 chest x-ray PA and lateral: No acute disease. 01/20/2021 PFTs: FEV1 2.54 L or 81% predicted, FVC 3.31 L or 78% predicted, no bronchodilator response.  Lung volumes normal with exception of ERV that is 2% consistent with obesity.  Diffusion capacity normal  Review of Systems A 10 point review of systems was performed and it is as noted above otherwise negative.  Patient Active Problem List   Diagnosis Date Noted   Chronic cough 11/09/2020   Onychomycosis 09/28/2020   Polyp of ascending colon    Morbid obesity (HCC) 03/05/2018   Bursitis of left shoulder 06/24/2017   Rupture of tendon of biceps, long head 06/24/2017   Hypercholesteremia 02/21/2017   BMI 38.0-38.9,adult 08/02/2015   Restless legs 02/01/2015   Social History   Tobacco Use   Smoking status: Never   Smokeless tobacco: Never  Substance Use Topics   Alcohol use: No   Current Meds  Medication Sig   aspirin 81 MG EC tablet Take 81 mg by mouth daily.     glucose blood test strip Use as instructed   ONE TOUCH ULTRA TEST test strip USE TO TEST BLOOD SUGAR DAILY (EX E11.9)   albuterol (VENTOLIN HFA) 108 (90 Base) MCG/ACT inhaler Inhale 2 puffs into the lungs every 6 (six) hours as needed for wheezing or shortness of breath. (Patient not taking: Reported on 08/01/2021)   [DISCONTINUED] losartan (COZAAR) 25 MG tablet  Take 1 tablet (25 mg total) by mouth daily.   metFORMIN (GLUCOPHAGE) 500 MG tablet TAKE 2 TABLETS (1,000 MG TOTAL) BY MOUTH 2 (TWO) TIMES DAILY WITH A MEAL.   rosuvastatin (CRESTOR) 40 MG tablet Take 1 tablet (40 mg total) by mouth daily.   Immunization History  Administered Date(s) Administered   Fluad Quad(high Dose 65+) 11/30/2019, 09/28/2020,    Influenza,inj,Quad PF,6+ Mos 09/19/2016, 10/03/2018   Influenza-Unspecified 11/01/2014, 11/03/2015, 10/01/2017   Moderna Sars-Covid-2 Vaccination  02/23/2019, 03/24/2019, 01/15/2020   Pneumococcal Conjugate-13 05/20/2019   Pneumococcal Polysaccharide-23 09/07/2021   Pneumococcal-Unspecified 04/13/2010   Tdap 06/02/2010, 09/28/2020   Zoster Recombinant(Shingrix) 11/14/2018, 09/08/2019      Objective:   Physical Exam BP 130/80 (BP Location: Left Arm, Patient Position: Sitting, Cuff Size: Normal)   Pulse 71   Temp 97.8 F (36.6 C) (Oral)   Ht 5\' 8"  (1.727 m)   Wt 258 lb 9.6 oz (117.3 kg)   SpO2 98%   BMI 39.32 kg/m   SpO2: 98 % O2 Device: None (Room air)  GENERAL: Obese gentleman, no acute distress, no conversational dyspnea  HEAD: Normocephalic, atraumatic.  EYES: Pupils equal, round, reactive to light.  No scleral icterus.  MOUTH: Nose/mouth/throat not examined due to institutional masking requirements. NECK: Supple. No thyromegaly. Trachea midline. No JVD.  No adenopathy. PULMONARY: Good air entry bilaterally.  Coarse, otherwise, no adventitious sounds. CARDIOVASCULAR: S1 and S2. Regular rate and rhythm.  No rubs, murmurs or gallops heard. ABDOMEN: Obese otherwise benign. MUSCULOSKELETAL: No joint deformity, no clubbing, no edema.  NEUROLOGIC: No overt focal deficit, no gait disturbance, speech is fluent. SKIN: Intact,warm,dry. PSYCH: Mood and behavior normal.     01/24/2021    4:00 PM  Results of the Epworth flowsheet  Sitting and reading 3  Watching TV 3  Sitting, inactive in a public place (e.g. a theatre or a meeting) 0  As a passenger in a car for an hour without a break 2  Lying down to rest in the afternoon when circumstances permit 2  Sitting and talking to someone 1  Sitting quietly after a lunch without alcohol 2  In a car, while stopped for a few minutes in traffic 0  Total score 13      Assessment & Plan:     ICD-10-CM   1. SOB (shortness of breath)  R06.02    By PFTs issues mostly related to obesity Recommend weight loss    2. Chronic cough  R05.3    Significant postnasal drip Trial of  montelukast    3. Loud snoring  R06.83 Home sleep test   At risk for sleep apnea Epworth scale 13 Home sleep study     Meds ordered this encounter  Medications   montelukast (SINGULAIR) 10 MG tablet    Sig: Take 1 tablet (10 mg total) by mouth at bedtime.    Dispense:  30 tablet    Refill:  2   Recommend sleep study.  Will give patient a trial of montelukast for control of chronic rhinitis symptoms and postnasal drip.  Will see the patient in follow-up in 2 months time he is to contact us prior to that time should any new difficulties arise.   Gailen Shelter, MD [facility]  Pulmonary-    *This note was dictated using voice recognition software/Dragon.  Despite best efforts to proofread, errors can occur which can change the meaning. Any transcriptional errors that result from this process are unintentional and may not be  fully corrected at the time of dictation.

## 2021-01-24 NOTE — Patient Instructions (Addendum)
Your breathing test show that the majority of the issue is related to weight.  Weight loss will help you in breathing easier.  We are going to order a home sleep study.  We are being a trial of medication called Singulair (montelukast) one tablet daily.  This should help her sinuses and also should help your cough.   We will see him in follow-up in 2 months time call sooner should any new problems arise.

## 2021-01-31 ENCOUNTER — Encounter: Payer: Self-pay | Admitting: Internal Medicine

## 2021-02-04 ENCOUNTER — Other Ambulatory Visit: Payer: Self-pay | Admitting: Internal Medicine

## 2021-02-04 DIAGNOSIS — E7800 Pure hypercholesterolemia, unspecified: Secondary | ICD-10-CM

## 2021-02-06 ENCOUNTER — Other Ambulatory Visit: Payer: Self-pay | Admitting: Internal Medicine

## 2021-02-06 DIAGNOSIS — E119 Type 2 diabetes mellitus without complications: Secondary | ICD-10-CM

## 2021-02-06 MED ORDER — CHERATUSSIN AC 100-10 MG/5ML PO SOLN: 5.0000 mL | Freq: Three times a day (TID) | ORAL | 0 refills | Status: DC | PRN

## 2021-02-06 MED ORDER — METFORMIN HCL 500 MG PO TABS: 1000.0000 mg | ORAL_TABLET | Freq: Two times a day (BID) | ORAL | 1 refills | Status: DC

## 2021-02-06 NOTE — Telephone Encounter (Signed)
Requested Prescriptions  Pending Prescriptions Disp Refills   metFORMIN (GLUCOPHAGE) 500 MG tablet 360 tablet 1    Sig: Take 2 tablets (1,000 mg total) by mouth 2 (two) times daily with a meal.     Endocrinology:  Diabetes - Biguanides Failed - 02/06/2021  5:50 PM      Failed - B12 Level in normal range and within 720 days    No results found for: VITAMINB12       Passed - Cr in normal range and within 360 days    Creatinine  Date Value Ref Range Status  09/18/2012 0.78 0.60 - 1.30 mg/dL Final   Creatinine, Ser  Date Value Ref Range Status  01/20/2021 0.83 0.76 - 1.27 mg/dL Final         Passed - HBA1C is between 0 and 7.9 and within 180 days    Hemoglobin A1C  Date Value Ref Range Status  09/16/2012 5.6 4.2 - 6.3 % Final    Comment:    The American Diabetes Association recommends that a primary goal of therapy should be <7% and that physicians should reevaluate the treatment regimen in patients with HbA1c values consistently >8%.    HB A1C (BAYER DCA - WAIVED)  Date Value Ref Range Status  01/11/2021 6.7 (H) 4.8 - 5.6 % Final    Comment:             Prediabetes: 5.7 - 6.4          Diabetes: >6.4          Glycemic control for adults with diabetes: <7.0          Passed - eGFR in normal range and within 360 days    EGFR (African American)  Date Value Ref Range Status  09/18/2012 >60  Final   GFR calc Af Amer  Date Value Ref Range Status  11/30/2019 107 >59 mL/min/1.73 Final    Comment:    **In accordance with recommendations from the NKF-ASN Task force,**   [facility] is in the process of updating its eGFR calculation to the   2021 CKD-EPI creatinine equation that estimates kidney function   without a race variable.    EGFR (Non-African Amer.)  Date Value Ref Range Status  09/18/2012 >60  Final    Comment:    eGFR values <51m/min/1.73 m2 may be an indication of chronic kidney disease (CKD). Calculated eGFR is useful in patients with stable renal function. The  eGFR calculation will not be reliable in acutely ill patients when serum creatinine is changing rapidly. It is not useful in  patients on dialysis. The eGFR calculation may not be applicable to patients at the low and high extremes of body sizes, pregnant women, and vegetarians.    GFR calc non Af Amer  Date Value Ref Range Status  11/30/2019 93 >59 mL/min/1.73 Final   eGFR  Date Value Ref Range Status  01/20/2021 96 >59 mL/min/1.73 Final         Passed - Valid encounter within last 6 months    Recent Outpatient Visits          3 weeks ago Hyperlipidemia, unspecified hyperlipidemia type   [facility] Vigg, Avanti, MD   [place]   2 months [place]   4 months ago Need for influenza vaccination   [place]   6 months  ago Diabetes mellitus without complication ([place])   [place], MD      Future Appointments            In 2 months Vigg, Avanti, MD [facility], [facility]           Passed - CBC within normal limits and completed in the last 12 months    WBC  Date Value Ref Range Status  01/20/2021 6.4 3.4 - 10.8 x10E3/uL Final  01/19/2016 6.7 3.8 - 10.6 K/uL Final   RBC  Date Value Ref Range Status  01/20/2021 4.93 4.14 - 5.80 x10E6/uL Final  01/19/2016 4.96 4.40 - 5.90 MIL/uL Final   Hemoglobin  Date Value Ref Range Status  01/20/2021 14.8 13.0 - 17.7 g/dL Final   Hematocrit  Date Value Ref Range Status  01/20/2021 43.2 37.5 - 51.0 % Final   MCHC  Date Value Ref Range Status  01/20/2021 34.3 31.5 - 35.7 g/dL Final  01/19/2016 34.2 32.0 - 36.0 g/dL Final   [facility]  Date Value Ref Range Status  01/20/2021 30.0 26.6 - 33.0 pg Final  01/19/2016 28.9 26.0 - 34.0 pg Final   MCV  Date Value Ref Range Status  01/20/2021 88 79 - 97 fL Final  09/17/2012 89 80 - 100 fL  Final   No results found for: PLTCOUNTKUC, LABPLAT, POCPLA RDW  Date Value Ref Range Status  01/20/2021 13.4 11.6 - 15.4 % Final  09/17/2012 13.2 11.5 - 14.5 % Final

## 2021-02-06 NOTE — Telephone Encounter (Signed)
Medication: metFORMIN (GLUCOPHAGE) 500 MG tablet [[id number]] Pt reports he is out of this medication for 1 week  Has the patient contacted their pharmacy? YES  (Agent: If no, request that the patient contact the pharmacy for the refill. If patient does not wish to contact the pharmacy document the reason why and proceed with request.) (Agent: If yes, when and what did the pharmacy advise?)  Preferred Pharmacy (with phone number or street name): [id number] - [place], [place] - [address] [place] [address] Phone: [phone] Fax: [phone] Hours: Not open 24 hours   Has the patient been seen for an appointment in the last year OR does the patient have an upcoming appointment? YES 01/11/21  Agent: Please be advised that RX refills may take up to 3 business days. We ask that you follow-up with your pharmacy.

## 2021-02-06 NOTE — Telephone Encounter (Signed)
Requested Prescriptions  Pending Prescriptions Disp Refills   losartan (COZAAR) 25 MG tablet [Pharmacy Med Name: LOSARTAN POTASSIUM 25 MG TAB] 90 tablet 1    Sig: TAKE 1 TABLET (25 MG TOTAL) BY MOUTH DAILY.     Cardiovascular:  Angiotensin Receptor Blockers Passed - 02/04/2021  9:44 AM      Passed - Cr in normal range and within 180 days    Creatinine  Date Value Ref Range Status  09/18/2012 0.78 0.60 - 1.30 mg/dL Final   Creatinine, Ser  Date Value Ref Range Status  01/20/2021 0.83 0.76 - 1.27 mg/dL Final         Passed - K in normal range and within 180 days    Potassium  Date Value Ref Range Status  01/20/2021 4.7 3.5 - 5.2 mmol/L Final  09/18/2012 3.9 3.5 - 5.1 mmol/L Final         Passed - Patient is not pregnant      Passed - Last BP in normal range    BP Readings from Last 1 Encounters:  01/24/21 130/80         Passed - Valid encounter within last 6 months    Recent Outpatient Visits          3 weeks ago Hyperlipidemia, unspecified hyperlipidemia type   [facility] Vigg, Avanti, MD   1 month ago [place] McElwee, Lauren A, NP   2 months ago [place] Vigg, Avanti, MD   4 months ago Need for influenza vaccination   [facility] Vigg, Avanti, MD   6 months ago Diabetes mellitus without complication ([place])   [facility] Vigg, Avanti, MD      Future Appointments            In 2 months Vigg, Avanti, MD [place], [facility]            rosuvastatin (CRESTOR) 40 MG tablet [Pharmacy Med Name: ROSUVASTATIN CALCIUM 40 MG TAB] 90 tablet 1    Sig: TAKE 1 TABLET BY MOUTH EVERY DAY     Cardiovascular:  Antilipid - Statins 2 Failed - 02/04/2021  9:44 AM      Failed - Lipid Panel in normal range within the last 12 months    Cholesterol, Total  Date Value Ref Range Status  01/20/2021 124 100 - 199 mg/dL Final   Cholesterol Piccolo, Waived  Date Value Ref  Range Status  09/04/2018 120 <200 mg/dL Final    Comment:                            Desirable                <200                         Borderline High      200- 239                         High                     >239    LDL Chol Calc (NIH)  Date Value Ref Range Status  01/20/2021 53 0 - 99 mg/dL Final   HDL  Date Value Ref Range Status  01/20/2021 43 >39 mg/dL Final   Triglycerides  Date  Value Ref Range Status  01/20/2021 170 (H) 0 - 149 mg/dL Final   Triglycerides Piccolo,Waived  Date Value Ref Range Status  09/04/2018 169 (H) <150 mg/dL Final    Comment:                            Normal                   <150                         Borderline High     150 - 199                         High                200 - 499                         Very High                >499          Passed - Cr in normal range and within 360 days    Creatinine  Date Value Ref Range Status  09/18/2012 0.78 0.60 - 1.30 mg/dL Final   Creatinine, Ser  Date Value Ref Range Status  01/20/2021 0.83 0.76 - 1.27 mg/dL Final         Passed - Patient is not pregnant      Passed - Valid encounter within last 12 months    Recent Outpatient Visits          3 weeks ago Hyperlipidemia, unspecified hyperlipidemia type   [facility] Vigg, Avanti, MD   1 month ago COVID-19   [employer], Lauren A, NP   2 months ago Hypercholesteremia   [facility] Vigg, Avanti, MD   4 months ago Need for influenza vaccination   [facility] Vigg, Avanti, MD   6 months ago Diabetes mellitus without complication ([place])   [facility] Vigg, Avanti, MD      Future Appointments            In 2 months Vigg, Avanti, MD [facility], [facility]

## 2021-03-28 ENCOUNTER — Encounter: Payer: Self-pay | Admitting: Internal Medicine

## 2021-04-10 ENCOUNTER — Ambulatory Visit: Payer: BC Managed Care – PPO | Admitting: Pulmonary Disease

## 2021-04-11 ENCOUNTER — Ambulatory Visit: Payer: BC Managed Care – PPO | Admitting: Internal Medicine

## 2021-04-18 ENCOUNTER — Ambulatory Visit: Payer: BC Managed Care – PPO | Admitting: Internal Medicine

## 2021-04-20 ENCOUNTER — Other Ambulatory Visit: Payer: Self-pay | Admitting: Pulmonary Disease

## 2021-04-25 ENCOUNTER — Ambulatory Visit: Payer: BC Managed Care – PPO | Admitting: Internal Medicine

## 2021-06-26 ENCOUNTER — Other Ambulatory Visit: Payer: Self-pay | Admitting: Internal Medicine

## 2021-07-31 ENCOUNTER — Other Ambulatory Visit: Payer: Self-pay

## 2021-07-31 NOTE — Telephone Encounter (Signed)
LOV 1/32/22  No future appt noted

## 2021-07-31 NOTE — Telephone Encounter (Signed)
Please get scheduled with Junie Panning or Malachy Mood

## 2021-08-01 ENCOUNTER — Encounter: Payer: Self-pay | Admitting: Physician Assistant

## 2021-08-01 ENCOUNTER — Ambulatory Visit (INDEPENDENT_AMBULATORY_CARE_PROVIDER_SITE_OTHER): Payer: Medicare Other | Admitting: Physician Assistant

## 2021-08-01 ENCOUNTER — Ambulatory Visit: Payer: Self-pay

## 2021-08-01 VITALS — BP 138/82 | HR 69 | Temp 98.1°F | Ht 67.99 in | Wt 258.6 lb

## 2021-08-01 DIAGNOSIS — L247 Irritant contact dermatitis due to plants, except food: Secondary | ICD-10-CM | POA: Diagnosis not present

## 2021-08-01 MED ORDER — PREDNISONE 10 MG PO TABS: ORAL_TABLET | ORAL | 0 refills | Status: DC

## 2021-08-01 NOTE — Telephone Encounter (Signed)
    Chief Complaint: Poison ivy rash Symptoms: Blisters Frequency: Yesterday Pertinent Negatives: Patient denies fever Disposition: '[]'$ ED /'[]'$ Urgent Care (no appt availability in office) / '[x]'$ Appointment(In office/virtual)/ '[]'$  [facility]/ '[]'$ Home Care/ '[]'$ Refused Recommended Disposition /'[]'$ [place] [facility]/ '[]'$  Follow-up with PCP Additional Notes:   Reason for Disposition  MODERATE to SEVERE itching (e.g., interferes with work, school, sleep, or other activities)  Answer Assessment - Initial Assessment Questions 1. APPEARANCE of RASH: "Describe the rash."      Blisters 2. LOCATION: "Where is the rash located?"  (e.g., face, genitals, hands, legs)     Neck to waist 3. SIZE: "How large is the rash?"      Large 4. ONSET: "When did the rash begin?"      Saturday 5. ITCHING: "Does the rash itch?" If Yes, ask: "How bad is it?"   - MILD - doesn't interfere with normal activities   - MODERATE-SEVERE: interferes with work, school, sleep, or other activities      Moderate 6. EXPOSURE:  "How were you exposed to the plant (poison ivy, poison oak, sumac)"  "When were you exposed?"       Yes 7. PAST HISTORY: "Have you had a poison ivy rash before?" If Yes, ask: "How bad was it?"     No 8. PREGNANCY: "Is there any chance you are pregnant?" "When was your last menstrual period?"     N/a  Protocols used: [place] [facility]

## 2021-08-01 NOTE — Progress Notes (Signed)
Acute Office Visit   Patient: Robert Griffith   DOB: Aug 25, 1955   66 y.o. Male  MRN: [id number] Visit Date: 08/01/2021  Today's healthcare provider: Dani Gobble Yashika Mask, PA-C  Introduced myself to the patient as a Journalist, newspaper and provided education on APPs in clinical practice.    Chief Complaint  Patient presents with   Rash    Torso, neck and b/l upper arm, very itchy, keeps him up at night    Subjective    Rash   HPI     Rash    Additional comments: Torso, neck and b/l upper arm, very itchy, keeps him up at night       Last edited by Jerelene Redden, CMA on 08/01/2021  2:14 PM.        Reports he collected some sticks to build a fire and noticed the rash developing States it started Sat Reports the rash is itching and had blisters when it initially started  Rash is along chest, biceps and sides He has tried Cortisone cream and spray as well as benadryl Denies difficulty breathing or swelling consistent with severe allergic reaction       Medications: Outpatient Medications Prior to Visit  Medication Sig   aspirin 81 MG EC tablet Take 81 mg by mouth daily.     fexofenadine (ALLEGRA ALLERGY) 180 MG tablet Take 1 tablet (180 mg total) by mouth daily.   glucose blood test strip Use as instructed   guaiFENesin-codeine (CHERATUSSIN AC) 100-10 MG/5ML syrup Take 5 mLs by mouth 3 (three) times daily as needed for cough.   metFORMIN (GLUCOPHAGE) 500 MG tablet Take 2 tablets (1,000 mg total) by mouth 2 (two) times daily with a meal.   montelukast (SINGULAIR) 10 MG tablet TAKE 1 TABLET BY MOUTH EVERYDAY AT BEDTIME   ONE TOUCH ULTRA TEST test strip USE TO TEST BLOOD SUGAR DAILY (EX E11.9)   rosuvastatin (CRESTOR) 40 MG tablet TAKE 1 TABLET BY MOUTH EVERY DAY   Tiotropium Bromide Monohydrate (SPIRIVA RESPIMAT) 2.5 MCG/ACT AERS Inhale 2 puffs into the lungs daily.   albuterol (VENTOLIN HFA) 108 (90 Base) MCG/ACT inhaler Inhale 2 puffs into the lungs every 6 (six) hours as  needed for wheezing or shortness of breath. (Patient not taking: Reported on 08/01/2021)   [DISCONTINUED] losartan (COZAAR) 25 MG tablet TAKE 1 TABLET (25 MG TOTAL) BY MOUTH DAILY.   No facility-administered medications prior to visit.    Review of Systems  Skin:  Positive for rash.       Objective    BP 138/82   Pulse 69   Temp 98.1 F (36.7 C) (Oral)   Ht 5' 7.99" (1.727 m)   Wt 258 lb 9.6 oz (117.3 kg)   SpO2 95%   BMI 39.33 kg/m    Physical Exam Vitals reviewed.  Constitutional:      General: He is awake.     Appearance: Normal appearance. He is well-developed and well-groomed. He is obese.  HENT:     Head: Normocephalic and atraumatic.  Pulmonary:     Effort: Pulmonary effort is normal.  Musculoskeletal:     Cervical back: Normal range of motion and neck supple.  Skin:    General: Skin is warm.     Findings: Erythema and rash present. Rash is papular and vesicular.     Comments: Multiple large papulovesicular lesions along chest, sides and arms   Neurological:     Mental Status:  He is alert.  Psychiatric:        Attention and Perception: Attention normal.        Mood and Affect: Mood and affect normal.        Speech: Speech normal.        Behavior: Behavior normal. Behavior is cooperative.       No results found for any visits on 08/01/21.  Assessment & Plan      No follow-ups on file.       Problem List Items Addressed This Visit   None Visit Diagnoses     Irritant contact dermatitis due to plants, except food    -  Primary Acute, new problem Reports he developed an itchy rash after collected sticks for a bonfire Suspect allergic contact dermatitis (likely from poison oak, ivy, sumac)  Will manage with prolonged Prednisone taper to prevent rebound symptoms Recommend using topicals such as Calamine lotion, Benadryl cream and/or cortisone as desired to prevent scratching Can use antihistamine of choice to further assist with symptoms  Reviewed  rash care with patient to help prevent secondary infection - pt voiced understanding Follow up as needed    Relevant Medications   predniSONE (DELTASONE) 10 MG tablet        Return in about 1 month (around 09/01/2021) for Diabetes follow up.   I, Anterio Scheel E Kemontae Dunklee, PA-C, have reviewed all documentation for this visit. The documentation on 08/01/21 for the exam, diagnosis, procedures, and orders are all accurate and complete.   Talitha Givens, MHS, PA-C [facility]

## 2021-08-01 NOTE — Patient Instructions (Addendum)
To assist with the rash I recommend the following   You can use Calamine lotion on the areas that are itching as well as Cortisone if you like I am sending in a steroid taper to help reduce the itching  Please take the steroid in the morning to help reduce sleeplessness, watch your blood sugars as well as the steroid can make them higher than normal  You can use a daily antihistamine such as Allegra, Claritin or Zyrtec during the day to help with the itching  Benadryl at night is okay but it will probably just make you more sleepy than anything   I would like you to make an apt for your diabetes in the next 1-2 months so we can keep tabs on that.

## 2021-08-03 ENCOUNTER — Other Ambulatory Visit: Payer: Self-pay

## 2021-08-03 DIAGNOSIS — E119 Type 2 diabetes mellitus without complications: Secondary | ICD-10-CM

## 2021-08-03 MED ORDER — METFORMIN HCL 500 MG PO TABS: 1000.0000 mg | ORAL_TABLET | Freq: Two times a day (BID) | ORAL | 1 refills | Status: DC

## 2021-08-03 NOTE — Telephone Encounter (Signed)
Patient has DM follow up scheduled for September 5. Last seen in January.

## 2021-08-07 ENCOUNTER — Other Ambulatory Visit: Payer: Self-pay

## 2021-08-07 DIAGNOSIS — E7800 Pure hypercholesterolemia, unspecified: Secondary | ICD-10-CM

## 2021-08-07 MED ORDER — ROSUVASTATIN CALCIUM 40 MG PO TABS: 40.0000 mg | ORAL_TABLET | Freq: Every day | ORAL | 1 refills | Status: DC

## 2021-08-07 NOTE — Telephone Encounter (Signed)
LOV 08/01/21  Future appt 09/05/21

## 2021-08-18 ENCOUNTER — Encounter: Payer: Self-pay | Admitting: Internal Medicine

## 2021-08-30 ENCOUNTER — Encounter: Payer: Self-pay | Admitting: Physician Assistant

## 2021-08-31 ENCOUNTER — Other Ambulatory Visit: Payer: Self-pay

## 2021-08-31 NOTE — Telephone Encounter (Signed)
LOV 08/01/21  Future appt 09/07/21  Patient is currently out of medication

## 2021-09-04 MED ORDER — LOSARTAN POTASSIUM 25 MG PO TABS: 25.0000 mg | ORAL_TABLET | Freq: Every day | ORAL | 0 refills | Status: DC

## 2021-09-05 ENCOUNTER — Ambulatory Visit: Payer: Medicare Other | Admitting: Physician Assistant

## 2021-09-07 ENCOUNTER — Encounter: Payer: Self-pay | Admitting: Family Medicine

## 2021-09-07 ENCOUNTER — Ambulatory Visit: Payer: Medicare Other | Admitting: Unknown Physician Specialty

## 2021-09-07 ENCOUNTER — Ambulatory Visit (INDEPENDENT_AMBULATORY_CARE_PROVIDER_SITE_OTHER): Payer: Medicare Other | Admitting: Family Medicine

## 2021-09-07 VITALS — BP 128/77 | HR 60 | Temp 98.2°F | Wt 256.9 lb

## 2021-09-07 DIAGNOSIS — Z23 Encounter for immunization: Secondary | ICD-10-CM

## 2021-09-07 DIAGNOSIS — R3911 Hesitancy of micturition: Secondary | ICD-10-CM

## 2021-09-07 DIAGNOSIS — E7800 Pure hypercholesterolemia, unspecified: Secondary | ICD-10-CM | POA: Diagnosis not present

## 2021-09-07 DIAGNOSIS — I129 Hypertensive chronic kidney disease with stage 1 through stage 4 chronic kidney disease, or unspecified chronic kidney disease: Secondary | ICD-10-CM | POA: Diagnosis not present

## 2021-09-07 DIAGNOSIS — E1122 Type 2 diabetes mellitus with diabetic chronic kidney disease: Secondary | ICD-10-CM

## 2021-09-07 DIAGNOSIS — N181 Chronic kidney disease, stage 1: Secondary | ICD-10-CM

## 2021-09-07 LAB — MICROALBUMIN, URINE WAIVED
Creatinine, Urine Waived: 100 mg/dL (ref 10–300)
Microalb, Ur Waived: 30 mg/L — ABNORMAL HIGH (ref 0–19)
Microalb/Creat Ratio: <30 mg/g (ref <30)

## 2021-09-07 LAB — URINALYSIS, ROUTINE W REFLEX MICROSCOPIC
Bilirubin, UA: NEGATIVE
Glucose, UA: NEGATIVE
Ketones, UA: NEGATIVE
Leukocytes,UA: NEGATIVE
Nitrite, UA: NEGATIVE
Protein,UA: NEGATIVE
RBC, UA: NEGATIVE
Specific Gravity, UA: 1.02 (ref 1.005–1.030)
Urobilinogen, Ur: 0.2 mg/dL (ref 0.2–1.0)
pH, UA: 7 (ref 5.0–7.5)

## 2021-09-07 LAB — BAYER DCA HB A1C WAIVED: HB A1C (BAYER DCA - WAIVED): 6.7 % — ABNORMAL HIGH (ref 4.8–5.6)

## 2021-09-07 MED ORDER — LOSARTAN POTASSIUM 25 MG PO TABS
25.0000 mg | ORAL_TABLET | Freq: Every day | ORAL | 1 refills | Status: DC
Start: 2021-09-07 — End: 2021-12-15

## 2021-09-07 MED ORDER — METFORMIN HCL 500 MG PO TABS: 1000.0000 mg | ORAL_TABLET | Freq: Two times a day (BID) | ORAL | 1 refills | Status: DC

## 2021-09-07 MED ORDER — ALBUTEROL SULFATE HFA 108 (90 BASE) MCG/ACT IN AERS: 2.0000 | INHALATION_SPRAY | Freq: Four times a day (QID) | RESPIRATORY_TRACT | 4 refills | Status: DC | PRN

## 2021-09-07 MED ORDER — MONTELUKAST SODIUM 10 MG PO TABS: ORAL_TABLET | ORAL | 1 refills | Status: DC

## 2021-09-07 MED ORDER — ROSUVASTATIN CALCIUM 40 MG PO TABS: 40.0000 mg | ORAL_TABLET | Freq: Every day | ORAL | 1 refills | Status: DC

## 2021-09-07 NOTE — Progress Notes (Signed)
BP 128/77   Pulse 60   Temp 98.2 F (36.8 C)   Wt 256 lb 14.4 oz (116.5 kg)   SpO2 97%   BMI 39.07 kg/m    Subjective:    Patient ID: Robert Griffith, male    DOB: Aug 31, 1955, 66 y.o.   MRN: [id number]  HPI: Robert Griffith is a 66 y.o. male  Chief Complaint  Patient presents with   Diabetes    Patient has not had an eye exam this year    Hyperlipidemia   Pain    Patient states this week he noticed a pain in his right side that only happens when he lays down at night.    Hypertension   BACK PAIN Duration: about a week Mechanism of injury: no trauma Location: Right and low back Onset: sudden Severity: 7/10 Quality: sharp Frequency:  at night, with movement Radiation: none Aggravating factors: rolling over Alleviating factors: nothing Status: stable Treatments attempted: none  Relief with NSAIDs?: No NSAIDs Taken Nighttime pain:  yes Paresthesias / decreased sensation:  no Bowel / bladder incontinence:  no Fevers:  no Dysuria / urinary frequency:  no  DIABETES Hypoglycemic episodes:no Polydipsia/polyuria: no Visual disturbance: no Chest pain: no Paresthesias: no Glucose Monitoring: yes  Accucheck frequency: occasionally- fasting in the AM Taking Insulin?: no Blood Pressure Monitoring: not checking Retinal Examination: Up to Date Foot Exam: Not up to Date Diabetic Education: Completed Pneumovax: Up to Date Influenza: Up to Date Aspirin: yes  HYPERTENSION / HYPERLIPIDEMIA Satisfied with current treatment? yes Duration of hypertension: chronic BP monitoring frequency: not checking BP medication side effects: no Past BP meds: losartan Duration of hyperlipidemia: chronic Cholesterol medication side effects: no Cholesterol supplements: none Past cholesterol medications: crestor Medication compliance: excellent compliance Aspirin: yes Recent stressors: no Recurrent headaches: no Visual changes: no Palpitations: no Dyspnea: no Chest pain:  no Lower extremity edema: no Dizzy/lightheaded: no  Relevant past medical, surgical, family and social history reviewed and updated as indicated. Interim medical history since our last visit reviewed. Allergies and medications reviewed and updated.  Review of Systems  Constitutional: Negative.   Respiratory: Negative.    Cardiovascular: Negative.   Gastrointestinal: Negative.   Musculoskeletal: Negative.   Neurological: Negative.   Psychiatric/Behavioral: Negative.      Per HPI unless specifically indicated above     Objective:    BP 128/77   Pulse 60   Temp 98.2 F (36.8 C)   Wt 256 lb 14.4 oz (116.5 kg)   SpO2 97%   BMI 39.07 kg/m   Wt Readings from Last 3 Encounters:  09/07/21 256 lb 14.4 oz (116.5 kg)  08/01/21 258 lb 9.6 oz (117.3 kg)  01/24/21 258 lb 9.6 oz (117.3 kg)    Physical Exam Vitals and nursing note reviewed.  Constitutional:      General: He is not in acute distress.    Appearance: Normal appearance. He is obese. He is not ill-appearing, toxic-appearing or diaphoretic.  HENT:     Head: Normocephalic and atraumatic.     Right Ear: External ear normal.     Left Ear: External ear normal.     Nose: Nose normal.     Mouth/Throat:     Mouth: Mucous membranes are moist.     Pharynx: Oropharynx is clear.  Eyes:     General: No scleral icterus.       Right eye: No discharge.        Left eye: No discharge.  Extraocular Movements: Extraocular movements intact.     Conjunctiva/sclera: Conjunctivae normal.     Pupils: Pupils are equal, round, and reactive to light.  Cardiovascular:     Rate and Rhythm: Normal rate and regular rhythm.     Pulses: Normal pulses.     Heart sounds: Normal heart sounds. No murmur heard.    No friction rub. No gallop.  Pulmonary:     Effort: Pulmonary effort is normal. No respiratory distress.     Breath sounds: Normal breath sounds. No stridor. No wheezing, rhonchi or rales.  Chest:     Chest wall: No tenderness.   Musculoskeletal:        General: Normal range of motion.     Cervical back: Normal range of motion and neck supple.  Skin:    General: Skin is warm and dry.     Capillary Refill: Capillary refill takes less than 2 seconds.     Coloration: Skin is not jaundiced or pale.     Findings: No bruising, erythema, lesion or rash.  Neurological:     General: No focal deficit present.     Mental Status: He is alert and oriented to person, place, and time. Mental status is at baseline.  Psychiatric:        Mood and Affect: Mood normal.        Behavior: Behavior normal.        Thought Content: Thought content normal.        Judgment: Judgment normal.     Results for orders placed or performed in visit on 09/07/21  Urinalysis, Routine w reflex microscopic  Result Value Ref Range   Specific Gravity, UA 1.020 1.005 - 1.030   pH, UA 7.0 5.0 - 7.5   Color, UA Yellow Yellow   Appearance Ur Cloudy (A) Clear   Leukocytes,UA Negative Negative   Protein,UA Negative Negative/Trace   Glucose, UA Negative Negative   Ketones, UA Negative Negative   RBC, UA Negative Negative   Bilirubin, UA Negative Negative   Urobilinogen, Ur 0.2 0.2 - 1.0 mg/dL   Nitrite, UA Negative Negative  Microalbumin, Urine Waived  Result Value Ref Range   Microalb, Ur Waived 30 (H) 0 - 19 mg/L   Creatinine, Urine Waived 100 10 - 300 mg/dL   Microalb/Creat Ratio <30 <30 mg/g  Bayer DCA Hb A1c Waived  Result Value Ref Range   HB A1C (BAYER DCA - WAIVED) 6.7 (H) 4.8 - 5.6 %      Assessment & Plan:   Problem List Items Addressed This Visit       Endocrine   Type 2 diabetes mellitus with stage 1 chronic kidney disease, without long-term current use of insulin ([place]) - Primary    Doing well with A1c of 6.7. Continue current regimen. Call with any concerns.      Relevant Medications   rosuvastatin (CRESTOR) 40 MG tablet   metFORMIN (GLUCOPHAGE) 500 MG tablet   losartan (COZAAR) 25 MG tablet   Other Relevant Orders    Comprehensive metabolic panel   CBC with Differential/Platelet   Lipid Panel w/o Chol/HDL Ratio   PSA   TSH   Urinalysis, Routine w reflex microscopic (Completed)   Microalbumin, Urine Waived (Completed)   Bayer DCA Hb A1c Waived (Completed)     Genitourinary   Benign hypertensive renal disease    Under good control on current regimen. Continue current regimen. Continue to monitor. Call with any concerns. Refills given. Labs drawn today.  Other   Hypercholesteremia    Under good control on current regimen. Continue current regimen. Continue to monitor. Call with any concerns. Refills given. Labs drawn today.       Relevant Medications   rosuvastatin (CRESTOR) 40 MG tablet   losartan (COZAAR) 25 MG tablet   Other Visit Diagnoses     Hesitancy       Checking labs today. Await results.    Relevant Orders   PSA        Follow up plan: Return 3 months welcome to medicare.

## 2021-09-07 NOTE — Assessment & Plan Note (Signed)
Doing well with A1c of 6.7. Continue current regimen. Call with any concerns.

## 2021-09-07 NOTE — Assessment & Plan Note (Signed)
Under good control on current regimen. Continue current regimen. Continue to monitor. Call with any concerns. Refills given. Labs drawn today.   

## 2021-09-07 NOTE — Progress Notes (Deleted)
BP 128/77   Pulse 60   Temp 98.2 F (36.8 C)   Wt 256 lb 14.4 oz (116.5 kg)   SpO2 97%   BMI 39.07 kg/m    Subjective:    Patient ID: Robert Griffith, male    DOB: 05/26/1955, 66 y.o.   MRN: [id number]  HPI: Robert Griffith is a 66 y.o. male presenting on 09/07/2021 for comprehensive medical examination. Current medical complaints include:{Blank single:19197::"none","***"}  He currently lives with: Interim Problems from his last visit: {Blank single:19197::"yes","no"}  Depression Screen done today and results listed below:     08/01/2021    2:20 PM 01/11/2021    3:51 PM 09/28/2020    3:50 PM 07/18/2020    3:48 PM 05/20/2019    4:16 PM  Depression screen PHQ 2/9  Decreased Interest 0 0 0 0 0  Down, Depressed, Hopeless 0 0 0 0 0  PHQ - 2 Score 0 0 0 0 0  Altered sleeping 0 0   0  Tired, decreased energy 0 0   0  Change in appetite 0 0   0  Feeling bad or failure about yourself  0 0   0  Trouble concentrating 0 0   0  Moving slowly or fidgety/restless 0 0   0  Suicidal thoughts 0 0   0  PHQ-9 Score 0 0   0  Difficult doing work/chores Not difficult at all        The patient {has/does not OBSJ:62836} a history of falls. I {did/did not:19850} complete a risk assessment for falls. A plan of care for falls {was/was not:19852} documented.   Past Medical History:  Past Medical History:  Diagnosis Date   Diabetes mellitus without complication ([place])    Hyperlipidemia    Hypertension    Kidney stone    Obesity    Restless legs    Wears dentures    Has full upper and lower, only wears upper    Surgical History:  Past Surgical History:  Procedure Laterality Date   COLONOSCOPY N/A 09/12/2020   Procedure: COLONOSCOPY;  Surgeon: Lucilla Lame, MD;  Location: [facility];  Service: Endoscopy;  Laterality: N/A;  Diabetic   left toe surgery     POLYPECTOMY N/A 09/12/2020   Procedure: POLYPECTOMY;  Surgeon: Lucilla Lame, MD;  Location: [facility];  Service:  Endoscopy;  Laterality: N/A;   sleep apnea surgery     TONSILLECTOMY AND ADENOIDECTOMY      Medications:  Current Outpatient Medications on File Prior to Visit  Medication Sig   aspirin 81 MG EC tablet Take 81 mg by mouth daily.     cetirizine (ZYRTEC) 10 MG tablet Take 10 mg by mouth daily.   glucose blood test strip Use as instructed   losartan (COZAAR) 25 MG tablet Take 1 tablet (25 mg total) by mouth daily.   metFORMIN (GLUCOPHAGE) 500 MG tablet Take 2 tablets (1,000 mg total) by mouth 2 (two) times daily with a meal.   montelukast (SINGULAIR) 10 MG tablet TAKE 1 TABLET BY MOUTH EVERYDAY AT BEDTIME   ONE TOUCH ULTRA TEST test strip USE TO TEST BLOOD SUGAR DAILY (EX E11.9)   rosuvastatin (CRESTOR) 40 MG tablet Take 1 tablet (40 mg total) by mouth daily.   albuterol (VENTOLIN HFA) 108 (90 Base) MCG/ACT inhaler Inhale 2 puffs into the lungs every 6 (six) hours as needed for wheezing or shortness of breath. (Patient not taking: Reported on 08/01/2021)   No  current facility-administered medications on file prior to visit.    Allergies:  Allergies  Allergen Reactions   Other     Bananas: indigestion    Social History:  Social History   Socioeconomic History   Marital status: Married    Spouse name: Not on file   Number of children: Not on file   Years of education: Not on file   Highest education level: Not on file  Occupational History   Not on file  Tobacco Use   Smoking status: Never   Smokeless tobacco: Never  Vaping Use   Vaping Use: Never used  Substance and Sexual Activity   Alcohol use: No   Drug use: No   Sexual activity: Not on file  Other Topics Concern   Not on file  Social History Narrative   Not on file   Social Determinants of Health   Financial Resource Strain: Not on file  Food Insecurity: Not on file  Transportation Needs: Not on file  Physical Activity: Not on file  Stress: Not on file  Social Connections: Not on file  Intimate Partner  Violence: Not on file   Social History   Tobacco Use  Smoking Status Never  Smokeless Tobacco Never   Social History   Substance and Sexual Activity  Alcohol Use No    Family History:  Family History  Problem Relation Age of Onset   Heart attack Mother    Heart disease Mother    Heart disease Father    Stroke Brother    Coronary artery disease Other        4 family members    Past medical history, surgical history, medications, allergies, family history and social history reviewed with patient today and changes made to appropriate areas of the chart.   ROS All other ROS negative except what is listed above and in the HPI.      Objective:    BP 128/77   Pulse 60   Temp 98.2 F (36.8 C)   Wt 256 lb 14.4 oz (116.5 kg)   SpO2 97%   BMI 39.07 kg/m   Wt Readings from Last 3 Encounters:  09/07/21 256 lb 14.4 oz (116.5 kg)  08/01/21 258 lb 9.6 oz (117.3 kg)  01/24/21 258 lb 9.6 oz (117.3 kg)    Physical Exam  Results for orders placed or performed in visit on 01/20/21  Pulmonary Function Test [facility] Only  Result Value Ref Range   FVC-Pre 3.31 L   FVC-%Pred-Pre 78 %   FVC-Post 3.33 L   FVC-%Pred-Post 79 %   FVC-%Change-Post 0 %   FEV1-Pre 2.54 L   FEV1-%Pred-Pre 81 %   FEV1-Post 2.61 L   FEV1-%Pred-Post 83 %   FEV1-%Change-Post 2 %   FEV6-Pre 3.30 L   FEV6-%Pred-Pre 83 %   FEV6-Post 3.33 L   FEV6-%Pred-Post 83 %   FEV6-%Change-Post 1 %   Pre FEV1/FVC ratio 77 %   FEV1FVC-%Pred-Pre 103 %   Post FEV1/FVC ratio 78 %   FEV1FVC-%Change-Post 2 %   Pre FEV6/FVC Ratio 100 %   FEV6FVC-%Pred-Pre 105 %   Post FEV6/FVC ratio 100 %   FEV6FVC-%Pred-Post 105 %   FEV6FVC-%Change-Post 0 %   FEF 25-75 Pre 2.02 L/sec   FEF2575-%Pred-Pre 82 %   FEF 25-75 Post 2.19 L/sec   FEF2575-%Pred-Post 89 %   FEF2575-%Change-Post 8 %   RV 2.19 L   RV % pred 95 %   TLC 6.51 L   TLC %  pred 98 %   DLCO unc 23.44 ml/min/mmHg   DLCO unc % pred 94 %   DL/VA 4.30 ml/min/mmHg/L    DL/VA % pred 103 %      Assessment & Plan:   Problem List Items Addressed This Visit   None    Discussed aspirin prophylaxis for myocardial infarction prevention and decision was {Blank single:19197::"it was not indicated","made to continue ASA","made to start ASA","made to stop ASA","that we recommended ASA, and patient refused"}  LABORATORY TESTING:  Health maintenance labs ordered today as discussed above.   The natural history of prostate cancer and ongoing controversy regarding screening and potential treatment outcomes of prostate cancer has been discussed with the patient. The meaning of a false positive PSA and a false negative PSA has been discussed. He indicates understanding of the limitations of this screening test and wishes *** to proceed with screening PSA testing.   IMMUNIZATIONS:   - Tdap: Tetanus vaccination status reviewed: {tetanus status:315746}. - Influenza: {Blank single:19197::"Up to date","Administered today","Postponed to flu season","Refused","Given elsewhere"} - Pneumovax: {Blank single:19197::"Up to date","Administered today","Not applicable","Refused","Given elsewhere"} - Prevnar: {Blank single:19197::"Up to date","Administered today","Not applicable","Refused","Given elsewhere"} - COVID: {Blank single:19197::"Up to date","Administered today","Not applicable","Refused","Given elsewhere"} - HPV: {Blank single:19197::"Up to date","Administered today","Not applicable","Refused","Given elsewhere"} - Shingrix vaccine: {Blank single:19197::"Up to date","Administered today","Not applicable","Refused","Given elsewhere"}  SCREENING: - Colonoscopy: {Blank single:19197::"Up to date","Ordered today","Not applicable","Refused","Done elsewhere"}  Discussed with patient purpose of the colonoscopy is to detect colon cancer at curable precancerous or early stages   - AAA Screening: {Blank single:19197::"Up to date","Ordered today","Not applicable","Refused","Done  elsewhere"}  -Hearing Test: {Blank single:19197::"Up to date","Ordered today","Not applicable","Refused","Done elsewhere"}  -Spirometry: {Blank single:19197::"Up to date","Ordered today","Not applicable","Refused","Done elsewhere"}   PATIENT COUNSELING:    Sexuality: Discussed sexually transmitted diseases, partner selection, use of condoms, avoidance of unintended pregnancy  and contraceptive alternatives.   Advised to avoid cigarette smoking.  I discussed with the patient that most people either abstain from alcohol or drink within safe limits (<=14/week and <=4 drinks/occasion for males, <=7/weeks and <= 3 drinks/occasion for females) and that the risk for alcohol disorders and other health effects rises proportionally with the number of drinks per week and how often a drinker exceeds daily limits.  Discussed cessation/primary prevention of drug use and availability of treatment for abuse.   Diet: Encouraged to adjust caloric intake to maintain  or achieve ideal body weight, to reduce intake of dietary saturated fat and total fat, to limit sodium intake by avoiding high sodium foods and not adding table salt, and to maintain adequate dietary potassium and calcium preferably from fresh fruits, vegetables, and low-fat dairy products.    stressed the importance of regular exercise  Injury prevention: Discussed safety belts, safety helmets, smoke detector, smoking near bedding or upholstery.   Dental health: Discussed importance of regular tooth brushing, flossing, and dental visits.   Follow up plan: NEXT PREVENTATIVE PHYSICAL DUE IN 1 YEAR. No follow-ups on file.

## 2021-09-08 LAB — COMPREHENSIVE METABOLIC PANEL
ALT: 34 IU/L (ref 0–44)
AST: 37 IU/L (ref 0–40)
Albumin/Globulin Ratio: 1.8 (ref 1.2–2.2)
Albumin: 4.4 g/dL (ref 3.9–4.9)
Alkaline Phosphatase: 68 IU/L (ref 44–121)
BUN/Creatinine Ratio: 12 (ref 10–24)
BUN: 9 mg/dL (ref 8–27)
Bilirubin Total: 0.6 mg/dL (ref 0.0–1.2)
CO2: 22 mmol/L (ref 20–29)
Calcium: 9.3 mg/dL (ref 8.6–10.2)
Chloride: 102 mmol/L (ref 96–106)
Creatinine, Ser: 0.78 mg/dL (ref 0.76–1.27)
Globulin, Total: 2.4 g/dL (ref 1.5–4.5)
Glucose: 139 mg/dL — ABNORMAL HIGH (ref 70–99)
Potassium: 4.4 mmol/L (ref 3.5–5.2)
Sodium: 139 mmol/L (ref 134–144)
Total Protein: 6.8 g/dL (ref 6.0–8.5)
eGFR: 98 mL/min/{1.73_m2} (ref >59)

## 2021-09-08 LAB — CBC WITH DIFFERENTIAL/PLATELET
Basophils Absolute: 0.1 10*3/uL (ref 0.0–0.2)
Basos: 1 %
EOS (ABSOLUTE): 0.3 10*3/uL (ref 0.0–0.4)
Eos: 4 %
Hematocrit: 42.5 % (ref 37.5–51.0)
Hemoglobin: 14.6 g/dL (ref 13.0–17.7)
Immature Grans (Abs): 0 10*3/uL (ref 0.0–0.1)
Immature Granulocytes: 0 %
Lymphocytes Absolute: 2.1 10*3/uL (ref 0.7–3.1)
Lymphs: 33 %
MCH: 30.5 pg (ref 26.6–33.0)
MCHC: 34.4 g/dL (ref 31.5–35.7)
MCV: 89 fL (ref 79–97)
Monocytes Absolute: 0.5 10*3/uL (ref 0.1–0.9)
Monocytes: 8 %
Neutrophils Absolute: 3.4 10*3/uL (ref 1.4–7.0)
Neutrophils: 54 %
Platelets: 214 10*3/uL (ref 150–450)
RBC: 4.79 x10E6/uL (ref 4.14–5.80)
RDW: 13.4 % (ref 11.6–15.4)
WBC: 6.2 10*3/uL (ref 3.4–10.8)

## 2021-09-08 LAB — TSH: TSH: 2.61 u[IU]/mL (ref 0.450–4.500)

## 2021-09-08 LAB — LIPID PANEL W/O CHOL/HDL RATIO
Cholesterol, Total: 106 mg/dL (ref 100–199)
HDL: 38 mg/dL — ABNORMAL LOW (ref >39)
LDL Chol Calc (NIH): 40 mg/dL (ref 0–99)
Triglycerides: 165 mg/dL — ABNORMAL HIGH (ref 0–149)
VLDL Cholesterol Cal: 28 mg/dL (ref 5–40)

## 2021-09-08 LAB — PSA: Prostate Specific Ag, Serum: 1 ng/mL (ref 0.0–4.0)

## 2021-11-11 ENCOUNTER — Other Ambulatory Visit: Payer: Self-pay

## 2021-11-11 DIAGNOSIS — R109 Unspecified abdominal pain: Secondary | ICD-10-CM | POA: Insufficient documentation

## 2021-11-11 DIAGNOSIS — I10 Essential (primary) hypertension: Secondary | ICD-10-CM | POA: Diagnosis not present

## 2021-11-11 DIAGNOSIS — E119 Type 2 diabetes mellitus without complications: Secondary | ICD-10-CM | POA: Diagnosis not present

## 2021-11-11 LAB — CBC WITH DIFFERENTIAL/PLATELET
Abs Immature Granulocytes: 0.03 10*3/uL (ref 0.00–0.07)
Basophils Absolute: 0.1 10*3/uL (ref 0.0–0.1)
Basophils Relative: 1 %
Eosinophils Absolute: 0.4 10*3/uL (ref 0.0–0.5)
Eosinophils Relative: 4 %
HCT: 41.9 % (ref 39.0–52.0)
Hemoglobin: 14.1 g/dL (ref 13.0–17.0)
Immature Granulocytes: 0 %
Lymphocytes Relative: 23 %
Lymphs Abs: 2.4 10*3/uL (ref 0.7–4.0)
MCH: 29.5 pg (ref 26.0–34.0)
MCHC: 33.7 g/dL (ref 30.0–36.0)
MCV: 87.7 fL (ref 80.0–100.0)
Monocytes Absolute: 0.9 10*3/uL (ref 0.1–1.0)
Monocytes Relative: 9 %
Neutro Abs: 6.6 10*3/uL (ref 1.7–7.7)
Neutrophils Relative %: 63 %
Platelets: 197 10*3/uL (ref 150–400)
RBC: 4.78 MIL/uL (ref 4.22–5.81)
RDW: 12.5 % (ref 11.5–15.5)
WBC: 10.4 10*3/uL (ref 4.0–10.5)
nRBC: 0 % (ref 0.0–0.2)

## 2021-11-11 LAB — URINALYSIS, ROUTINE W REFLEX MICROSCOPIC
Bilirubin Urine: NEGATIVE
Glucose, UA: NEGATIVE mg/dL
Hgb urine dipstick: NEGATIVE
Ketones, ur: NEGATIVE mg/dL
Leukocytes,Ua: NEGATIVE
Nitrite: NEGATIVE
Protein, ur: NEGATIVE mg/dL
Specific Gravity, Urine: 1.024 (ref 1.005–1.030)
pH: 5 (ref 5.0–8.0)

## 2021-11-11 LAB — COMPREHENSIVE METABOLIC PANEL
ALT: 28 U/L (ref 0–44)
AST: 28 U/L (ref 15–41)
Albumin: 4.2 g/dL (ref 3.5–5.0)
Alkaline Phosphatase: 62 U/L (ref 38–126)
Anion gap: 8 (ref 5–15)
BUN: 19 mg/dL (ref 8–23)
CO2: 26 mmol/L (ref 22–32)
Calcium: 9 mg/dL (ref 8.9–10.3)
Chloride: 106 mmol/L (ref 98–111)
Creatinine, Ser: 1.14 mg/dL (ref 0.61–1.24)
GFR, Estimated: >60 mL/min (ref >60)
Glucose, Bld: 162 mg/dL — ABNORMAL HIGH (ref 70–99)
Potassium: 3.9 mmol/L (ref 3.5–5.1)
Sodium: 140 mmol/L (ref 135–145)
Total Bilirubin: 0.6 mg/dL (ref 0.3–1.2)
Total Protein: 7.5 g/dL (ref 6.5–8.1)

## 2021-11-11 NOTE — ED Triage Notes (Signed)
Pt c/o R flank pain for approx 6 wks. Sts he saw his doctor who told him it was probably a muscle but sts the pain worsened tonight.

## 2021-11-12 ENCOUNTER — Emergency Department
Admission: EM | Admit: 2021-11-12 | Discharge: 2021-11-12 | Disposition: A | Discharge status: Home / Self Care | Payer: Medicare Other | Attending: Emergency Medicine | Admitting: Emergency Medicine

## 2021-11-12 ENCOUNTER — Emergency Department: Payer: Medicare Other

## 2021-11-12 DIAGNOSIS — R109 Unspecified abdominal pain: Secondary | ICD-10-CM

## 2021-11-12 MED ORDER — KETOROLAC TROMETHAMINE 30 MG/ML IJ SOLN
30.0000 mg | Freq: Once | INTRAMUSCULAR | Status: AC
  Administered 2021-11-12: 30 mg via INTRAVENOUS
  Filled 2021-11-12: qty 1

## 2021-11-12 NOTE — ED Provider Notes (Signed)
[facility] Provider Note    Event Date/Time   First MD Initiated Contact with Patient 11/12/21 0037     (approximate)   History   Flank Pain   HPI  Robert Griffith is a 66 y.o. male   Past medical history of hypertension, hyperlipidemia, diabetes and kidney stones presents with right-sided flank pain that has been bothering him for several weeks but acutely worsened today.  It is reminiscent of kidney stones that he has had in the past.  He has taken ibuprofen.  He also works as a [employer] often but does not recall a traumatic injury.  Is worse with movement.  No radiation and no dysuria.  No systemic symptoms like fevers chills nausea vomiting.  Denies chest pain.  Has no testicular pain.  The pain does not radiate anywhere.  History was obtained via patient.  Independent historian includes his wife is at bedside offering collateral information.      Physical Exam   Triage Vital Signs: ED Triage Vitals  Enc Vitals Group     BP 11/11/21 2221 127/71     Pulse Rate 11/11/21 2221 69     Resp 11/11/21 2221 20     Temp 11/11/21 2221 98.7 F (37.1 C)     Temp Source 11/11/21 2221 Oral     SpO2 11/11/21 2221 99 %     Weight 11/11/21 2221 250 lb (113.4 kg)     Height 11/11/21 2221 '5\' 8"'$  (1.727 m)     Head Circumference --      Peak Flow --      Pain Score 11/11/21 2231 10     Pain Loc --      Pain Edu? --      Excl. in [place]? --     Most recent vital signs: Vitals:   11/11/21 2221 11/12/21 0037  BP: 127/71 123/69  Pulse: 69 75  Resp: 20 20  Temp: 98.7 F (37.1 C) 98.6 F (37 C)  SpO2: 99% 99%    General: Awake, no distress.  CV:  Good peripheral perfusion.  Resp:  Normal effort.  Abd:  No distention.  nontender to palpation in all quadrants. Other:  Is no midline thoracic or L-spine tenderness he does have tenderness to the palpation of the right paraspinal area.   ED Results / Procedures / Treatments   Labs (all  labs ordered are listed, but only abnormal results are displayed) Labs Reviewed  COMPREHENSIVE METABOLIC PANEL - Abnormal; Notable for the following components:      Result Value   Glucose, Bld 162 (*)    All other components within normal limits  URINALYSIS, ROUTINE W REFLEX MICROSCOPIC - Abnormal; Notable for the following components:   Color, Urine YELLOW (*)    APPearance HAZY (*)    All other components within normal limits  CBC WITH DIFFERENTIAL/PLATELET     I reviewed labs and they are notable for normal-appearing urinalysis no signs of infection    RADIOLOGY I independently reviewed and interpreted CT of the abdomen pelvis and see no renal stones or hydronephrosis.   PROCEDURES:  Critical Care performed: No  Procedures   MEDICATIONS ORDERED IN ED: Medications  ketorolac (TORADOL) 30 MG/ML injection 30 mg (30 mg Intravenous Given 11/12/21 0129)     IMPRESSION / MDM / ASSESSMENT AND PLAN / ED COURSE  I reviewed the triage vital signs and the nursing notes.  Differential diagnosis includes, but is not limited to, renal colic, kidney stone, pyelonephritis, musculoskeletal strain, renal infarct, intra-abdominal infection is less likely    MDM: Concern for kidney stone given history of the same, obtain a CT of the abdomen pelvis without contrast to assess for kidney stones, obstruction, intra-abdominal infections.  Think less likely intra-abdominal infection given no tenderness to palpation of the abdomen.  No signs of urinary tract infection or pyelonephritis on a clean urinalysis.  I considered renal infarct but he has low risk factors and normal creatinine I think this is less likely.  Most likely musculoskeletal pain with tenderness with palpation of the paraspinal lumbar region and his physical activities that aggravate the pain.    CT scan was negative for any acute emergent pathologies.   Anticipatory guidance pain control and  follow-up with PMD, return to the emergency department if any worsening.  Dispo: After careful consideration of this patient's presentation, medical and social risk factors, and evaluation in the emergency department I engaged in shared decision making with the patient and/or their representative to consider admission or observation and this patient was ultimately dc'd because work-up in the emergency department was negative for acute emergent pathologies including CT scan of the abdomen pelvis, most likely musculoskeletal strain but the patient is advised to come back to the emergency department if worsening..   Patient's presentation is most consistent with acute presentation with potential threat to life or bodily function.       FINAL CLINICAL IMPRESSION(S) / ED DIAGNOSES   Final diagnoses:  Right flank pain     Rx / DC Orders   ED Discharge Orders     None        Note:  This document was prepared using Dragon voice recognition software and may include unintentional dictation errors.    Lucillie Garfinkel, MD 11/12/21 [phone]

## 2021-11-12 NOTE — Discharge Instructions (Signed)
Take acetaminophen 650 mg and ibuprofen 400 mg every 6 hours for pain.  Take with food. Thank you for choosing [place] for your health care today!  Please see your primary doctor this week for a follow up appointment.   If you do not have a primary doctor call the following clinics to establish care:  If you have insurance:  [facility] [phone] [place] [address]   [facility]  [phone] [place] [place] [address]   If you do not have insurance:  [facility]  [phone] [address], [place] [address]  Sometimes, in the early stages of certain disease courses it is difficult to detect in the emergency department evaluation -- so, it is important that you continue to monitor your symptoms and call your doctor right away or return to the emergency department if you develop any new or worsening symptoms.  It was my pleasure to care for you today.   Hoover Brunette Jacelyn Grip, MD

## 2021-11-12 NOTE — ED Notes (Signed)
Dc instructions given verbally and in writing, understanding voiced, siganture obtained.  Pt left in stable condition with spouse to POV.

## 2021-11-13 ENCOUNTER — Telehealth: Payer: Self-pay

## 2021-11-13 NOTE — Telephone Encounter (Signed)
Transition Care Management Follow-up Telephone Call Date of discharge and from where: 11/12/2021, [facility]. How have you been since you were released from the hospital? Patient says he was doing OK up until this morning, when he started to put his undergarments on and started having pain again. Patient says he has been using heating pad, ibuprofen and tylenol.  Any questions or concerns? No  Items Reviewed: Did the pt receive and understand the discharge instructions provided? Yes  Medications obtained and verified? No  Other? No  Any new allergies since your discharge? No  Dietary orders reviewed? No Do you have support at home? Yes   Home Care and Equipment/Supplies: Were home health services ordered? not applicable If so, what is the name of the agency?   Has the agency set up a time to come to the patient's home? not applicable Were any new equipment or medical supplies ordered?  No What is the name of the medical supply agency?  Were you able to get the supplies/equipment? not applicable Do you have any questions related to the use of the equipment or supplies? No  Functional Questionnaire: (I = Independent and D = Dependent) ADLs: I   Bathing/Dressing- I  Meal Prep- I  Eating- I  Maintaining continence- I  Transferring/Ambulation- I  Managing Meds- I  Follow up appointments reviewed:  PCP Hospital f/u appt confirmed? Yes  Scheduled to see Dr.Johnson on 11/14/2021 at 10:40 am. [facility] f/u appt confirmed? No . Are transportation arrangements needed? No  If their condition worsens, is the pt aware to call PCP or go to the Emergency Dept.? Yes Was the patient provided with contact information for the PCP's office or ED? Yes Was to pt encouraged to call back with questions or concerns? Yes

## 2021-11-14 ENCOUNTER — Ambulatory Visit (INDEPENDENT_AMBULATORY_CARE_PROVIDER_SITE_OTHER): Payer: Medicare Other | Admitting: Family Medicine

## 2021-11-14 ENCOUNTER — Encounter: Payer: Self-pay | Admitting: Family Medicine

## 2021-11-14 VITALS — BP 135/78 | HR 58 | Temp 98.7°F | Ht 68.0 in | Wt 259.3 lb

## 2021-11-14 DIAGNOSIS — M545 Low back pain, unspecified: Secondary | ICD-10-CM

## 2021-11-14 MED ORDER — MELOXICAM 15 MG PO TABS: 15.0000 mg | ORAL_TABLET | Freq: Every day | ORAL | 1 refills | Status: DC

## 2021-11-14 MED ORDER — BACLOFEN 10 MG PO TABS: 5.0000 mg | ORAL_TABLET | Freq: Every evening | ORAL | 2 refills | Status: DC | PRN

## 2021-11-14 NOTE — Progress Notes (Signed)
BP 135/78 (BP Location: Left Arm, Cuff Size: Normal)   Pulse (!) 58   Temp 98.7 F (37.1 C) (Oral)   Ht '5\' 8"'$  (1.727 m)   Wt 259 lb 4.8 oz (117.6 kg)   SpO2 97%   BMI 39.43 kg/m    Subjective:    Patient ID: Robert Griffith, male    DOB: 1955/05/29, 66 y.o.   MRN: [id number]  HPI: Robert Griffith is a 66 y.o. male  Chief Complaint  Patient presents with   Flank Pain    Patient is here for follow up on Flank Pain. Patient says things are getting better.    ER FOLLOW UP Time since discharge: 2 days Hospital/facility: [facility] Diagnosis: Flank pain, negative CT Procedures/tests: CLINICAL DATA:  Flank pain.  Concern for kidney stone.   EXAM: CT ABDOMEN AND PELVIS WITHOUT CONTRAST   TECHNIQUE: Multidetector CT imaging of the abdomen and pelvis was performed following the standard protocol without IV contrast.   RADIATION DOSE REDUCTION: This exam was performed according to the departmental dose-optimization program which includes automated exposure control, adjustment of the mA and/or kV according to patient size and/or use of iterative reconstruction technique.   COMPARISON:  CT abdomen pelvis dated 01/19/2016.   FINDINGS: Evaluation of this exam is limited in the absence of intravenous contrast.   Lower chest: Bibasilar atelectasis. The visualized lung bases are otherwise clear.   No intra-abdominal free air or free fluid.   Hepatobiliary: Fatty liver. No biliary dilatation. The gallbladder is unremarkable.   Pancreas: Unremarkable. No pancreatic ductal dilatation or surrounding inflammatory changes.   Spleen: Normal in size without focal abnormality.   Adrenals/Urinary Tract: The adrenal glands are unremarkable. There is no hydronephrosis or nephrolithiasis on either side. There is a 1 cm right renal inferior pole hypodense focus which is not characterized on this CT, likely a cyst. Further evaluation with ultrasound on a nonemergent/outpatient basis  recommended. The visualized ureters and urinary bladder appear unremarkable.   Stomach/Bowel: Several scattered colonic diverticula without active inflammatory changes. There is no bowel obstruction or active inflammation. The appendix is normal.   Vascular/Lymphatic: Mild aortoiliac atherosclerotic disease. The IVC is unremarkable. No portal venous gas. There is no adenopathy.   Reproductive: The prostate and seminal vesicles are grossly unremarkable.   Other: None   Musculoskeletal: Mild degenerative changes. No acute osseous pathology.   IMPRESSION: 1. No acute intra-abdominal or pelvic pathology. No hydronephrosis or nephrolithiasis. 2. Colonic diverticulosis. No bowel obstruction. Normal appendix. 3. Fatty liver. 4.  Aortic Atherosclerosis (ICD10-I70.0). Consultants: None New medications: Toradol Discharge instructions:  Follow up here Status: better  BACK PAIN Duration: 3 days Mechanism of injury: unknown Location: Right and low back Onset: sudden Severity: severe- now doen to a 5/10 Quality: sharp and spasm Frequency: intermittent Radiation: none Aggravating factors: movement, breathing deep Alleviating factors: NSAIDs Status: better Treatments attempted: rest, ice, heat, APAP, and ibuprofen  Relief with NSAIDs?: moderate Nighttime pain:  no Paresthesias / decreased sensation:  no Bowel / bladder incontinence:  no Fevers:  no Dysuria / urinary frequency:  no   Relevant past medical, surgical, family and social history reviewed and updated as indicated. Interim medical history since our last visit reviewed. Allergies and medications reviewed and updated.  Review of Systems  Constitutional: Negative.   Respiratory: Negative.    Cardiovascular: Negative.   Gastrointestinal: Negative.   Musculoskeletal:  Positive for back pain and myalgias. Negative for arthralgias, gait problem, joint swelling, neck pain and  neck stiffness.  Skin: Negative.    Psychiatric/Behavioral: Negative.      Per HPI unless specifically indicated above     Objective:    BP 135/78 (BP Location: Left Arm, Cuff Size: Normal)   Pulse (!) 58   Temp 98.7 F (37.1 C) (Oral)   Ht '5\' 8"'$  (1.727 m)   Wt 259 lb 4.8 oz (117.6 kg)   SpO2 97%   BMI 39.43 kg/m   Wt Readings from Last 3 Encounters:  11/14/21 259 lb 4.8 oz (117.6 kg)  11/11/21 250 lb (113.4 kg)  09/07/21 256 lb 14.4 oz (116.5 kg)    Physical Exam Vitals and nursing note reviewed.  Constitutional:      General: He is not in acute distress.    Appearance: Normal appearance. He is obese. He is not ill-appearing, toxic-appearing or diaphoretic.  HENT:     Head: Normocephalic and atraumatic.     Right Ear: External ear normal.     Left Ear: External ear normal.     Nose: Nose normal.     Mouth/Throat:     Mouth: Mucous membranes are moist.     Pharynx: Oropharynx is clear.  Eyes:     General: No scleral icterus.       Right eye: No discharge.        Left eye: No discharge.     Extraocular Movements: Extraocular movements intact.     Conjunctiva/sclera: Conjunctivae normal.     Pupils: Pupils are equal, round, and reactive to light.  Cardiovascular:     Rate and Rhythm: Normal rate and regular rhythm.     Pulses: Normal pulses.     Heart sounds: Normal heart sounds. No murmur heard.    No friction rub. No gallop.  Pulmonary:     Effort: Pulmonary effort is normal. No respiratory distress.     Breath sounds: Normal breath sounds. No stridor. No wheezing, rhonchi or rales.  Chest:     Chest wall: No tenderness.  Musculoskeletal:        General: Tenderness present. No swelling, deformity or signs of injury.     Cervical back: Normal range of motion and neck supple.     Right lower leg: No edema.     Left lower leg: No edema.  Skin:    General: Skin is warm and dry.     Capillary Refill: Capillary refill takes less than 2 seconds.     Coloration: Skin is not jaundiced or pale.      Findings: No bruising, erythema, lesion or rash.  Neurological:     General: No focal deficit present.     Mental Status: He is alert and oriented to person, place, and time. Mental status is at baseline.  Psychiatric:        Mood and Affect: Mood normal.        Behavior: Behavior normal.        Thought Content: Thought content normal.        Judgment: Judgment normal.     Results for orders placed or performed during the hospital encounter of 11/12/21  CBC with Differential  Result Value Ref Range   WBC 10.4 4.0 - 10.5 K/uL   RBC 4.78 4.22 - 5.81 MIL/uL   Hemoglobin 14.1 13.0 - 17.0 g/dL   HCT 41.9 39.0 - 52.0 %   MCV 87.7 80.0 - 100.0 fL   MCH 29.5 26.0 - 34.0 pg   MCHC 33.7 30.0 - 36.0 g/dL  RDW 12.5 11.5 - 15.5 %   Platelets 197 150 - 400 K/uL   nRBC 0.0 0.0 - 0.2 %   Neutrophils Relative % 63 %   Neutro Abs 6.6 1.7 - 7.7 K/uL   Lymphocytes Relative 23 %   Lymphs Abs 2.4 0.7 - 4.0 K/uL   Monocytes Relative 9 %   Monocytes Absolute 0.9 0.1 - 1.0 K/uL   Eosinophils Relative 4 %   Eosinophils Absolute 0.4 0.0 - 0.5 K/uL   Basophils Relative 1 %   Basophils Absolute 0.1 0.0 - 0.1 K/uL   Immature Granulocytes 0 %   Abs Immature Granulocytes 0.03 0.00 - 0.07 K/uL  Comprehensive metabolic panel  Result Value Ref Range   Sodium 140 135 - 145 mmol/L   Potassium 3.9 3.5 - 5.1 mmol/L   Chloride 106 98 - 111 mmol/L   CO2 26 22 - 32 mmol/L   Glucose, Bld 162 (H) 70 - 99 mg/dL   BUN 19 8 - 23 mg/dL   Creatinine, Ser 1.14 0.61 - 1.24 mg/dL   Calcium 9.0 8.9 - 10.3 mg/dL   Total Protein 7.5 6.5 - 8.1 g/dL   Albumin 4.2 3.5 - 5.0 g/dL   AST 28 15 - 41 U/L   ALT 28 0 - 44 U/L   Alkaline Phosphatase 62 38 - 126 U/L   Total Bilirubin 0.6 0.3 - 1.2 mg/dL   GFR, Estimated >60 >60 mL/min   Anion gap 8 5 - 15  Urinalysis, Routine w reflex microscopic  Result Value Ref Range   Color, Urine YELLOW (A) YELLOW   APPearance HAZY (A) CLEAR   Specific Gravity, Urine 1.024 1.005 -  1.030   pH 5.0 5.0 - 8.0   Glucose, UA NEGATIVE NEGATIVE mg/dL   Hgb urine dipstick NEGATIVE NEGATIVE   Bilirubin Urine NEGATIVE NEGATIVE   Ketones, ur NEGATIVE NEGATIVE mg/dL   Protein, ur NEGATIVE NEGATIVE mg/dL   Nitrite NEGATIVE NEGATIVE   Leukocytes,Ua NEGATIVE NEGATIVE      Assessment & Plan:   Problem List Items Addressed This Visit   None Visit Diagnoses     Acute right-sided low back pain without sciatica    -  Primary   Will treat with meloxicam and baclofen and stretches. If not getting better by next visit consider x-ray and PT. Call with any concerns.   Relevant Medications   baclofen (LIORESAL) 10 MG tablet   meloxicam (MOBIC) 15 MG tablet        Follow up plan: Return in about 3 months (around 02/14/2022).

## 2021-12-07 ENCOUNTER — Ambulatory Visit: Payer: Medicare Other | Admitting: Family Medicine

## 2021-12-15 ENCOUNTER — Ambulatory Visit (INDEPENDENT_AMBULATORY_CARE_PROVIDER_SITE_OTHER): Payer: Medicare Other | Admitting: Family Medicine

## 2021-12-15 ENCOUNTER — Encounter: Payer: Self-pay | Admitting: Family Medicine

## 2021-12-15 VITALS — BP 148/78 | HR 69 | Temp 98.8°F | Ht 68.0 in | Wt 258.8 lb

## 2021-12-15 DIAGNOSIS — E1122 Type 2 diabetes mellitus with diabetic chronic kidney disease: Secondary | ICD-10-CM

## 2021-12-15 DIAGNOSIS — Z136 Encounter for screening for cardiovascular disorders: Secondary | ICD-10-CM

## 2021-12-15 DIAGNOSIS — R3911 Hesitancy of micturition: Secondary | ICD-10-CM | POA: Diagnosis not present

## 2021-12-15 DIAGNOSIS — Z0000 Encounter for general adult medical examination without abnormal findings: Secondary | ICD-10-CM | POA: Diagnosis not present

## 2021-12-15 DIAGNOSIS — I129 Hypertensive chronic kidney disease with stage 1 through stage 4 chronic kidney disease, or unspecified chronic kidney disease: Secondary | ICD-10-CM

## 2021-12-15 DIAGNOSIS — N181 Chronic kidney disease, stage 1: Secondary | ICD-10-CM

## 2021-12-15 DIAGNOSIS — E7800 Pure hypercholesterolemia, unspecified: Secondary | ICD-10-CM | POA: Diagnosis not present

## 2021-12-15 LAB — URINALYSIS, ROUTINE W REFLEX MICROSCOPIC
Bilirubin, UA: NEGATIVE
Glucose, UA: NEGATIVE
Ketones, UA: NEGATIVE
Leukocytes,UA: NEGATIVE
Nitrite, UA: NEGATIVE
Protein,UA: NEGATIVE
RBC, UA: NEGATIVE
Specific Gravity, UA: 1.02 (ref 1.005–1.030)
Urobilinogen, Ur: 0.2 mg/dL (ref 0.2–1.0)
pH, UA: 6 (ref 5.0–7.5)

## 2021-12-15 LAB — MICROALBUMIN, URINE WAIVED
Creatinine, Urine Waived: 50 mg/dL (ref 10–300)
Microalb, Ur Waived: 30 mg/L — ABNORMAL HIGH (ref 0–19)

## 2021-12-15 LAB — BAYER DCA HB A1C WAIVED: HB A1C (BAYER DCA - WAIVED): 7.1 % — ABNORMAL HIGH (ref 4.8–5.6)

## 2021-12-15 MED ORDER — LOSARTAN POTASSIUM 50 MG PO TABS: 50.0000 mg | ORAL_TABLET | Freq: Every day | ORAL | 0 refills | Status: DC

## 2021-12-15 NOTE — Assessment & Plan Note (Signed)
Under good control on current regimen. Continue current regimen. Continue to monitor. Call with any concerns. Refills given. Labs drawn today.   

## 2021-12-15 NOTE — Assessment & Plan Note (Signed)
Encouraged diet and exercise with goal of losing 1-2lbs per week. Call with any concerns.  

## 2021-12-15 NOTE — Progress Notes (Signed)
BP (!) 148/78   Pulse 69   Temp 98.8 F (37.1 C) (Oral)   Ht '5\' 8"'$  (1.727 m)   Wt 258 lb 12.8 oz (117.4 kg)   SpO2 96%   BMI 39.35 kg/m    Subjective:    Patient ID: Robert Griffith, male    DOB: January 24, 1955, 66 y.o.   MRN: [id number]  HPI: Robert Griffith is a 66 y.o. male presenting on 12/15/2021 for comprehensive medical examination. Current medical complaints include:  DIABETES Hypoglycemic episodes:no Polydipsia/polyuria: no Visual disturbance: no Chest pain: no Paresthesias: no Glucose Monitoring: yes  Accucheck frequency: 159 Taking Insulin?: no Blood Pressure Monitoring: not checking Retinal Examination: Not up to Date Foot Exam: Up to Date Diabetic Education: Completed Pneumovax: Up to Date Influenza: Up to Date Aspirin: no  HYPERTENSION / [place] Satisfied with current treatment? yes Duration of hypertension: chronic BP monitoring frequency: not checking BP medication side effects: no Past BP meds: losartan Duration of hyperlipidemia: chronic Cholesterol medication side effects: no Cholesterol supplements: none Past cholesterol medications: crestor Medication compliance: excellent compliance Aspirin: no Recent stressors: no Recurrent headaches: no Visual changes: no Palpitations: no Dyspnea: no Chest pain: no Lower extremity edema: no Dizzy/lightheaded: no   Interim Problems from his last visit: no  Functional Status Survey: Is the patient deaf or have difficulty hearing?: No Does the patient have difficulty seeing, even when wearing glasses/contacts?: No Does the patient have difficulty concentrating, remembering, or making decisions?: Yes Does the patient have difficulty walking or climbing stairs?: No Does the patient have difficulty dressing or bathing?: No Does the patient have difficulty doing errands alone such as visiting a doctor's office or shopping?: No  FALL RISK:    12/15/2021    8:51 AM 08/01/2021    2:19 PM  01/11/2021    3:52 PM 09/28/2020    3:50 PM 07/18/2020    3:47 PM  [place] in the past year? 0 0 0 0 0  Number falls in past yr: 0 0 0 0 0  Injury with Fall? 0 0 0 0 0  Risk for fall due to :  No Fall Risks No Fall Risks No Fall Risks No Fall Risks  Follow up  Falls evaluation completed Falls evaluation completed Falls evaluation completed Falls evaluation completed    Depression Screen    12/15/2021    8:51 AM 08/01/2021    2:20 PM 01/11/2021    3:51 PM 09/28/2020    3:50 PM 07/18/2020    3:48 PM  Depression screen PHQ 2/9  Decreased Interest 0 0 0 0 0  Down, Depressed, Hopeless 0 0 0 0 0  PHQ - 2 Score 0 0 0 0 0  Altered sleeping 0 0 0    Tired, decreased energy 0 0 0    Change in appetite 0 0 0    Feeling bad or failure about yourself  0 0 0    Trouble concentrating 0 0 0    Moving slowly or fidgety/restless 0 0 0    Suicidal thoughts 0 0 0    PHQ-9 Score 0 0 0    Difficult doing work/chores Not difficult at all Not difficult at all      Advanced Directives Has- will bring in  Past Medical History:  Past Medical History:  Diagnosis Date   Diabetes mellitus without complication ([place])    Hyperlipidemia    Hypertension    Kidney stone    Obesity  Restless legs    Wears dentures    Has full upper and lower, only wears upper    Surgical History:  Past Surgical History:  Procedure Laterality Date   COLONOSCOPY N/A 09/12/2020   Procedure: COLONOSCOPY;  Surgeon: Lucilla Lame, MD;  Location: [facility];  Service: Endoscopy;  Laterality: N/A;  Diabetic   left toe surgery     POLYPECTOMY N/A 09/12/2020   Procedure: POLYPECTOMY;  Surgeon: Lucilla Lame, MD;  Location: [facility];  Service: Endoscopy;  Laterality: N/A;   sleep apnea surgery     TONSILLECTOMY AND ADENOIDECTOMY      Medications:  Current Outpatient Medications on File Prior to Visit  Medication Sig   aspirin 81 MG EC tablet Take 81 mg by mouth daily.     baclofen (LIORESAL) 10  MG tablet Take 0.5-1 tablets (5-10 mg total) by mouth at bedtime as needed for muscle spasms.   glucose blood test strip Use as instructed   meloxicam (MOBIC) 15 MG tablet Take 1 tablet (15 mg total) by mouth daily.   metFORMIN (GLUCOPHAGE) 500 MG tablet Take 2 tablets (1,000 mg total) by mouth 2 (two) times daily with a meal.   montelukast (SINGULAIR) 10 MG tablet TAKE 1 TABLET BY MOUTH EVERYDAY AT BEDTIME   ONE TOUCH ULTRA TEST test strip USE TO TEST BLOOD SUGAR DAILY (EX E11.9)   rosuvastatin (CRESTOR) 40 MG tablet Take 1 tablet (40 mg total) by mouth daily.   No current facility-administered medications on file prior to visit.    Allergies:  Allergies  Allergen Reactions   Other     Bananas: indigestion    Social History:  Social History   Socioeconomic History   Marital status: Married    Spouse name: Not on file   Number of children: Not on file   Years of education: Not on file   Highest education level: Not on file  Occupational History   Not on file  Tobacco Use   Smoking status: Never   Smokeless tobacco: Never  Vaping Use   Vaping Use: Never used  Substance and Sexual Activity   Alcohol use: No   Drug use: No   Sexual activity: Not on file  Other Topics Concern   Not on file  Social History Narrative   Not on file   Social Determinants of Health   Financial Resource Strain: Not on file  Food Insecurity: Not on file  Transportation Needs: Not on file  Physical Activity: Not on file  Stress: Not on file  Social Connections: Not on file  Intimate Partner Violence: Not on file   Social History   Tobacco Use  Smoking Status Never  Smokeless Tobacco Never   Social History   Substance and Sexual Activity  Alcohol Use No    Family History:  Family History  Problem Relation Age of Onset   Heart attack Mother    Heart disease Mother    Heart disease Father    Stroke Brother    Coronary artery disease Other        4 family members    Past  medical history, surgical history, medications, allergies, family history and social history reviewed with patient today and changes made to appropriate areas of the chart.   Review of Systems  Constitutional: Negative.   HENT: Negative.    Eyes: Negative.   Respiratory:  Positive for cough. Negative for hemoptysis, sputum production, shortness of breath and wheezing.   Cardiovascular: Negative.  Gastrointestinal: Negative.   Genitourinary:  Positive for frequency and urgency. Negative for dysuria, flank pain and hematuria.  Musculoskeletal: Negative.   Skin: Negative.   Neurological: Negative.   Endo/Heme/Allergies: Negative.   Psychiatric/Behavioral: Negative.     All other ROS negative except what is listed above and in the HPI.      Objective:    BP (!) 148/78   Pulse 69   Temp 98.8 F (37.1 C) (Oral)   Ht '5\' 8"'$  (1.727 m)   Wt 258 lb 12.8 oz (117.4 kg)   SpO2 96%   BMI 39.35 kg/m   Wt Readings from Last 3 Encounters:  12/15/21 258 lb 12.8 oz (117.4 kg)  11/14/21 259 lb 4.8 oz (117.6 kg)  11/11/21 250 lb (113.4 kg)    Hearing Screening   '500Hz'$  '1000Hz'$  '2000Hz'$  '4000Hz'$   Right ear Pass Pass Pass Fail  Left ear Pass Pass Pass Fail   Vision Screening   Right eye Left eye Both eyes  Without correction     With correction '20/25 20/25 20/15 '$    Physical Exam Vitals and nursing note reviewed.  Constitutional:      General: He is not in acute distress.    Appearance: Normal appearance. He is obese. He is not ill-appearing, toxic-appearing or diaphoretic.  HENT:     Head: Normocephalic and atraumatic.     Right Ear: Tympanic membrane, ear canal and external ear normal. There is no impacted cerumen.     Left Ear: Tympanic membrane, ear canal and external ear normal. There is no impacted cerumen.     Nose: Nose normal. No congestion or rhinorrhea.     Mouth/Throat:     Mouth: Mucous membranes are moist.     Pharynx: Oropharynx is clear. No oropharyngeal exudate or  posterior oropharyngeal erythema.  Eyes:     General: No scleral icterus.       Right eye: No discharge.        Left eye: No discharge.     Extraocular Movements: Extraocular movements intact.     Conjunctiva/sclera: Conjunctivae normal.     Pupils: Pupils are equal, round, and reactive to light.  Neck:     Vascular: No carotid bruit.  Cardiovascular:     Rate and Rhythm: Normal rate and regular rhythm.     Pulses: Normal pulses.     Heart sounds: No murmur heard.    No friction rub. No gallop.  Pulmonary:     Effort: Pulmonary effort is normal. No respiratory distress.     Breath sounds: Normal breath sounds. No stridor. No wheezing, rhonchi or rales.  Chest:     Chest wall: No tenderness.  Abdominal:     General: Abdomen is flat. Bowel sounds are normal. There is no distension.     Palpations: Abdomen is soft. There is no mass.     Tenderness: There is no abdominal tenderness. There is no right CVA tenderness, left CVA tenderness, guarding or rebound.     Hernia: No hernia is present.  Genitourinary:    Comments: Genital exam deferred with shared decision making Musculoskeletal:        General: No swelling, tenderness, deformity or signs of injury.     Cervical back: Normal range of motion and neck supple. No rigidity. No muscular tenderness.     Right lower leg: No edema.     Left lower leg: No edema.  Lymphadenopathy:     Cervical: No cervical adenopathy.  Skin:  General: Skin is warm and dry.     Capillary Refill: Capillary refill takes less than 2 seconds.     Coloration: Skin is not jaundiced or pale.     Findings: No bruising, erythema, lesion or rash.  Neurological:     General: No focal deficit present.     Mental Status: He is alert and oriented to person, place, and time.     Cranial Nerves: No cranial nerve deficit.     Sensory: No sensory deficit.     Motor: No weakness.     Coordination: Coordination normal.     Gait: Gait normal.     Deep Tendon  Reflexes: Reflexes normal.  Psychiatric:        Mood and Affect: Mood normal.        Behavior: Behavior normal.        Thought Content: Thought content normal.        Judgment: Judgment normal.        12/15/2021    8:38 AM  6CIT Screen  What Year? 0 points  What month? 0 points  What time? 0 points  Count back from 20 0 points  Months in reverse 0 points  Repeat phrase 0 points  Total Score 0 points     Results for orders placed or performed during the hospital encounter of 11/12/21  CBC with Differential  Result Value Ref Range   WBC 10.4 4.0 - 10.5 K/uL   RBC 4.78 4.22 - 5.81 MIL/uL   Hemoglobin 14.1 13.0 - 17.0 g/dL   HCT 41.9 39.0 - 52.0 %   MCV 87.7 80.0 - 100.0 fL   MCH 29.5 26.0 - 34.0 pg   MCHC 33.7 30.0 - 36.0 g/dL   RDW 12.5 11.5 - 15.5 %   Platelets 197 150 - 400 K/uL   nRBC 0.0 0.0 - 0.2 %   Neutrophils Relative % 63 %   Neutro Abs 6.6 1.7 - 7.7 K/uL   Lymphocytes Relative 23 %   Lymphs Abs 2.4 0.7 - 4.0 K/uL   Monocytes Relative 9 %   Monocytes Absolute 0.9 0.1 - 1.0 K/uL   Eosinophils Relative 4 %   Eosinophils Absolute 0.4 0.0 - 0.5 K/uL   Basophils Relative 1 %   Basophils Absolute 0.1 0.0 - 0.1 K/uL   Immature Granulocytes 0 %   Abs Immature Granulocytes 0.03 0.00 - 0.07 K/uL  Comprehensive metabolic panel  Result Value Ref Range   Sodium 140 135 - 145 mmol/L   Potassium 3.9 3.5 - 5.1 mmol/L   Chloride 106 98 - 111 mmol/L   CO2 26 22 - 32 mmol/L   Glucose, Bld 162 (H) 70 - 99 mg/dL   BUN 19 8 - 23 mg/dL   Creatinine, Ser 1.14 0.61 - 1.24 mg/dL   Calcium 9.0 8.9 - 10.3 mg/dL   Total Protein 7.5 6.5 - 8.1 g/dL   Albumin 4.2 3.5 - 5.0 g/dL   AST 28 15 - 41 U/L   ALT 28 0 - 44 U/L   Alkaline Phosphatase 62 38 - 126 U/L   Total Bilirubin 0.6 0.3 - 1.2 mg/dL   GFR, Estimated >60 >60 mL/min   Anion gap 8 5 - 15  Urinalysis, Routine w reflex microscopic  Result Value Ref Range   Color, Urine YELLOW (A) YELLOW   APPearance HAZY (A) CLEAR    Specific Gravity, Urine 1.024 1.005 - 1.030   pH 5.0 5.0 - 8.0   Glucose,  UA NEGATIVE NEGATIVE mg/dL   Hgb urine dipstick NEGATIVE NEGATIVE   Bilirubin Urine NEGATIVE NEGATIVE   Ketones, ur NEGATIVE NEGATIVE mg/dL   Protein, ur NEGATIVE NEGATIVE mg/dL   Nitrite NEGATIVE NEGATIVE   Leukocytes,Ua NEGATIVE NEGATIVE      Assessment & Plan:   Problem List Items Addressed This Visit       Endocrine   Type 2 diabetes mellitus with stage 1 chronic kidney disease, without long-term current use of insulin (HCC)    Up slightly with A1c of 7.1. Will watch diet and recheck 3 months. Call with any concerns. Continue to monitor.       Relevant Medications   losartan (COZAAR) 50 MG tablet   Other Relevant Orders   CBC with Differential/Platelet   TSH   Urinalysis, Routine w reflex microscopic   Microalbumin, Urine Waived   Bayer DCA Hb A1c Waived     Genitourinary   Benign hypertensive renal disease    Running high. Will increase losartan to '50mg'$  and recheck 3 months. Call with any concerns.         Other   Hypercholesteremia    Under good control on current regimen. Continue current regimen. Continue to monitor. Call with any concerns. Refills given.  Labs drawn today.      Relevant Medications   losartan (COZAAR) 50 MG tablet   Other Relevant Orders   Comprehensive metabolic panel   CBC with Differential/Platelet   Lipid Panel w/o Chol/HDL Ratio   Morbid obesity (HCC)    Encouraged diet and exercise with goal of losing 1-2lbs per week. Call with any concerns.       Other Visit Diagnoses     Encounter for initial annual wellness visit in Medicare patient    -  Primary   Preventative care discussed today as below.   Hesitancy       Labs drawn today. Await results.   Relevant Orders   PSA   Screening for cardiovascular condition       EKG normal. Continue diet and exercise. Will keep BP and cholesterol under good control. Call with any concerns.   Relevant Orders   EKG  12-Lead (Completed)        Preventative Services:  Health Risk Assessment and Personalized Prevention Plan: Done today Bone Mass Measurements: N/A CVD Screening: Done today Colon Cancer Screening: Up to date Depression Screening: Done today Diabetes Screening: Done today Glaucoma Screening: See your eye doctor Hepatitis B vaccine: N/A Hepatitis C screening: up to date HIV Screening: Up to date Flu Vaccine: up to date Lung cancer Screening: N/A Obesity Screening: Done today Pneumonia Vaccines (2): Up to date STI Screening: N/A PSA screening: Done today   LABORATORY TESTING:  Health maintenance labs ordered today as discussed above.   The natural history of prostate cancer and ongoing controversy regarding screening and potential treatment outcomes of prostate cancer has been discussed with the patient. The meaning of a false positive PSA and a false negative PSA has been discussed. He indicates understanding of the limitations of this screening test and wishes to proceed with screening PSA testing.   IMMUNIZATIONS:   - Tdap: Tetanus vaccination status reviewed: last tetanus booster within 10 years. - Influenza: Up to date - Pneumovax: Up to date - Prevnar: Up to date - Zostavax vaccine: Up to date  SCREENING: - Colonoscopy: Up to date  Discussed with patient purpose of the colonoscopy is to detect colon cancer at curable precancerous or early stages  PATIENT COUNSELING:    Sexuality: Discussed sexually transmitted diseases, partner selection, use of condoms, avoidance of unintended pregnancy  and contraceptive alternatives.   Advised to avoid cigarette smoking.  I discussed with the patient that most people either abstain from alcohol or drink within safe limits (<=14/week and <=4 drinks/occasion for males, <=7/weeks and <= 3 drinks/occasion for females) and that the risk for alcohol disorders and other health effects rises proportionally with the number of drinks per  week and how often a drinker exceeds daily limits.  Discussed cessation/primary prevention of drug use and availability of treatment for abuse.   Diet: Encouraged to adjust caloric intake to maintain  or achieve ideal body weight, to reduce intake of dietary saturated fat and total fat, to limit sodium intake by avoiding high sodium foods and not adding table salt, and to maintain adequate dietary potassium and calcium preferably from fresh fruits, vegetables, and low-fat dairy products.    stressed the importance of regular exercise  Injury prevention: Discussed safety belts, safety helmets, smoke detector, smoking near bedding or upholstery.   Dental health: Discussed importance of regular tooth brushing, flossing, and dental visits.   Follow up plan: NEXT PREVENTATIVE PHYSICAL DUE IN 1 YEAR. Return in about 3 months (around 03/16/2022).

## 2021-12-15 NOTE — Assessment & Plan Note (Signed)
Running high. Will increase losartan to '50mg'$  and recheck 3 months. Call with any concerns.

## 2021-12-15 NOTE — Assessment & Plan Note (Signed)
Up slightly with A1c of 7.1. Will watch diet and recheck 3 months. Call with any concerns. Continue to monitor.

## 2021-12-15 NOTE — Patient Instructions (Signed)
Preventative Services:  Health Risk Assessment and Personalized Prevention Plan: Done today Bone Mass Measurements: N/A CVD Screening: Done today Colon Cancer Screening: Up to date Depression Screening: Done today Diabetes Screening: Done today Glaucoma Screening: See your eye doctor Hepatitis B vaccine: N/A Hepatitis C screening: up to date HIV Screening: Up to date Flu Vaccine: up to date Lung cancer Screening: N/A Obesity Screening: Done today Pneumonia Vaccines (2): Up to date STI Screening: N/A PSA screening: Done today

## 2021-12-16 LAB — CBC WITH DIFFERENTIAL/PLATELET
Basophils Absolute: 0.1 10*3/uL (ref 0.0–0.2)
Basos: 1 %
EOS (ABSOLUTE): 0.3 10*3/uL (ref 0.0–0.4)
Eos: 6 %
Hematocrit: 43 % (ref 37.5–51.0)
Hemoglobin: 14.6 g/dL (ref 13.0–17.7)
Immature Grans (Abs): 0 10*3/uL (ref 0.0–0.1)
Immature Granulocytes: 0 %
Lymphocytes Absolute: 1.5 10*3/uL (ref 0.7–3.1)
Lymphs: 29 %
MCH: 30.3 pg (ref 26.6–33.0)
MCHC: 34 g/dL (ref 31.5–35.7)
MCV: 89 fL (ref 79–97)
Monocytes Absolute: 0.4 10*3/uL (ref 0.1–0.9)
Monocytes: 7 %
Neutrophils Absolute: 3.1 10*3/uL (ref 1.4–7.0)
Neutrophils: 57 %
Platelets: 177 10*3/uL (ref 150–450)
RBC: 4.82 x10E6/uL (ref 4.14–5.80)
RDW: 13.1 % (ref 11.6–15.4)
WBC: 5.4 10*3/uL (ref 3.4–10.8)

## 2021-12-16 LAB — COMPREHENSIVE METABOLIC PANEL
ALT: 32 IU/L (ref 0–44)
AST: 32 IU/L (ref 0–40)
Albumin/Globulin Ratio: 2.1 (ref 1.2–2.2)
Albumin: 4.4 g/dL (ref 3.9–4.9)
Alkaline Phosphatase: 67 IU/L (ref 44–121)
BUN/Creatinine Ratio: 16 (ref 10–24)
BUN: 13 mg/dL (ref 8–27)
Bilirubin Total: 0.4 mg/dL (ref 0.0–1.2)
CO2: 19 mmol/L — ABNORMAL LOW (ref 20–29)
Calcium: 9.5 mg/dL (ref 8.6–10.2)
Chloride: 103 mmol/L (ref 96–106)
Creatinine, Ser: 0.8 mg/dL (ref 0.76–1.27)
Globulin, Total: 2.1 g/dL (ref 1.5–4.5)
Glucose: 201 mg/dL — ABNORMAL HIGH (ref 70–99)
Potassium: 4.4 mmol/L (ref 3.5–5.2)
Sodium: 142 mmol/L (ref 134–144)
Total Protein: 6.5 g/dL (ref 6.0–8.5)
eGFR: 97 mL/min/{1.73_m2} (ref >59)

## 2021-12-16 LAB — LIPID PANEL W/O CHOL/HDL RATIO
Cholesterol, Total: 101 mg/dL (ref 100–199)
HDL: 46 mg/dL (ref >39)
LDL Chol Calc (NIH): 36 mg/dL (ref 0–99)
Triglycerides: 100 mg/dL (ref 0–149)
VLDL Cholesterol Cal: 19 mg/dL (ref 5–40)

## 2021-12-16 LAB — TSH: TSH: 3.25 u[IU]/mL (ref 0.450–4.500)

## 2021-12-16 LAB — PSA: Prostate Specific Ag, Serum: 1.2 ng/mL (ref 0.0–4.0)

## 2021-12-22 NOTE — Progress Notes (Signed)
Interpreted by me on 12/15/21. NSR at 70bpm, no ST segment changes

## 2022-02-03 ENCOUNTER — Other Ambulatory Visit: Payer: Self-pay | Admitting: Family Medicine

## 2022-02-05 NOTE — Telephone Encounter (Signed)
Future visit in 1 month. Requested Prescriptions  Pending Prescriptions Disp Refills   baclofen (LIORESAL) 10 MG tablet [Pharmacy Med Name: BACLOFEN 10 MG TABLET] 30 tablet 0    Sig: TAKE 1/2 TO 1 TABLET (5-10 MG TOTAL) BY MOUTH EVERY DAY AT BEDTIME AS NEEDED FOR MUSCLE SPASM     Analgesics:  Muscle Relaxants - baclofen Passed - 02/03/2022 11:34 AM      Passed - Cr in normal range and within 180 days    Creatinine  Date Value Ref Range Status  09/18/2012 0.78 0.60 - 1.30 mg/dL Final   Creatinine, Ser  Date Value Ref Range Status  12/15/2021 0.80 0.76 - 1.27 mg/dL Final         Passed - eGFR is 30 or above and within 180 days    EGFR (African American)  Date Value Ref Range Status  09/18/2012 >60  Final   GFR calc Af Amer  Date Value Ref Range Status  11/30/2019 107 >59 mL/min/1.73 Final    Comment:    **In accordance with recommendations from the NKF-ASN Task force,**   [facility] is in the process of updating its eGFR calculation to the   2021 CKD-EPI creatinine equation that estimates kidney function   without a race variable.    EGFR (Non-African Amer.)  Date Value Ref Range Status  09/18/2012 >60  Final    Comment:    eGFR values <52m/min/1.73 m2 may be an indication of chronic kidney disease (CKD). Calculated eGFR is useful in patients with stable renal function. The eGFR calculation will not be reliable in acutely ill patients when serum creatinine is changing rapidly. It is not useful in  patients on dialysis. The eGFR calculation may not be applicable to patients at the low and high extremes of body sizes, pregnant women, and vegetarians.    GFR, Estimated  Date Value Ref Range Status  11/11/2021 >60 >60 mL/min Final    Comment:    (NOTE) Calculated using the CKD-EPI Creatinine Equation (2021)    eGFR  Date Value Ref Range Status  12/15/2021 97 >59 mL/min/1.73 Final         Passed - Valid encounter within last 6 months    Recent Outpatient Visits            1 month ago Encounter for initial annual wellness visit in Medicare patient   [facility] [place] [place]   2 months ago Acute right-sided low back pain without sciatica   [facility]   5 months ago Type 2 diabetes mellitus with stage 1 chronic kidney disease, without long-term current use of insulin [place]   [facility] [place] [place]   6 months ago Irritant contact dermatitis due to plants, except food   [facility]   1 year ago Hyperlipidemia, unspecified hyperlipidemia type   [facility], MD       Future Appointments             In 1 month Johnson, MBarb Merino DO [facility]

## 2022-02-09 ENCOUNTER — Ambulatory Visit: Payer: Self-pay | Admitting: *Deleted

## 2022-02-09 NOTE — Telephone Encounter (Signed)
  Chief Complaint: neck pain and stiffness, muscle aches  Symptoms: back left shoulder pain. Left side neck pain and stiffness. Lifting overhead at work and started having muscle pain  Frequency: couple of weeks  Pertinent Negatives: Patient denies no fever, no constant pain no neck swelling. No headache  Disposition: '[]'$ ED /'[x]'$ Urgent Care (no appt availability in office) / '[]'$ Appointment(In office/virtual)/ '[]'$  [facility]/ '[]'$ Home Care/ '[]'$ Refused Recommended Disposition /'[]'$ [facility]/ '[]'$  Follow-up with PCP Additional Notes:   No available appt until 02/23/22. Recommended UC if pain worsens. Patient would like appt earlier if possible and a call back.     Reason for Disposition  [1] MODERATE neck pain (e.g., interferes with normal activities) AND [2] present > 3 days  Answer Assessment - Initial Assessment Questions 1. ONSET: "When did the pain begin?"      Couple of weeks ago  2. LOCATION: "Where does it hurt?"      Back left shoulder and neck area 3. PATTERN "Does the pain come and go, or has it been constant since it started?"      Comes and goes  4. SEVERITY: "How bad is the pain?"  (Scale 1-10; or mild, moderate, severe)   - NO PAIN (0): no pain or only slight stiffness    - MILD (1-3): doesn't interfere with normal activities    - MODERATE (4-7): interferes with normal activities or awakens from sleep    - SEVERE (8-10):  excruciating pain, unable to do any normal activities      Na  5. RADIATION: "Does the pain go anywhere else, shoot into your arms?"     No  6. CORD SYMPTOMS: "Any weakness or numbness of the arms or legs?"     No  7. CAUSE: "What do you think is causing the neck pain?"     Did a lot of work and lifting over his head and noted muscle pain and neck stiffness now  8. NECK OVERUSE: "Any recent activities that involved turning or twisting the neck?"      Working over head   9. OTHER SYMPTOMS: "Do you have any other symptoms?" (e.g., headache,  fever, chest pain, difficulty breathing, neck swelling)     Neck stiffness  back and left shoulder pain and soreness  10. PREGNANCY: "Is there any chance you are pregnant?" "When was your last menstrual period?"       na  Protocols used: Neck Pain or Stiffness-A-AH

## 2022-02-11 NOTE — Patient Instructions (Signed)
Cervical Radiculopathy  Cervical radiculopathy means that a nerve in the neck (a cervical nerve) is pinched or bruised. This can happen because of an injury to the cervical spine (vertebrae) in the neck, or as a normal part of getting older. This condition can cause pain or loss of feeling (numbness) that runs from your neck all the way down to your arm and fingers. Often, this condition gets better with rest. Treatment may be needed if the condition does not get better. What are the causes? A neck injury. A bulging disk in your spine. Sudden muscle tightening (muscle spasms). Tight muscles in your neck due to overuse. Arthritis. Breakdown in the bones and joints of the spine (spondylosis) due to getting older. Bone spurs that form near the nerves in the neck. What are the signs or symptoms? Pain. The pain may: Run from the neck to the arm and hand. Be very bad or irritating. Get worse when you move your neck. Loss of feeling or tingling in your arm or hand. Weakness in your arm or hand, in very bad cases. How is this treated? In many cases, treatment is not needed for this condition. With rest, the condition often gets better over time. If treatment is needed, options may include: Wearing a soft neck collar (cervical collar) for short periods of time. Doing exercises (physical therapy) to strengthen your neck muscles. Taking medicines. Having shots (injections) in your spine, in very bad cases. Having surgery. This may be needed if other treatments do not help. The type of surgery that is used will depend on the cause of your condition. Follow these instructions at home: If you have a soft neck collar: Wear it as told by your doctor. Take it off only as told by your doctor. Ask your doctor if you can take the collar off for cleaning and bathing. If you are allowed to take the collar off for cleaning or bathing: Follow instructions from your doctor about how to take off the collar  safely. Clean the collar by wiping it with mild soap and water and drying it completely. Take out any removable pads in the collar every 1-2 days. Wash them by hand with soap and water. Let them air-dry completely before you put them back in the collar. Check your skin under the collar for redness or sores. If you see any, tell your doctor. Managing pain     Take over-the-counter and prescription medicines only as told by your doctor. If told, put ice on the painful area. To do this: If you have a soft neck collar, take if off as told by your doctor. Put ice in a plastic bag. Place a towel between your skin and the bag. Leave the ice on for 20 minutes, 2-3 times a day. Take off the ice if your skin turns bright red. This is very important. If you cannot feel pain, heat, or cold, you have a greater risk of damage to the area. If using ice does not help, you can try using heat. Use the heat source that your doctor recommends, such as a moist heat pack or a heating pad. Place a towel between your skin and the heat source. Leave the heat on for 20-30 minutes. Take off the heat if your skin turns bright red. This is very important. If you cannot feel pain, heat, or cold, you have a greater risk of getting burned. You may try a gentle neck and shoulder rub (massage). Activity Rest as needed. Return  to your normal activities when your doctor says that it is safe. Do exercises as told by your doctor or physical therapist. You may have to avoid lifting. Ask your doctor how much you can safely lift. General instructions Use a flat pillow when you sleep. Do not drive while wearing a soft neck collar. If you do not have a soft neck collar, ask your doctor if it is safe to drive while your neck heals. Ask your doctor if you should avoid driving or using machines while you are taking your medicine. Do not smoke or use any products that contain nicotine or tobacco. If you need help quitting, ask your  doctor. Keep all follow-up visits. Contact a doctor if: Your condition does not get better with treatment. Get help right away if: Your pain gets worse and medicine does not help. You lose feeling or feel weak in your hand, arm, face, or leg. You have a high fever. Your neck is stiff. You cannot control when you poop or pee (have incontinence). You have trouble with walking, balance, or talking. Summary Cervical radiculopathy means that a nerve in the neck is pinched or bruised. A nerve can get pinched from a bulging disk, arthritis, an injury to the neck, or other causes. Symptoms include pain, tingling, or loss of feeling that goes from the neck to the arm or hand. Weakness in your arm or hand can happen in very bad cases. Treatment may include resting, wearing a soft neck collar, and doing exercises. You might need to take medicines for pain. In very bad cases, shots or surgery may be needed. This information is not intended to replace advice given to you by your health care provider. Make sure you discuss any questions you have with your health care provider. Document Revised: 06/23/2020 Document Reviewed: 06/23/2020 Elsevier Patient Education  [place].

## 2022-02-12 ENCOUNTER — Encounter: Payer: Self-pay | Admitting: Nurse Practitioner

## 2022-02-12 ENCOUNTER — Ambulatory Visit (INDEPENDENT_AMBULATORY_CARE_PROVIDER_SITE_OTHER): Payer: HMO | Admitting: Nurse Practitioner

## 2022-02-12 VITALS — BP 156/94 | HR 80 | Temp 98.8°F | Ht 67.99 in | Wt 256.9 lb

## 2022-02-12 DIAGNOSIS — M542 Cervicalgia: Secondary | ICD-10-CM

## 2022-02-12 MED ORDER — LIDOCAINE 5 % EX PTCH: 1.0000 | MEDICATED_PATCH | CUTANEOUS | 0 refills | Status: DC

## 2022-02-12 MED ORDER — BACLOFEN 10 MG PO TABS: ORAL_TABLET | ORAL | 0 refills | Status: DC

## 2022-02-12 MED ORDER — HYDROCODONE-ACETAMINOPHEN 5-325 MG PO TABS: 1.0000 | ORAL_TABLET | Freq: Four times a day (QID) | ORAL | 0 refills | Status: DC | PRN

## 2022-02-12 NOTE — Assessment & Plan Note (Signed)
Acute, started about one month ago.  No red flags.  Will avoid steroids due to diabetes as recent A1c mildly above goal at 7.1% and he is trying to get back down.  At this time recommend he increase Baclofen to 10 MG BID for one week + start using heating pad 4-5 times a day on for 15 minutes then off.  Gentle stretches at home, but no heavy lifting.  Will send in Lidocaine patches 5% and short burst of Norco for pain (5 days).  To return in one week for follow-up and if no improvement will consider imaging and possible PT referral.

## 2022-02-12 NOTE — Progress Notes (Signed)
BP (!) 156/94   Pulse 80   Temp 98.8 F (37.1 C) (Oral)   Ht 5' 7.99" (1.727 m)   Wt 256 lb 14.4 oz (116.5 kg)   SpO2 96%   BMI 39.07 kg/m    Subjective:    Patient ID: Robert Griffith, male    DOB: [birth year], 67 y.o.   MRN: [id number]  HPI: Robert Griffith is a 67 y.o. male  Chief Complaint  Patient presents with   Back Pain    Upper back pain and left shoulder for past month   NECK PAIN  Presents today for neck and shoulder pain, started about one month ago.  Was working overhead and doing lifting, was helping someone out.   Treatments attempted: hot water in shower, Tylenol, Ibuprofen, Meloxicam  Relief with NSAIDs?:  no Location:Left and midline Duration:weeks Severity: 7/10 at worst Quality: sharp, aching, and throbbing Frequency: constant Radiation: L arm at times Aggravating factors: lifting and movement, turning head to left, looking up, or lying on it Alleviating factors: nothing Weakness:  no Paresthesias / decreased sensation:  no  Fevers:  no   Relevant past medical, surgical, family and social history reviewed and updated as indicated. Interim medical history since our last visit reviewed. Allergies and medications reviewed and updated.  Review of Systems  Constitutional:  Negative for activity change, appetite change, diaphoresis, fatigue and fever.  Respiratory: Negative.    Cardiovascular: Negative.   Musculoskeletal:  Positive for neck pain. Negative for neck stiffness.  Neurological: Negative.   Psychiatric/Behavioral: Negative.      Per HPI unless specifically indicated above     Objective:    BP (!) 156/94   Pulse 80   Temp 98.8 F (37.1 C) (Oral)   Ht 5' 7.99" (1.727 m)   Wt 256 lb 14.4 oz (116.5 kg)   SpO2 96%   BMI 39.07 kg/m   Wt Readings from Last 3 Encounters:  02/12/22 256 lb 14.4 oz (116.5 kg)  12/15/21 258 lb 12.8 oz (117.4 kg)  11/14/21 259 lb 4.8 oz (117.6 kg)    Physical Exam Vitals and nursing note reviewed.   Constitutional:      General: He is awake. He is not in acute distress.    Appearance: He is well-developed and well-groomed. He is obese. He is not ill-appearing or toxic-appearing.  HENT:     Head: Normocephalic.     Right Ear: Hearing and external ear normal.     Left Ear: Hearing and external ear normal.  Eyes:     General: Lids are normal.     Extraocular Movements: Extraocular movements intact.     Conjunctiva/sclera: Conjunctivae normal.  Neck:     Vascular: No carotid bruit.     Comments: No rashes noted to skin on neck area. Cardiovascular:     Rate and Rhythm: Normal rate and regular rhythm.     Heart sounds: Normal heart sounds.  Pulmonary:     Effort: No accessory muscle usage or respiratory distress.     Breath sounds: Normal breath sounds.  Abdominal:     General: Bowel sounds are normal. There is no distension.     Palpations: Abdomen is soft.     Tenderness: There is no abdominal tenderness.  Musculoskeletal:     Right shoulder: Normal.     Left shoulder: No swelling, tenderness or crepitus. Normal range of motion. Normal strength.     Cervical back: No edema, erythema, rigidity or crepitus. Pain  with movement (with movement to left and extension) and muscular tenderness present. No spinous process tenderness. Decreased range of motion (decreased rotation to left and decreased extension).     Right lower leg: No edema.     Left lower leg: No edema.  Lymphadenopathy:     Cervical: No cervical adenopathy.  Skin:    General: Skin is warm.     Capillary Refill: Capillary refill takes less than 2 seconds.  Neurological:     Mental Status: He is alert and oriented to person, place, and time.     Deep Tendon Reflexes: Reflexes are normal and symmetric.     Reflex Scores:      Brachioradialis reflexes are 2+ on the right side and 2+ on the left side.      Patellar reflexes are 2+ on the right side and 2+ on the left side. Psychiatric:        Attention and  Perception: Attention normal.        Mood and Affect: Mood normal.        Speech: Speech normal.        Behavior: Behavior normal. Behavior is cooperative.        Thought Content: Thought content normal.    Results for orders placed or performed in visit on 12/15/21  Comprehensive metabolic panel  Result Value Ref Range   Glucose 201 (H) 70 - 99 mg/dL   BUN 13 8 - 27 mg/dL   Creatinine, Ser 0.80 0.76 - 1.27 mg/dL   eGFR 97 >59 mL/min/1.73   BUN/Creatinine Ratio 16 10 - 24   Sodium 142 134 - 144 mmol/L   Potassium 4.4 3.5 - 5.2 mmol/L   Chloride 103 96 - 106 mmol/L   CO2 19 (L) 20 - 29 mmol/L   Calcium 9.5 8.6 - 10.2 mg/dL   Total Protein 6.5 6.0 - 8.5 g/dL   Albumin 4.4 3.9 - 4.9 g/dL   Globulin, Total 2.1 1.5 - 4.5 g/dL   Albumin/Globulin Ratio 2.1 1.2 - 2.2   Bilirubin Total 0.4 0.0 - 1.2 mg/dL   Alkaline Phosphatase 67 44 - 121 IU/L   AST 32 0 - 40 IU/L   ALT 32 0 - 44 IU/L  CBC with Differential/Platelet  Result Value Ref Range   WBC 5.4 3.4 - 10.8 x10E3/uL   RBC 4.82 4.14 - 5.80 x10E6/uL   Hemoglobin 14.6 13.0 - 17.7 g/dL   Hematocrit 43.0 37.5 - 51.0 %   MCV 89 79 - 97 fL   MCH 30.3 26.6 - 33.0 pg   MCHC 34.0 31.5 - 35.7 g/dL   RDW 13.1 11.6 - 15.4 %   Platelets 177 150 - 450 x10E3/uL   Neutrophils 57 Not Estab. %   Lymphs 29 Not Estab. %   Monocytes 7 Not Estab. %   Eos 6 Not Estab. %   Basos 1 Not Estab. %   Neutrophils Absolute 3.1 1.4 - 7.0 x10E3/uL   Lymphocytes Absolute 1.5 0.7 - 3.1 x10E3/uL   Monocytes Absolute 0.4 0.1 - 0.9 x10E3/uL   EOS (ABSOLUTE) 0.3 0.0 - 0.4 x10E3/uL   Basophils Absolute 0.1 0.0 - 0.2 x10E3/uL   Immature Granulocytes 0 Not Estab. %   Immature Grans (Abs) 0.0 0.0 - 0.1 x10E3/uL  Lipid Panel w/o Chol/HDL Ratio  Result Value Ref Range   Cholesterol, Total 101 100 - 199 mg/dL   Triglycerides 100 0 - 149 mg/dL   HDL 46 >39 mg/dL   VLDL Cholesterol  Cal 19 5 - 40 mg/dL   LDL Chol Calc (NIH) 36 0 - 99 mg/dL  PSA  Result Value Ref  Range   Prostate Specific Ag, Serum 1.2 0.0 - 4.0 ng/mL  TSH  Result Value Ref Range   TSH 3.250 0.450 - 4.500 uIU/mL  Urinalysis, Routine w reflex microscopic  Result Value Ref Range   Specific Gravity, UA 1.020 1.005 - 1.030   pH, UA 6.0 5.0 - 7.5   Color, UA Yellow Yellow   Appearance Ur Clear Clear   Leukocytes,UA Negative Negative   Protein,UA Negative Negative/Trace   Glucose, UA Negative Negative   Ketones, UA Negative Negative   RBC, UA Negative Negative   Bilirubin, UA Negative Negative   Urobilinogen, Ur 0.2 0.2 - 1.0 mg/dL   Nitrite, UA Negative Negative   Microscopic Examination Comment   Microalbumin, Urine Waived  Result Value Ref Range   Microalb, Ur Waived 30 (H) 0 - 19 mg/L   Creatinine, Urine Waived 50 10 - 300 mg/dL   Microalb/Creat Ratio 30-300 (H) <30 mg/g  Bayer DCA Hb A1c Waived  Result Value Ref Range   HB A1C (BAYER DCA - WAIVED) 7.1 (H) 4.8 - 5.6 %      Assessment & Plan:   Problem List Items Addressed This Visit       Other   Neck pain - Primary    Acute, started about one month ago.  No red flags.  Will avoid steroids due to diabetes as recent A1c mildly above goal at 7.1% and he is trying to get back down.  At this time recommend he increase Baclofen to 10 MG BID for one week + start using heating pad 4-5 times a day on for 15 minutes then off.  Gentle stretches at home, but no heavy lifting.  Will send in Lidocaine patches 5% and short burst of Norco for pain (5 days).  To return in one week for follow-up and if no improvement will consider imaging and possible PT referral.          Follow up plan: Return in about 1 week (around 02/19/2022) for NECK PAIN.

## 2022-02-13 ENCOUNTER — Telehealth: Payer: Self-pay

## 2022-02-13 NOTE — Telephone Encounter (Signed)
PA started for Lidocaine patches through [employer]. Awaiting on determination

## 2022-02-14 MED ORDER — TRAMADOL HCL 50 MG PO TABS: 25.0000 mg | ORAL_TABLET | Freq: Three times a day (TID) | ORAL | 0 refills | Status: AC | PRN

## 2022-02-14 NOTE — Addendum Note (Signed)
Addended by: Marnee Guarneri T on: 02/14/2022 12:28 PM   Modules accepted: Orders

## 2022-02-18 NOTE — Patient Instructions (Signed)
Cervical Sprain A cervical sprain is also called a neck sprain. It is a stretch or tear in one or more ligaments in the neck. Ligaments are tissues that connect bones to each other. Neck sprains can be mild, bad, or very bad. A very bad sprain in the neck can cause the bones in the neck to be unstable. This can damage the spinal cord. It can also cause serious problems in the brain, spinal cord, and nerves (nervous system). Most neck sprains heal in 4-6 weeks. It can take more or less time depending on: What caused the injury. The amount of injury. What are the causes? Neck sprains may be caused by trauma, such as: An injury from an accident in a vehicle such as a car or boat. A fall. The head and neck being moved front to back or side to side all of a sudden (whiplash injury). Mild neck sprains may be caused by wear and tear over time. What increases the risk? The following factors may make you more likely to develop this condition: Taking part in activities that put you at high risk of hurting your neck. These include: Contact sports. Animator. Gymnastics. Diving. Taking risks when driving or riding in a vehicle such as a car or boat. Arthritis caused by wear and tear of the joints in the spine. The neck not being very strong or flexible. Having had a neck injury in the past. Poor posture. Spending a lot of time in certain positions that put stress on the neck. This may be from sitting at a computer for a long time. What are the signs or symptoms? Symptoms of this condition include: Your neck, shoulders, or upper back feeling: Painful or sore. Stiff. Tender. Swollen. Hot, or like it is burning. Sudden tightening of neck muscles (spasms). Not being able to move the neck very much. Headache. Feeling dizzy. Feeling like you may vomit, or vomiting. Having a hand or arm that: Feels weak. Loses feeling (feels numb). Tingles. You may get symptoms right away after injury, or you  may get them over a few days. In some cases, symptoms may go away with treatment and come back over time. How is this treated? This condition is treated by: Resting your neck. Icing the part of your neck that is hurt. Doing exercises to restore movement and strength to your neck (physical therapy). If there is no swelling, you may use heat therapy 2-3 days after the injury took place. If your injury is very bad, treatment may also include: Keeping your neck in place for a length of time. This may be done using: A neck collar. This supports your chin and the back of your head. A cervical traction device. This is a sling that holds up your head. The sling removes weight and pressure from your neck. It may also help to relieve pain. Medicines that help with: Pain. Irritation and swelling (inflammation). Medicines that help to relax your muscles (muscle relaxants). Surgery. This is rare. Follow these instructions at home: Medicines  Take over-the-counter and prescription medicines only as told by your doctor. Ask your doctor if the medicine prescribed to you: Requires you to avoid driving or using heavy machinery. Can cause trouble pooping (constipation). You may need to take these actions to prevent or treat trouble pooping: Drink enough fluid to keep your pee (urine) pale yellow. Take over-the-counter or prescription medicines. Eat foods that are high in fiber. These include beans, whole grains, and fresh fruits and vegetables. Limit  foods that are high in fat and sugar. These include fried or sweet foods. If you have a neck collar: Wear it as told by your doctor. Do not take it off unless told. Ask your doctor before adjusting your collar. If you have long hair, keep it outside of the collar. Ask your doctor if you may take off the collar for cleaning and bathing. If you may take off the collar: Follow instructions about how to take it off safely. Clean it by hand with mild soap and  water. Let it air-dry fully. If your collar has pads that you can take out: Take the pads out every 1-2 days. Wash them by hand with soap and water. Let the pads air-dry fully before you put them back in the collar. Tell your doctor if your skin under the collar has irritation or sores. Managing pain, stiffness, and swelling     Use a cervical traction device, if told by your doctor. If told, put ice on the affected area. To do this: Put ice in a plastic bag. Place a towel between your skin and the bag. Leave the ice on for 20 minutes, 2-3 times a day. If told, put heat on the affected area. Do this before exercise or as often as told by your doctor. Use the heat source that your doctor recommends, such as a moist heat pack or a heating pad. Place a towel between your skin and the heat source. Leave the heat on for 20-30 minutes. Take the heat off if your skin turns bright red. This is very important if you cannot feel pain, heat, or cold. You may have a greater risk of getting burned. Activity Do not drive while wearing a neck collar. If you do not have a neck collar, ask if it is safe to drive while your neck heals. Do not lift anything that is heavier than 10 lb (4.5 kg), or the limit that you are told, until your doctor tells you that it is safe. Rest as told by your doctor. Do exercises as told by your doctor or physical therapist. Return to your normal activities as told by your doctor. Avoid positions and activities that make you feel worse. Ask your doctor what activities are safe for you. General instructions Do not use any products that contain nicotine or tobacco, such as cigarettes, e-cigarettes, and chewing tobacco. These can delay healing. If you need help quitting, ask your doctor. Keep all follow-up visits as told by your doctor or physical therapist. This is important. How is this prevented? To prevent a neck sprain from happening again: Practice good posture. Adjust  your workstation to help you do this. Exercise regularly as told by your doctor or physical therapist. Avoid activities that are risky or may cause a neck sprain. Contact a doctor if: Your symptoms get worse. Your symptoms do not get better after 2 weeks of treatment. Your pain gets worse. Medicine does not help your pain. You have new symptoms that you cannot explain. Your neck collar gives you sores on your skin or bothers your skin. Get help right away if: You have very bad pain. You get any of the following in any part of your body: Loss of feeling. Tingling. Weakness. You cannot move a part of your body. You have neck pain and either of these: Very bad dizziness. A very bad headache. Summary A cervical sprain is also called a neck sprain. It is a stretch or tear in one or more  ligaments in the neck. Ligaments are tissues that connect bones. Neck sprains may be caused by trauma, such as an injury or a fall. You may get symptoms right away after injury, or you may get them over a few days. Neck sprains may be treated with rest, heat, ice, medicines, exercise, and surgery. This information is not intended to replace advice given to you by your health care provider. Make sure you discuss any questions you have with your health care provider. Document Revised: 03/27/2021 Document Reviewed: 08/27/2018 Elsevier Patient Education  [place].

## 2022-02-21 ENCOUNTER — Ambulatory Visit (INDEPENDENT_AMBULATORY_CARE_PROVIDER_SITE_OTHER): Payer: HMO | Admitting: Nurse Practitioner

## 2022-02-21 ENCOUNTER — Encounter: Payer: Self-pay | Admitting: Nurse Practitioner

## 2022-02-21 VITALS — BP 145/74 | HR 78 | Temp 98.4°F | Ht 67.99 in | Wt 255.2 lb

## 2022-02-21 DIAGNOSIS — M542 Cervicalgia: Secondary | ICD-10-CM

## 2022-02-21 NOTE — Assessment & Plan Note (Signed)
Acute and ongoing.  No red flags.  Will avoid steroids due to diabetes as recent A1c mildly above goal at 7.1% and he is trying to get back down.  At this time recommend he continue Baclofen 10 MG BID for one week + trial Tramadol and Lidocaine patches  Gentle stretches at home.  To return in two weeks for follow-up and if no improvement will consider imaging and possible PT referral -- discussed with patient.

## 2022-02-21 NOTE — Progress Notes (Addendum)
BP (!) 145/74   Pulse 78   Temp 98.4 F (36.9 C) (Oral)   Ht 5' 7.99" (1.727 m)   Wt 255 lb 3.2 oz (115.8 kg)   SpO2 96%   BMI 38.81 kg/m    Subjective:    Patient ID: Robert Griffith, male    DOB: 12-25-1955, 67 y.o.   MRN: [id number]  HPI: Robert Griffith is a 67 y.o. male  Chief Complaint  Patient presents with   Neck Pain    Patient states that it seems to be getitng better   NECK PAIN  Follow-up for pain today.  Was provided Baclofen BID and Tramadol to take as needed.  Has used Baclofen, but no difference noted.  Ordered Lidocaine patches, but insurance would not cover -- he is picking up today with coupon.  Has not taken Tramadol.  Neck and shoulder pain, started about one month ago.  Was working overhead and doing lifting, was helping someone out.  Pain currently is not improving.   Treatments attempted: Baclofen, Tylenol, Meloxicam Relief with NSAIDs?:  no Location:Left and midline Duration:weeks Severity: 6-7/10 today Quality: sharp, aching, and throbbing Frequency: constant Radiation: L arm at times Aggravating factors: lifting and movement, turning head to left, looking up, or lying on it Alleviating factors: nothing Weakness:  no Paresthesias / decreased sensation:  no  Fevers:  no   Relevant past medical, surgical, family and social history reviewed and updated as indicated. Interim medical history since our last visit reviewed. Allergies and medications reviewed and updated.  Review of Systems  Constitutional:  Negative for activity change, appetite change, diaphoresis, fatigue and fever.  Respiratory: Negative.    Cardiovascular: Negative.   Musculoskeletal:  Positive for neck pain. Negative for neck stiffness.  Neurological: Negative.   Psychiatric/Behavioral: Negative.      Per HPI unless specifically indicated above     Objective:    BP (!) 145/74   Pulse 78   Temp 98.4 F (36.9 C) (Oral)   Ht 5' 7.99" (1.727 m)   Wt 255 lb 3.2 oz  (115.8 kg)   SpO2 96%   BMI 38.81 kg/m   Wt Readings from Last 3 Encounters:  02/21/22 255 lb 3.2 oz (115.8 kg)  02/12/22 256 lb 14.4 oz (116.5 kg)  12/15/21 258 lb 12.8 oz (117.4 kg)    Physical Exam Vitals and nursing note reviewed.  Constitutional:      General: He is awake. He is not in acute distress.    Appearance: He is well-developed and well-groomed. He is obese. He is not ill-appearing or toxic-appearing.  HENT:     Head: Normocephalic.     Right Ear: Hearing and external ear normal.     Left Ear: Hearing and external ear normal.  Eyes:     General: Lids are normal.     Extraocular Movements: Extraocular movements intact.     Conjunctiva/sclera: Conjunctivae normal.  Neck:     Vascular: No carotid bruit.     Comments: No rashes noted to skin on neck area. Cardiovascular:     Rate and Rhythm: Normal rate and regular rhythm.     Heart sounds: Normal heart sounds.  Pulmonary:     Effort: No accessory muscle usage or respiratory distress.     Breath sounds: Normal breath sounds.  Abdominal:     General: Bowel sounds are normal. There is no distension.     Palpations: Abdomen is soft.     Tenderness: There  is no abdominal tenderness.  Musculoskeletal:     Right shoulder: Normal.     Left shoulder: No swelling, tenderness or crepitus. Normal range of motion. Normal strength.     Cervical back: No edema, erythema, rigidity or crepitus. Pain with movement (with movement to left and extension) and muscular tenderness present. No spinous process tenderness. Decreased range of motion (decreased rotation to left and decreased extension).     Right lower leg: No edema.     Left lower leg: No edema.  Lymphadenopathy:     Cervical: No cervical adenopathy.  Skin:    General: Skin is warm.     Capillary Refill: Capillary refill takes less than 2 seconds.  Neurological:     Mental Status: He is alert and oriented to person, place, and time.     Deep Tendon Reflexes: Reflexes  are normal and symmetric.     Reflex Scores:      Brachioradialis reflexes are 2+ on the right side and 2+ on the left side.      Patellar reflexes are 2+ on the right side and 2+ on the left side. Psychiatric:        Attention and Perception: Attention normal.        Mood and Affect: Mood normal.        Speech: Speech normal.        Behavior: Behavior normal. Behavior is cooperative.        Thought Content: Thought content normal.    Results for orders placed or performed in visit on 12/15/21  Comprehensive metabolic panel  Result Value Ref Range   Glucose 201 (H) 70 - 99 mg/dL   BUN 13 8 - 27 mg/dL   Creatinine, Ser 0.80 0.76 - 1.27 mg/dL   eGFR 97 >59 mL/min/1.73   BUN/Creatinine Ratio 16 10 - 24   Sodium 142 134 - 144 mmol/L   Potassium 4.4 3.5 - 5.2 mmol/L   Chloride 103 96 - 106 mmol/L   CO2 19 (L) 20 - 29 mmol/L   Calcium 9.5 8.6 - 10.2 mg/dL   Total Protein 6.5 6.0 - 8.5 g/dL   Albumin 4.4 3.9 - 4.9 g/dL   Globulin, Total 2.1 1.5 - 4.5 g/dL   Albumin/Globulin Ratio 2.1 1.2 - 2.2   Bilirubin Total 0.4 0.0 - 1.2 mg/dL   Alkaline Phosphatase 67 44 - 121 IU/L   AST 32 0 - 40 IU/L   ALT 32 0 - 44 IU/L  CBC with Differential/Platelet  Result Value Ref Range   WBC 5.4 3.4 - 10.8 x10E3/uL   RBC 4.82 4.14 - 5.80 x10E6/uL   Hemoglobin 14.6 13.0 - 17.7 g/dL   Hematocrit 43.0 37.5 - 51.0 %   MCV 89 79 - 97 fL   MCH 30.3 26.6 - 33.0 pg   MCHC 34.0 31.5 - 35.7 g/dL   RDW 13.1 11.6 - 15.4 %   Platelets 177 150 - 450 x10E3/uL   Neutrophils 57 Not Estab. %   Lymphs 29 Not Estab. %   Monocytes 7 Not Estab. %   Eos 6 Not Estab. %   Basos 1 Not Estab. %   Neutrophils Absolute 3.1 1.4 - 7.0 x10E3/uL   Lymphocytes Absolute 1.5 0.7 - 3.1 x10E3/uL   Monocytes Absolute 0.4 0.1 - 0.9 x10E3/uL   EOS (ABSOLUTE) 0.3 0.0 - 0.4 x10E3/uL   Basophils Absolute 0.1 0.0 - 0.2 x10E3/uL   Immature Granulocytes 0 Not Estab. %   Immature Grans (Abs)  0.0 0.0 - 0.1 x10E3/uL  Lipid Panel w/o  Chol/HDL Ratio  Result Value Ref Range   Cholesterol, Total 101 100 - 199 mg/dL   Triglycerides 100 0 - 149 mg/dL   HDL 46 >39 mg/dL   VLDL Cholesterol Cal 19 5 - 40 mg/dL   LDL Chol Calc (NIH) 36 0 - 99 mg/dL  PSA  Result Value Ref Range   Prostate Specific Ag, Serum 1.2 0.0 - 4.0 ng/mL  TSH  Result Value Ref Range   TSH 3.250 0.450 - 4.500 uIU/mL  Urinalysis, Routine w reflex microscopic  Result Value Ref Range   Specific Gravity, UA 1.020 1.005 - 1.030   pH, UA 6.0 5.0 - 7.5   Color, UA Yellow Yellow   Appearance Ur Clear Clear   Leukocytes,UA Negative Negative   Protein,UA Negative Negative/Trace   Glucose, UA Negative Negative   Ketones, UA Negative Negative   RBC, UA Negative Negative   Bilirubin, UA Negative Negative   Urobilinogen, Ur 0.2 0.2 - 1.0 mg/dL   Nitrite, UA Negative Negative   Microscopic Examination Comment   Microalbumin, Urine Waived  Result Value Ref Range   Microalb, Ur Waived 30 (H) 0 - 19 mg/L   Creatinine, Urine Waived 50 10 - 300 mg/dL   Microalb/Creat Ratio 30-300 (H) <30 mg/g  Bayer DCA Hb A1c Waived  Result Value Ref Range   HB A1C (BAYER DCA - WAIVED) 7.1 (H) 4.8 - 5.6 %      Assessment & Plan:   Problem List Items Addressed This Visit       Other   Neck pain - Primary    Acute and ongoing.  No red flags.  Will avoid steroids due to diabetes as recent A1c mildly above goal at 7.1% and he is trying to get back down.  At this time recommend he continue Baclofen 10 MG BID for one week + trial Tramadol and Lidocaine patches  Gentle stretches at home.  To return in two weeks for follow-up and if no improvement will consider imaging and possible PT referral -- discussed with patient.          Follow up plan: Return in about 2 weeks (around 03/07/2022) for NECK PAIN FOLLOW-UP.

## 2022-02-27 ENCOUNTER — Other Ambulatory Visit: Payer: Self-pay | Admitting: Family Medicine

## 2022-02-27 DIAGNOSIS — E7800 Pure hypercholesterolemia, unspecified: Secondary | ICD-10-CM

## 2022-02-27 NOTE — Telephone Encounter (Signed)
Requested medication (s) are due for refill today: no  Requested medication (s) are on the active medication list: yes  Last refill:  09/07/21 #90 1 RF  Future visit scheduled: yes  Notes to clinic:  overdue lab work    Requested Prescriptions  Pending Prescriptions Disp Refills   rosuvastatin (CRESTOR) 40 MG tablet [Pharmacy Med Name: ROSUVASTATIN CALCIUM 40 MG TAB] 90 tablet     Sig: TAKE 1 TABLET BY MOUTH EVERY DAY     Cardiovascular:  Antilipid - Statins 2 Failed - 02/27/2022  1:47 AM      Failed - Lipid Panel in normal range within the last 12 months    Cholesterol, Total  Date Value Ref Range Status  12/15/2021 101 100 - 199 mg/dL Final   Cholesterol Piccolo, Waived  Date Value Ref Range Status  09/04/2018 120 <200 mg/dL Final    Comment:                            Desirable                <200                         Borderline High      200- 239                         High                     >239    LDL Chol Calc (NIH)  Date Value Ref Range Status  12/15/2021 36 0 - 99 mg/dL Final   HDL  Date Value Ref Range Status  12/15/2021 46 >39 mg/dL Final   Triglycerides  Date Value Ref Range Status  12/15/2021 100 0 - 149 mg/dL Final   Triglycerides Piccolo,Waived  Date Value Ref Range Status  09/04/2018 169 (H) <150 mg/dL Final    Comment:                            Normal                   <150                         Borderline High     150 - 199                         High                200 - 499                         Very High                >499          Passed - Cr in normal range and within 360 days    Creatinine  Date Value Ref Range Status  09/18/2012 0.78 0.60 - 1.30 mg/dL Final   Creatinine, Ser  Date Value Ref Range Status  12/15/2021 0.80 0.76 - 1.27 mg/dL Final         Passed - Patient is not pregnant      Passed - Valid encounter within last 12 months  Recent Outpatient Visits           6 days ago Neck pain   [place], Henrine Screws T, NP   2 weeks ago Neck pain   [place], [place] T, NP   2 months ago Encounter for initial annual wellness visit in Medicare patient   [place] P, DO   3 months ago Acute right-sided low back pain without sciatica   [facility] [place], Megan P, DO   5 months ago Type 2 diabetes mellitus with stage 1 chronic kidney disease, without long-term current use of insulin ([place])   [place], [place], DO       Future Appointments             In 1 week Wynetta Emery, Barb Merino, DO [place], [facility]   In 2 weeks Wynetta Emery, Barb Merino, DO [place], [facility]            Refused Prescriptions Disp Refills   meloxicam (MOBIC) 15 MG tablet [Pharmacy Med Name: MELOXICAM 15 MG TABLET] 90 tablet     Sig: TAKE 1 TABLET (15 MG TOTAL) BY MOUTH DAILY.     Analgesics:  COX2 Inhibitors Failed - 02/27/2022  1:47 AM      Failed - Manual Review: Labs are only required if the patient has taken medication for more than 8 weeks.      Passed - HGB in normal range and within 360 days    Hemoglobin  Date Value Ref Range Status  12/15/2021 14.6 13.0 - 17.7 g/dL Final         Passed - Cr in normal range and within 360 days    Creatinine  Date Value Ref Range Status  09/18/2012 0.78 0.60 - 1.30 mg/dL Final   Creatinine, Ser  Date Value Ref Range Status  12/15/2021 0.80 0.76 - 1.27 mg/dL Final         Passed - HCT in normal range and within 360 days    Hematocrit  Date Value Ref Range Status  12/15/2021 43.0 37.5 - 51.0 % Final         Passed - AST in normal range and within 360 days    AST  Date Value Ref Range Status  12/15/2021 32 0 - 40 IU/L Final   SGOT(AST)  Date Value Ref Range Status  09/15/2012 36 15 - 37 Unit/L Final   AST (SGOT) Piccolo, Waived  Date Value Ref Range Status   09/04/2018 38 11 - 38 U/L Final         Passed - ALT in normal range and within 360 days    ALT  Date Value Ref Range Status  12/15/2021 32 0 - 44 IU/L Final   SGPT (ALT)  Date Value Ref Range Status  09/15/2012 37 12 - 78 U/L Final   ALT (SGPT) Piccolo, Waived  Date Value Ref Range Status  09/04/2018 40 10 - 47 U/L Final         Passed - eGFR is 30 or above and within 360 days    EGFR (African American)  Date Value Ref Range Status  09/18/2012 >60  Final   GFR calc Af Amer  Date Value Ref Range Status  11/30/2019 107 >59 mL/min/1.73 Final    Comment:    **In accordance with recommendations from the NKF-ASN Task  force,**   [facility] is in the process of updating its eGFR calculation to the   2021 CKD-EPI creatinine equation that estimates kidney function   without a race variable.    EGFR (Non-African Amer.)  Date Value Ref Range Status  09/18/2012 >60  Final    Comment:    eGFR values <83m/min/1.73 m2 may be an indication of chronic kidney disease (CKD). Calculated eGFR is useful in patients with stable renal function. The eGFR calculation will not be reliable in acutely ill patients when serum creatinine is changing rapidly. It is not useful in  patients on dialysis. The eGFR calculation may not be applicable to patients at the low and high extremes of body sizes, pregnant women, and vegetarians.    GFR, Estimated  Date Value Ref Range Status  11/11/2021 >60 >60 mL/min Final    Comment:    (NOTE) Calculated using the CKD-EPI Creatinine Equation (2021)    eGFR  Date Value Ref Range Status  12/15/2021 97 >59 mL/min/1.73 Final         Passed - Patient is not pregnant      Passed - Valid encounter within last 12 months    Recent Outpatient Visits           6 days ago Neck pain   [place] J[place]rine Screws[place] NP   2 weeks ago Neck pain   [place], NP   2 months ago Encounter for  initial annual wellness visit in Medicare patient   [place], DO   3 months ago Acute right-sided low back pain without sciatica   [place] P, DO   5 months ago Type 2 diabetes mellitus with stage 1 chronic kidney disease, without long-term current use of insulin ([place]   [place] MBarb Merino DO       Future Appointments             In 1 week JWynetta Emery [place]Barb Merino DO [facility] [facility]   In 2 weeks JWynetta Emery MBarb Merino DO [place] [facility]

## 2022-02-27 NOTE — Telephone Encounter (Signed)
Requested Prescriptions  Pending Prescriptions Disp Refills   rosuvastatin (CRESTOR) 40 MG tablet [Pharmacy Med Name: ROSUVASTATIN CALCIUM 40 MG TAB] 90 tablet     Sig: TAKE 1 TABLET BY MOUTH EVERY DAY     Cardiovascular:  Antilipid - Statins 2 Failed - 02/27/2022  1:47 AM      Failed - Lipid Panel in normal range within the last 12 months    Cholesterol, Total  Date Value Ref Range Status  12/15/2021 101 100 - 199 mg/dL Final   Cholesterol Piccolo, Waived  Date Value Ref Range Status  09/04/2018 120 <200 mg/dL Final    Comment:                            Desirable                <200                         Borderline High      200- 239                         High                     >239    LDL Chol Calc (NIH)  Date Value Ref Range Status  12/15/2021 36 0 - 99 mg/dL Final   HDL  Date Value Ref Range Status  12/15/2021 46 >39 mg/dL Final   Triglycerides  Date Value Ref Range Status  12/15/2021 100 0 - 149 mg/dL Final   Triglycerides Piccolo,Waived  Date Value Ref Range Status  09/04/2018 169 (H) <150 mg/dL Final    Comment:                            Normal                   <150                         Borderline High     150 - 199                         High                200 - 499                         Very High                >499          Passed - Cr in normal range and within 360 days    Creatinine  Date Value Ref Range Status  09/18/2012 0.78 0.60 - 1.30 mg/dL Final   Creatinine, Ser  Date Value Ref Range Status  12/15/2021 0.80 0.76 - 1.27 mg/dL Final         Passed - Patient is not pregnant      Passed - Valid encounter within last 12 months    Recent Outpatient Visits           6 days ago Neck pain   [place], Henrine Screws T, NP   2 weeks ago Neck pain   [place],  Barbaraann Faster, NP   2 months ago Encounter for initial annual wellness visit in Medicare patient   [place] P, DO   3 months ago Acute right-sided low back pain without sciatica   [facility] [place], Megan P, DO   5 months ago Type 2 diabetes mellitus with stage 1 chronic kidney disease, without long-term current use of insulin ([place])   [place], DO       Future Appointments             In 1 week Wynetta Emery, Barb Merino, DO [place], [facility]   In 2 weeks Wynetta Emery, Barb Merino, DO [place], [facility]             meloxicam (MOBIC) 15 MG tablet [Pharmacy Med Name: MELOXICAM 15 MG TABLET] 90 tablet     Sig: TAKE 1 TABLET (15 MG TOTAL) BY MOUTH DAILY.     Analgesics:  COX2 Inhibitors Failed - 02/27/2022  1:47 AM      Failed - Manual Review: Labs are only required if the patient has taken medication for more than 8 weeks.      Passed - HGB in normal range and within 360 days    Hemoglobin  Date Value Ref Range Status  12/15/2021 14.6 13.0 - 17.7 g/dL Final         Passed - Cr in normal range and within 360 days    Creatinine  Date Value Ref Range Status  09/18/2012 0.78 0.60 - 1.30 mg/dL Final   Creatinine, Ser  Date Value Ref Range Status  12/15/2021 0.80 0.76 - 1.27 mg/dL Final         Passed - HCT in normal range and within 360 days    Hematocrit  Date Value Ref Range Status  12/15/2021 43.0 37.5 - 51.0 % Final         Passed - AST in normal range and within 360 days    AST  Date Value Ref Range Status  12/15/2021 32 0 - 40 IU/L Final   SGOT(AST)  Date Value Ref Range Status  09/15/2012 36 15 - 37 Unit/L Final   AST (SGOT) Piccolo, Waived  Date Value Ref Range Status  09/04/2018 38 11 - 38 U/L Final         Passed - ALT in normal range and within 360 days    ALT  Date Value Ref Range Status  12/15/2021 32 0 - 44 IU/L Final   SGPT (ALT)  Date Value Ref Range Status  09/15/2012 37 12 - 78 U/L Final   ALT (SGPT)  Piccolo, Waived  Date Value Ref Range Status  09/04/2018 40 10 - 47 U/L Final         Passed - eGFR is 30 or above and within 360 days    EGFR (African American)  Date Value Ref Range Status  09/18/2012 >60  Final   GFR calc Af Amer  Date Value Ref Range Status  11/30/2019 107 >59 mL/min/1.73 Final    Comment:    **In accordance with recommendations from the NKF-ASN Task force,**   [facility] is in the process of updating its eGFR calculation to the   2021 CKD-EPI creatinine equation that estimates kidney function   without a race variable.    EGFR (Non-African Amer.)  Date Value Ref Range Status  09/18/2012 >60  Final  Comment:    eGFR values <34m/min/1.73 m2 may be an indication of chronic kidney disease (CKD). Calculated eGFR is useful in patients with stable renal function. The eGFR calculation will not be reliable in acutely ill patients when serum creatinine is changing rapidly. It is not useful in  patients on dialysis. The eGFR calculation may not be applicable to patients at the low and high extremes of body sizes, pregnant women, and vegetarians.    GFR, Estimated  Date Value Ref Range Status  11/11/2021 >60 >60 mL/min Final    Comment:    (NOTE) Calculated using the CKD-EPI Creatinine Equation (2021)    eGFR  Date Value Ref Range Status  12/15/2021 97 >59 mL/min/1.73 Final         Passed - Patient is not pregnant      Passed - Valid encounter within last 12 months    Recent Outpatient Visits           6 days ago Neck pain   [place] J[place]rine ScrewsT, NP   2 weeks ago Neck pain   [place], NP   2 months ago Encounter for initial annual wellness visit in Medicare patient   [place], DO   3 months ago Acute right-sided low back pain without sciatica   [place] Megan P, DO   5 months ago Type 2  diabetes mellitus with stage 1 chronic kidney disease, without long-term current use of insulin ([place]   [place] MBarb Merino DO       Future Appointments             In 1 week JWynetta Emery MBarb Merino DO [place] [facility]   In 2 weeks JWynetta Emery MBarb Merino DO [facility] [facility]

## 2022-02-27 NOTE — Telephone Encounter (Signed)
Meloxicam last refill 11/14/21 #90 1 RF refused. Routed to [facility].

## 2022-03-04 ENCOUNTER — Other Ambulatory Visit: Payer: Self-pay | Admitting: Family Medicine

## 2022-03-04 DIAGNOSIS — E1122 Type 2 diabetes mellitus with diabetic chronic kidney disease: Secondary | ICD-10-CM

## 2022-03-05 NOTE — Telephone Encounter (Signed)
Requested Prescriptions  Pending Prescriptions Disp Refills   metFORMIN (GLUCOPHAGE) 500 MG tablet [Pharmacy Med Name: METFORMIN HCL 500 MG TABLET] 360 tablet 1    Sig: TAKE 2 TABLETS (1,000 MG TOTAL) BY MOUTH 2 (TWO) TIMES DAILY WITH A MEAL.     Endocrinology:  Diabetes - Biguanides Failed - 03/04/2022  8:48 AM      Failed - B12 Level in normal range and within 720 days    No results found for: "VITAMINB12"       Passed - Cr in normal range and within 360 days    Creatinine  Date Value Ref Range Status  09/18/2012 0.78 0.60 - 1.30 mg/dL Final   Creatinine, Ser  Date Value Ref Range Status  12/15/2021 0.80 0.76 - 1.27 mg/dL Final         Passed - HBA1C is between 0 and 7.9 and within 180 days    Hemoglobin A1C  Date Value Ref Range Status  09/16/2012 5.6 4.2 - 6.3 % Final    Comment:    The American Diabetes Association recommends that a primary goal of therapy should be <7% and that physicians should reevaluate the treatment regimen in patients with HbA1c values consistently >8%.    HB A1C (BAYER DCA - WAIVED)  Date Value Ref Range Status  12/15/2021 7.1 (H) 4.8 - 5.6 % Final    Comment:             Prediabetes: 5.7 - 6.4          Diabetes: >6.4          Glycemic control for adults with diabetes: <7.0          Passed - eGFR in normal range and within 360 days    EGFR (African American)  Date Value Ref Range Status  09/18/2012 >60  Final   GFR calc Af Amer  Date Value Ref Range Status  11/30/2019 107 >59 mL/min/1.73 Final    Comment:    **In accordance with recommendations from the NKF-ASN Task force,**   [facility] is in the process of updating its eGFR calculation to the   2021 CKD-EPI creatinine equation that estimates kidney function   without a race variable.    EGFR (Non-African Amer.)  Date Value Ref Range Status  09/18/2012 >60  Final    Comment:    eGFR values <53m/min/1.73 m2 may be an indication of chronic kidney disease (CKD). Calculated eGFR is  useful in patients with stable renal function. The eGFR calculation will not be reliable in acutely ill patients when serum creatinine is changing rapidly. It is not useful in  patients on dialysis. The eGFR calculation may not be applicable to patients at the low and high extremes of body sizes, pregnant women, and vegetarians.    GFR, Estimated  Date Value Ref Range Status  11/11/2021 >60 >60 mL/min Final    Comment:    (NOTE) Calculated using the CKD-EPI Creatinine Equation (2021)    eGFR  Date Value Ref Range Status  12/15/2021 97 >59 mL/min/1.73 Final         Passed - Valid encounter within last 6 months    Recent Outpatient Visits           1 week ago Neck pain   [place] JHenrine ScrewsT, NP   3 weeks ago Neck pain   [place], NP   2 months ago Encounter for initial annual  wellness visit in Medicare patient   [place] P, DO   3 months ago Acute right-sided low back pain without sciatica   [place] [facility] [place], Megan P, DO   5 months ago Type 2 diabetes mellitus with stage 1 chronic kidney disease, without long-term current use of insulin ([place])   [place], Barb Merino, DO       Future Appointments             In 1 week Wynetta Emery, Barb Merino, DO [place], [facility]   In 1 week Wynetta Emery, Barb Merino, DO [place], [facility]            Passed - CBC within normal limits and completed in the last 12 months    WBC  Date Value Ref Range Status  12/15/2021 5.4 3.4 - 10.8 x10E3/uL Final  11/11/2021 10.4 4.0 - 10.5 K/uL Final   RBC  Date Value Ref Range Status  12/15/2021 4.82 4.14 - 5.80 x10E6/uL Final  11/11/2021 4.78 4.22 - 5.81 MIL/uL Final   Hemoglobin  Date Value Ref Range Status  12/15/2021 14.6 13.0 - 17.7 g/dL Final   Hematocrit  Date Value Ref  Range Status  12/15/2021 43.0 37.5 - 51.0 % Final   MCHC  Date Value Ref Range Status  12/15/2021 34.0 31.5 - 35.7 g/dL Final  11/11/2021 33.7 30.0 - 36.0 g/dL Final   [facility]  Date Value Ref Range Status  12/15/2021 30.3 26.6 - 33.0 pg Final  11/11/2021 29.5 26.0 - 34.0 pg Final   MCV  Date Value Ref Range Status  12/15/2021 89 79 - 97 fL Final  09/17/2012 89 80 - 100 fL Final   No results found for: "PLTCOUNTKUC", "LABPLAT", "POCPLA" RDW  Date Value Ref Range Status  12/15/2021 13.1 11.6 - 15.4 % Final  09/17/2012 13.2 11.5 - 14.5 % Final

## 2022-03-12 ENCOUNTER — Ambulatory Visit (INDEPENDENT_AMBULATORY_CARE_PROVIDER_SITE_OTHER): Payer: HMO | Admitting: Family Medicine

## 2022-03-12 ENCOUNTER — Encounter: Payer: Self-pay | Admitting: Family Medicine

## 2022-03-12 VITALS — BP 152/86 | HR 72 | Temp 98.2°F | Ht 67.0 in | Wt 260.2 lb

## 2022-03-12 DIAGNOSIS — N181 Chronic kidney disease, stage 1: Secondary | ICD-10-CM | POA: Diagnosis not present

## 2022-03-12 DIAGNOSIS — E1122 Type 2 diabetes mellitus with diabetic chronic kidney disease: Secondary | ICD-10-CM | POA: Diagnosis not present

## 2022-03-12 DIAGNOSIS — M542 Cervicalgia: Secondary | ICD-10-CM

## 2022-03-12 DIAGNOSIS — I129 Hypertensive chronic kidney disease with stage 1 through stage 4 chronic kidney disease, or unspecified chronic kidney disease: Secondary | ICD-10-CM | POA: Diagnosis not present

## 2022-03-12 MED ORDER — LOSARTAN POTASSIUM 50 MG PO TABS: 75.0000 mg | ORAL_TABLET | Freq: Every day | ORAL | 0 refills | Status: DC

## 2022-03-12 NOTE — Assessment & Plan Note (Signed)
Not under good control. Unable to be read with fingerstick- will send out A1c. Await results. Treat as needed.

## 2022-03-12 NOTE — Progress Notes (Signed)
BP (!) 152/86   Pulse 72   Temp 98.2 F (36.8 C) (Oral)   Ht '5\' 7"'$  (1.702 m)   Wt 260 lb 3.2 oz (118 kg)   SpO2 96%   BMI 40.75 kg/m    Subjective:    Patient ID: Robert Griffith, male    DOB: 09-11-1955, 67 y.o.   MRN: [id number]  HPI: Robert Griffith is a 67 y.o. male  Chief Complaint  Patient presents with   Neck Pain   Diabetes    Patient declines having a recent Diabetic Eye Exam.    NECK PAIN FOLLOW UP Status: resolved Treatments attempted: balcofen and tramadol  Compliant with recommended treatment: yes Relief with NSAIDs?:  No NSAIDs Taken Location:Left Duration: about 1-2 months Severity: severe Quality: sharp shooting Frequency: intermittent Radiation: down L arm Aggravating factors: laying on it Alleviating factors: medicine Weakness:  no Paresthesias / decreased sensation:  no  Fevers:  no  DIABETES Hypoglycemic episodes:no Polydipsia/polyuria: no Visual disturbance: no Chest pain: no Paresthesias: no Glucose Monitoring: yes  Accucheck frequency: Daily  Fasting glucose: 130 Taking Insulin?: no Blood Pressure Monitoring: not checking Retinal Examination: Not up to Date Foot Exam: Up to Date Diabetic Education: Completed Pneumovax: Up to Date Influenza: Up to Date Aspirin: yes  HYPERTENSION  Hypertension status: uncontrolled  Satisfied with current treatment? yes Duration of hypertension: chronic BP monitoring frequency:  not checking BP medication side effects:  no Medication compliance: excellent compliance Previous BP meds: losartan Aspirin: yes Recurrent headaches: no Visual changes: no Palpitations: no Dyspnea: no Chest pain: no Lower extremity edema: no Dizzy/lightheaded: no  Relevant past medical, surgical, family and social history reviewed and updated as indicated. Interim medical history since our last visit reviewed. Allergies and medications reviewed and updated.  Review of Systems  Constitutional: Negative.    Respiratory: Negative.    Cardiovascular: Negative.   Gastrointestinal: Negative.   Musculoskeletal:  Positive for myalgias, neck pain and neck stiffness. Negative for arthralgias, back pain, gait problem and joint swelling.  Skin: Negative.   Psychiatric/Behavioral: Negative.      Per HPI unless specifically indicated above     Objective:    BP (!) 152/86   Pulse 72   Temp 98.2 F (36.8 C) (Oral)   Ht '5\' 7"'$  (1.702 m)   Wt 260 lb 3.2 oz (118 kg)   SpO2 96%   BMI 40.75 kg/m   Wt Readings from Last 3 Encounters:  03/12/22 260 lb 3.2 oz (118 kg)  02/21/22 255 lb 3.2 oz (115.8 kg)  02/12/22 256 lb 14.4 oz (116.5 kg)    Physical Exam Vitals and nursing note reviewed.  Constitutional:      General: He is not in acute distress.    Appearance: Normal appearance. He is obese. He is not ill-appearing, toxic-appearing or diaphoretic.  HENT:     Head: Normocephalic and atraumatic.     Right Ear: External ear normal.     Left Ear: External ear normal.     Nose: Nose normal.     Mouth/Throat:     Mouth: Mucous membranes are moist.     Pharynx: Oropharynx is clear.  Eyes:     General: No scleral icterus.       Right eye: No discharge.        Left eye: No discharge.     Extraocular Movements: Extraocular movements intact.     Conjunctiva/sclera: Conjunctivae normal.     Pupils: Pupils are equal, round,  and reactive to light.  Cardiovascular:     Rate and Rhythm: Normal rate and regular rhythm.     Pulses: Normal pulses.     Heart sounds: Normal heart sounds. No murmur heard.    No friction rub. No gallop.  Pulmonary:     Effort: Pulmonary effort is normal. No respiratory distress.     Breath sounds: Normal breath sounds. No stridor. No wheezing, rhonchi or rales.  Chest:     Chest wall: No tenderness.  Musculoskeletal:        General: Normal range of motion.     Cervical back: Normal range of motion and neck supple.  Skin:    General: Skin is warm and dry.      Capillary Refill: Capillary refill takes less than 2 seconds.     Coloration: Skin is not jaundiced or pale.     Findings: No bruising, erythema, lesion or rash.  Neurological:     General: No focal deficit present.     Mental Status: He is alert and oriented to person, place, and time. Mental status is at baseline.  Psychiatric:        Mood and Affect: Mood normal.        Behavior: Behavior normal.        Thought Content: Thought content normal.        Judgment: Judgment normal.     Results for orders placed or performed in visit on 12/15/21  Comprehensive metabolic panel  Result Value Ref Range   Glucose 201 (H) 70 - 99 mg/dL   BUN 13 8 - 27 mg/dL   Creatinine, Ser 0.80 0.76 - 1.27 mg/dL   eGFR 97 >59 mL/min/1.73   BUN/Creatinine Ratio 16 10 - 24   Sodium 142 134 - 144 mmol/L   Potassium 4.4 3.5 - 5.2 mmol/L   Chloride 103 96 - 106 mmol/L   CO2 19 (L) 20 - 29 mmol/L   Calcium 9.5 8.6 - 10.2 mg/dL   Total Protein 6.5 6.0 - 8.5 g/dL   Albumin 4.4 3.9 - 4.9 g/dL   Globulin, Total 2.1 1.5 - 4.5 g/dL   Albumin/Globulin Ratio 2.1 1.2 - 2.2   Bilirubin Total 0.4 0.0 - 1.2 mg/dL   Alkaline Phosphatase 67 44 - 121 IU/L   AST 32 0 - 40 IU/L   ALT 32 0 - 44 IU/L  CBC with Differential/Platelet  Result Value Ref Range   WBC 5.4 3.4 - 10.8 x10E3/uL   RBC 4.82 4.14 - 5.80 x10E6/uL   Hemoglobin 14.6 13.0 - 17.7 g/dL   Hematocrit 43.0 37.5 - 51.0 %   MCV 89 79 - 97 fL   MCH 30.3 26.6 - 33.0 pg   MCHC 34.0 31.5 - 35.7 g/dL   RDW 13.1 11.6 - 15.4 %   Platelets 177 150 - 450 x10E3/uL   Neutrophils 57 Not Estab. %   Lymphs 29 Not Estab. %   Monocytes 7 Not Estab. %   Eos 6 Not Estab. %   Basos 1 Not Estab. %   Neutrophils Absolute 3.1 1.4 - 7.0 x10E3/uL   Lymphocytes Absolute 1.5 0.7 - 3.1 x10E3/uL   Monocytes Absolute 0.4 0.1 - 0.9 x10E3/uL   EOS (ABSOLUTE) 0.3 0.0 - 0.4 x10E3/uL   Basophils Absolute 0.1 0.0 - 0.2 x10E3/uL   Immature Granulocytes 0 Not Estab. %   Immature  Grans (Abs) 0.0 0.0 - 0.1 x10E3/uL  Lipid Panel w/o Chol/HDL Ratio  Result Value Ref Range  Cholesterol, Total 101 100 - 199 mg/dL   Triglycerides 100 0 - 149 mg/dL   HDL 46 >39 mg/dL   VLDL Cholesterol Cal 19 5 - 40 mg/dL   LDL Chol Calc (NIH) 36 0 - 99 mg/dL  PSA  Result Value Ref Range   Prostate Specific Ag, Serum 1.2 0.0 - 4.0 ng/mL  TSH  Result Value Ref Range   TSH 3.250 0.450 - 4.500 uIU/mL  Urinalysis, Routine w reflex microscopic  Result Value Ref Range   Specific Gravity, UA 1.020 1.005 - 1.030   pH, UA 6.0 5.0 - 7.5   Color, UA Yellow Yellow   Appearance Ur Clear Clear   Leukocytes,UA Negative Negative   Protein,UA Negative Negative/Trace   Glucose, UA Negative Negative   Ketones, UA Negative Negative   RBC, UA Negative Negative   Bilirubin, UA Negative Negative   Urobilinogen, Ur 0.2 0.2 - 1.0 mg/dL   Nitrite, UA Negative Negative   Microscopic Examination Comment   Microalbumin, Urine Waived  Result Value Ref Range   Microalb, Ur Waived 30 (H) 0 - 19 mg/L   Creatinine, Urine Waived 50 10 - 300 mg/dL   Microalb/Creat Ratio 30-300 (H) <30 mg/g  Bayer DCA Hb A1c Waived  Result Value Ref Range   HB A1C (BAYER DCA - WAIVED) 7.1 (H) 4.8 - 5.6 %      Assessment & Plan:   Problem List Items Addressed This Visit       Endocrine   Type 2 diabetes mellitus with stage 1 chronic kidney disease, without long-term current use of insulin ([place]) - Primary    Not under good control. Unable to be read with fingerstick- will send out A1c. Await results. Treat as needed.       Relevant Medications   losartan (COZAAR) 50 MG tablet   Other Relevant Orders   Bayer DCA Hb A1c Waived   Hgb A1c w/o eAG     Genitourinary   Benign hypertensive renal disease    Uncontrolled. Will increase his losartan to '75mg'$  and recheck 1 month. Call with any concerns.         Other   Neck pain    Improved. Will start stretches and slowly stop medication. If getting worse, will get him  in for x-ray and PT.         Follow up plan: Return in about 4 weeks (around 04/09/2022).

## 2022-03-12 NOTE — Assessment & Plan Note (Signed)
Improved. Will start stretches and slowly stop medication. If getting worse, will get him in for x-ray and PT.

## 2022-03-12 NOTE — Assessment & Plan Note (Signed)
Uncontrolled. Will increase his losartan to '75mg'$  and recheck 1 month. Call with any concerns.

## 2022-03-13 LAB — HGB A1C W/O EAG: Hgb A1c MFr Bld: 6.9 % — ABNORMAL HIGH (ref 4.8–5.6)

## 2022-03-15 ENCOUNTER — Other Ambulatory Visit: Payer: Self-pay | Admitting: Family Medicine

## 2022-03-15 NOTE — Telephone Encounter (Signed)
Unable to refill per protocol, Rx request is too soon. Last refill 03/12/22 for 90 days.  Requested Prescriptions  Pending Prescriptions Disp Refills   losartan (COZAAR) 50 MG tablet [Pharmacy Med Name: LOSARTAN POTASSIUM 50 MG TAB] 90 tablet     Sig: TAKE 1 TABLET BY MOUTH EVERY DAY     Cardiovascular:  Angiotensin Receptor Blockers Failed - 03/15/2022  2:00 AM      Failed - Last BP in normal range    BP Readings from Last 1 Encounters:  03/12/22 (!) 152/86         Passed - Cr in normal range and within 180 days    Creatinine  Date Value Ref Range Status  09/18/2012 0.78 0.60 - 1.30 mg/dL Final   Creatinine, Ser  Date Value Ref Range Status  12/15/2021 0.80 0.76 - 1.27 mg/dL Final         Passed - K in normal range and within 180 days    Potassium  Date Value Ref Range Status  12/15/2021 4.4 3.5 - 5.2 mmol/L Final  09/18/2012 3.9 3.5 - 5.1 mmol/L Final         Passed - Patient is not pregnant      Passed - Valid encounter within last 6 months    Recent Outpatient Visits           3 days ago Type 2 diabetes mellitus with stage 1 chronic kidney disease, without long-term current use of insulin ([place])   [place], Megan P, DO   3 weeks ago Neck pain   [place], Henrine Screws T, NP   1 month ago Neck pain   [place], [place] T, NP   3 months ago Encounter for initial annual wellness visit in Medicare patient   [place] P, DO   4 months ago Acute right-sided low back pain without sciatica   [place], [place], DO       Future Appointments             In 3 weeks Wynetta Emery, Barb Merino, DO [place], [facility]

## 2022-03-16 ENCOUNTER — Ambulatory Visit: Payer: Medicare Other | Admitting: Family Medicine

## 2022-03-19 ENCOUNTER — Other Ambulatory Visit: Payer: Self-pay | Admitting: Family Medicine

## 2022-03-19 DIAGNOSIS — E7800 Pure hypercholesterolemia, unspecified: Secondary | ICD-10-CM

## 2022-03-19 NOTE — Telephone Encounter (Signed)
Medication Refill - Medication: rosuvastatin (CRESTOR) 40 MG tablet   Pt is completely out of his current supply  Has the patient contacted their pharmacy? Yes.   (Agent: If no, request that the patient contact the pharmacy for the refill. If patient does not wish to contact the pharmacy document the reason why and proceed with request.) (Agent: If yes, when and what did the pharmacy advise?)  Preferred Pharmacy (with phone number or street name):  [id number] - [place], [place] - [address]  [address] [place] [address]  Phone: [phone] Fax: [phone]   Has the patient been seen for an appointment in the last year OR does the patient have an upcoming appointment? Yes.    Agent: Please be advised that RX refills may take up to 3 business days. We ask that you follow-up with your pharmacy.

## 2022-03-20 MED ORDER — ROSUVASTATIN CALCIUM 40 MG PO TABS: 40.0000 mg | ORAL_TABLET | Freq: Every day | ORAL | 1 refills | Status: DC

## 2022-03-20 NOTE — Telephone Encounter (Signed)
Requested Prescriptions  Pending Prescriptions Disp Refills   rosuvastatin (CRESTOR) 40 MG tablet 90 tablet 1    Sig: Take 1 tablet (40 mg total) by mouth daily.     Cardiovascular:  Antilipid - Statins 2 Failed - 03/19/2022  1:44 PM      Failed - Lipid Panel in normal range within the last 12 months    Cholesterol, Total  Date Value Ref Range Status  12/15/2021 101 100 - 199 mg/dL Final   Cholesterol Piccolo, Waived  Date Value Ref Range Status  09/04/2018 120 <200 mg/dL Final    Comment:                            Desirable                <200                         Borderline High      200- 239                         High                     >239    LDL Chol Calc (NIH)  Date Value Ref Range Status  12/15/2021 36 0 - 99 mg/dL Final   HDL  Date Value Ref Range Status  12/15/2021 46 >39 mg/dL Final   Triglycerides  Date Value Ref Range Status  12/15/2021 100 0 - 149 mg/dL Final   Triglycerides Piccolo,Waived  Date Value Ref Range Status  09/04/2018 169 (H) <150 mg/dL Final    Comment:                            Normal                   <150                         Borderline High     150 - 199                         High                200 - 499                         Very High                >499          Passed - Cr in normal range and within 360 days    Creatinine  Date Value Ref Range Status  09/18/2012 0.78 0.60 - 1.30 mg/dL Final   Creatinine, Ser  Date Value Ref Range Status  12/15/2021 0.80 0.76 - 1.27 mg/dL Final         Passed - Patient is not pregnant      Passed - Valid encounter within last 12 months    Recent Outpatient Visits           1 week ago Type 2 diabetes mellitus with stage 1 chronic kidney disease, without long-term current use of insulin ([place])   [place], Megan P, DO   3 weeks ago Neck  pain   [place], Henrine Screws T, NP   1 month ago Neck pain   [place], [place] T, NP   3 months ago Encounter for initial annual wellness visit in Medicare patient   [place] P, DO   4 months ago Acute right-sided low back pain without sciatica   [place], [place], DO       Future Appointments             In 3 weeks Wynetta Emery, Barb Merino, DO [place], [facility]

## 2022-03-26 ENCOUNTER — Ambulatory Visit
Admission: RE | Admit: 2022-03-26 | Discharge: 2022-03-26 | Disposition: A | Discharge status: Home / Self Care | Payer: HMO | Source: Home / Self Care | Attending: Family Medicine | Admitting: Family Medicine

## 2022-03-26 ENCOUNTER — Ambulatory Visit (INDEPENDENT_AMBULATORY_CARE_PROVIDER_SITE_OTHER): Payer: HMO | Admitting: Family Medicine

## 2022-03-26 ENCOUNTER — Encounter: Payer: Self-pay | Admitting: Family Medicine

## 2022-03-26 ENCOUNTER — Ambulatory Visit
Admission: RE | Admit: 2022-03-26 | Discharge: 2022-03-26 | Disposition: A | Discharge status: Home / Self Care | Payer: HMO | Source: Ambulatory Visit | Attending: Family Medicine | Admitting: Family Medicine

## 2022-03-26 VITALS — BP 138/72 | HR 58 | Temp 98.3°F | Ht 67.0 in | Wt 261.0 lb

## 2022-03-26 DIAGNOSIS — M19012 Primary osteoarthritis, left shoulder: Secondary | ICD-10-CM | POA: Diagnosis not present

## 2022-03-26 DIAGNOSIS — M542 Cervicalgia: Secondary | ICD-10-CM | POA: Diagnosis not present

## 2022-03-26 DIAGNOSIS — M79602 Pain in left arm: Secondary | ICD-10-CM | POA: Insufficient documentation

## 2022-03-26 DIAGNOSIS — I129 Hypertensive chronic kidney disease with stage 1 through stage 4 chronic kidney disease, or unspecified chronic kidney disease: Secondary | ICD-10-CM

## 2022-03-26 MED ORDER — BACLOFEN 10 MG PO TABS: ORAL_TABLET | ORAL | 0 refills | Status: DC

## 2022-03-26 NOTE — Assessment & Plan Note (Signed)
Better on recheck. Continue current regimen. Continue to monitor. BMP checked today. Call with any concerns.

## 2022-03-26 NOTE — Progress Notes (Signed)
BP 138/72   Pulse (!) 58   Temp 98.3 F (36.8 C) (Oral)   Ht 5\' 7"  (1.702 m)   Wt 261 lb (118.4 kg)   SpO2 96%   BMI 40.88 kg/m    Subjective:    Patient ID: Robert Griffith, male    DOB: 1955-08-02, 67 y.o.   MRN: [id number]  HPI: Robert Griffith is a 67 y.o. male  Chief Complaint  Patient presents with   Hand Pain    Patient says he seen Jolene in the prior visit in regards to his arm and hand pain. Patient says yesterday afternoon he has some pain that radiates down from his shoulder down into his hand. Patient says he is unable to do his every day movements due to the hand pain. Patient says the pain feels similar to a bad toothache. Patient says he was prescribed Baclofen and Meloxicam and says it helps him a little bit. Patient says he has been doing some over head work and thinks that may have triggered his pai   Arm Pain   HYPERTENSION  Hypertension status: better  Satisfied with current treatment? yes Duration of hypertension: chronic BP monitoring frequency:  not checking BP medication side effects:  no Medication compliance: excellent compliance Previous BP meds: losartan Aspirin: yes Recurrent headaches: no Visual changes: no Palpitations: no Dyspnea: no Chest pain: no Lower extremity edema: no Dizzy/lightheaded: no  NECK PAIN FOLLOW UP Status: worse Treatments attempted: lidocaine patches and baclofen  Compliant with recommended treatment: yes Relief with NSAIDs?:  mild Location:L arm and into L hand Duration: month and a half Severity: 6/10 Quality: shooting, sharp  Frequency: intermittent Radiation: L arm Aggravating factors: lifting arm Alleviating factors: baclofen, meloxicam, lidocaine patches Weakness:  no Paresthesias / decreased sensation:  yes  Fevers:  no  Relevant past medical, surgical, family and social history reviewed and updated as indicated. Interim medical history since our last visit reviewed. Allergies and medications  reviewed and updated.  Review of Systems  Per HPI unless specifically indicated above     Objective:    BP 138/72   Pulse (!) 58   Temp 98.3 F (36.8 C) (Oral)   Ht 5\' 7"  (1.702 m)   Wt 261 lb (118.4 kg)   SpO2 96%   BMI 40.88 kg/m   Wt Readings from Last 3 Encounters:  03/26/22 261 lb (118.4 kg)  03/12/22 260 lb 3.2 oz (118 kg)  02/21/22 255 lb 3.2 oz (115.8 kg)    Physical Exam Vitals and nursing note reviewed.  Constitutional:      General: He is not in acute distress.    Appearance: Normal appearance. He is obese. He is not ill-appearing, toxic-appearing or diaphoretic.  HENT:     Head: Normocephalic and atraumatic.     Right Ear: External ear normal.     Left Ear: External ear normal.     Nose: Nose normal.     Mouth/Throat:     Mouth: Mucous membranes are moist.     Pharynx: Oropharynx is clear.  Eyes:     General: No scleral icterus.       Right eye: No discharge.        Left eye: No discharge.     Extraocular Movements: Extraocular movements intact.     Conjunctiva/sclera: Conjunctivae normal.     Pupils: Pupils are equal, round, and reactive to light.  Cardiovascular:     Rate and Rhythm: Normal rate and regular rhythm.  Pulses: Normal pulses.     Heart sounds: Normal heart sounds. No murmur heard.    No friction rub. No gallop.  Pulmonary:     Effort: Pulmonary effort is normal. No respiratory distress.     Breath sounds: Normal breath sounds. No stridor. No wheezing, rhonchi or rales.  Chest:     Chest wall: No tenderness.  Musculoskeletal:        General: Normal range of motion.     Cervical back: Normal range of motion and neck supple.  Skin:    General: Skin is warm and dry.     Capillary Refill: Capillary refill takes less than 2 seconds.     Coloration: Skin is not jaundiced or pale.     Findings: No bruising, erythema, lesion or rash.  Neurological:     General: No focal deficit present.     Mental Status: He is alert and oriented to  person, place, and time. Mental status is at baseline.  Psychiatric:        Mood and Affect: Mood normal.        Behavior: Behavior normal.        Thought Content: Thought content normal.        Judgment: Judgment normal.     Results for orders placed or performed in visit on 03/12/22  Hgb A1c w/o eAG  Result Value Ref Range   Hgb A1c MFr Bld 6.9 (H) 4.8 - 5.6 %      Assessment & Plan:   Problem List Items Addressed This Visit       Genitourinary   Benign hypertensive renal disease - Primary    Better on recheck. Continue current regimen. Continue to monitor. BMP checked today. Call with any concerns.       Relevant Orders   Basic metabolic panel     Other   Neck pain    Acting up again. Will get him into PT and check x-rays. Continue baclofen and meloxicam. Call with any concerns.       Relevant Orders   DG Cervical Spine Complete   Ambulatory referral to Physical Therapy   Other Visit Diagnoses     Left arm pain       Concern for cervical radiculopathy. Will check x-rays and get him in for PT. Call with any concerns.   Relevant Orders   DG Shoulder Left   DG Cervical Spine Complete   Ambulatory referral to Physical Therapy        Follow up plan: Return in about 4 weeks (around 04/23/2022) for OK to cancel appt 04/10/22. thanks!Marland Kitchen

## 2022-03-26 NOTE — Assessment & Plan Note (Signed)
Acting up again. Will get him into PT and check x-rays. Continue baclofen and meloxicam. Call with any concerns.

## 2022-03-27 LAB — BASIC METABOLIC PANEL
BUN/Creatinine Ratio: 16 (ref 10–24)
BUN: 14 mg/dL (ref 8–27)
CO2: 26 mmol/L (ref 20–29)
Calcium: 9.3 mg/dL (ref 8.6–10.2)
Chloride: 101 mmol/L (ref 96–106)
Creatinine, Ser: 0.89 mg/dL (ref 0.76–1.27)
Glucose: 132 mg/dL — ABNORMAL HIGH (ref 70–99)
Potassium: 4.2 mmol/L (ref 3.5–5.2)
Sodium: 139 mmol/L (ref 134–144)
eGFR: 93 mL/min/{1.73_m2} (ref >59)

## 2022-04-08 ENCOUNTER — Other Ambulatory Visit: Payer: Self-pay | Admitting: Family Medicine

## 2022-04-10 ENCOUNTER — Ambulatory Visit: Payer: HMO | Admitting: Family Medicine

## 2022-04-10 NOTE — Telephone Encounter (Signed)
Requested Prescriptions  Pending Prescriptions Disp Refills   montelukast (SINGULAIR) 10 MG tablet [Pharmacy Med Name: MONTELUKAST SOD 10 MG TABLET] 90 tablet 0    Sig: TAKE 1 TABLET BY MOUTH EVERYDAY AT BEDTIME     Pulmonology:  Leukotriene Inhibitors Passed - 04/08/2022  9:44 AM      Passed - Valid encounter within last 12 months    Recent Outpatient Visits           2 weeks ago Benign hypertensive renal disease   [place] [facility] [place], Megan P, DO   4 weeks ago Type 2 diabetes mellitus with stage 1 chronic kidney disease, without long-term current use of insulin ([facility])   [place] [facility] [employer], Megan P, DO   1 month ago Neck pain   [place] [facility] [place], [place] T, NP   1 month ago Neck pain   [place] [facility] [place], [place] T, NP   3 months ago Encounter for initial annual wellness visit in Medicare patient   [place] [facility] [place], [place], DO       Future Appointments             In 1 week Laural Benes, Oralia Rud, DO [facility] [facility], [facility]

## 2022-04-23 ENCOUNTER — Ambulatory Visit (INDEPENDENT_AMBULATORY_CARE_PROVIDER_SITE_OTHER): Payer: HMO | Admitting: Family Medicine

## 2022-04-23 ENCOUNTER — Encounter: Payer: Self-pay | Admitting: Family Medicine

## 2022-04-23 VITALS — BP 127/77 | HR 76 | Temp 99.0°F | Ht 67.0 in | Wt 260.7 lb

## 2022-04-23 DIAGNOSIS — M542 Cervicalgia: Secondary | ICD-10-CM | POA: Diagnosis not present

## 2022-04-23 NOTE — Assessment & Plan Note (Signed)
Feeling better. Did not do PT due to coverage. Continue to monitor. Call if getting worse.

## 2022-04-23 NOTE — Progress Notes (Signed)
BP 127/77 (BP Location: Left Arm, Cuff Size: Normal)   Pulse 76   Temp 99 F (37.2 C) (Oral)   Ht  (1.702 m)   Wt 260 lb 11.2 oz (118.3 kg)   SpO2 98%   BMI 40.83 kg/m    Subjective:    Patient ID: Robert Griffith, male    DOB: Mar 20, 1955, 67 y.o.   MRN: [id number]  HPI: Robert Griffith is a 67 y.o. male  Chief Complaint  Patient presents with   Neck Pain   NECK PAIN FOLLOW UP- Feeling better with his neck and his shoulder. Did not go to PT because they didn't take his insurance.  Status: better Treatments attempted: rest, ice, heat, APAP, ibuprofen, aleve, muscle relaxer, and HEP  Compliant with recommended treatment: yes Relief with NSAIDs?:  mild Location: L arm into L hand Duration: about 2.5 months Severity: 3/10 Quality: shooting and sharp Frequency: none now Radiation: L arm Aggravating factors: lifting Alleviating factors: baclofen, meloxicam, lidocaine patches Weakness:  no Paresthesias / decreased sensation:  yes  Fevers:  no  Relevant past medical, surgical, family and social history reviewed and updated as indicated. Interim medical history since our last visit reviewed. Allergies and medications reviewed and updated.  Review of Systems  Constitutional: Negative.   Respiratory: Negative.    Cardiovascular: Negative.   Musculoskeletal:  Positive for arthralgias, back pain, myalgias, neck pain and neck stiffness. Negative for gait problem and joint swelling.  Skin: Negative.   Psychiatric/Behavioral: Negative.      Per HPI unless specifically indicated above     Objective:    BP 127/77 (BP Location: Left Arm, Cuff Size: Normal)   Pulse 76   Temp 99 F (37.2 C) (Oral)   Ht  (1.702 m)   Wt 260 lb 11.2 oz (118.3 kg)   SpO2 98%   BMI 40.83 kg/m   Wt Readings from Last 3 Encounters:  04/23/22 260 lb 11.2 oz (118.3 kg)  03/26/22 261 lb (118.4 kg)  03/12/22 260 lb 3.2 oz (118 kg)    Physical Exam Vitals and nursing note reviewed.   Constitutional:      General: He is not in acute distress.    Appearance: Normal appearance. He is not ill-appearing, toxic-appearing or diaphoretic.  HENT:     Head: Normocephalic and atraumatic.     Right Ear: External ear normal.     Left Ear: External ear normal.     Nose: Nose normal.     Mouth/Throat:     Mouth: Mucous membranes are moist.     Pharynx: Oropharynx is clear.  Eyes:     General: No scleral icterus.       Right eye: No discharge.        Left eye: No discharge.     Extraocular Movements: Extraocular movements intact.     Conjunctiva/sclera: Conjunctivae normal.     Pupils: Pupils are equal, round, and reactive to light.  Cardiovascular:     Rate and Rhythm: Normal rate and regular rhythm.     Pulses: Normal pulses.     Heart sounds: Normal heart sounds. No murmur heard.    No friction rub. No gallop.  Pulmonary:     Effort: Pulmonary effort is normal. No respiratory distress.     Breath sounds: Normal breath sounds. No stridor. No wheezing, rhonchi or rales.  Chest:     Chest wall: No tenderness.  Musculoskeletal:  General: Normal range of motion.     Cervical back: Normal range of motion and neck supple.  Skin:    General: Skin is warm and dry.     Capillary Refill: Capillary refill takes less than 2 seconds.     Coloration: Skin is not jaundiced or pale.     Findings: No bruising, erythema, lesion or rash.  Neurological:     General: No focal deficit present.     Mental Status: He is alert and oriented to person, place, and time. Mental status is at baseline.  Psychiatric:        Mood and Affect: Mood normal.        Behavior: Behavior normal.        Thought Content: Thought content normal.        Judgment: Judgment normal.     Results for orders placed or performed in visit on 03/26/22  Basic metabolic panel  Result Value Ref Range   Glucose 132 (H) 70 - 99 mg/dL   BUN 14 8 - 27 mg/dL   Creatinine, Ser 1.61 0.76 - 1.27 mg/dL   eGFR 93  >09 UE/AVW/0.98   BUN/Creatinine Ratio 16 10 - 24   Sodium 139 134 - 144 mmol/L   Potassium 4.2 3.5 - 5.2 mmol/L   Chloride 101 96 - 106 mmol/L   CO2 26 20 - 29 mmol/L   Calcium 9.3 8.6 - 10.2 mg/dL      Assessment & Plan:   Problem List Items Addressed This Visit       Other   Neck pain - Primary    Feeling better. Did not do PT due to coverage. Continue to monitor. Call if getting worse.         Follow up plan: Return Around 06/12/22.

## 2022-04-26 ENCOUNTER — Other Ambulatory Visit: Payer: Self-pay | Admitting: Family Medicine

## 2022-04-26 NOTE — Telephone Encounter (Signed)
Requested medications are due for refill today.  no  Requested medications are on the active medications list.  yes  Last refill. 03/26/2022 #60 0 rf  Future visit scheduled.   yes  Notes to clinic.  Pt requesting a 90 day supply.    Requested Prescriptions  Pending Prescriptions Disp Refills   baclofen (LIORESAL) 10 MG tablet [Pharmacy Med Name: BACLOFEN 10 MG TABLET] 90 tablet 1    Sig: TAKE 1/2 TO 1 TABLET (5-10 MG TOTAL) BY MOUTH EVERY DAY AT BEDTIME AS NEEDED FOR MUSCLE SPASM     Analgesics:  Muscle Relaxants - baclofen Passed - 04/26/2022 10:31 AM      Passed - Cr in normal range and within 180 days    Creatinine  Date Value Ref Range Status  09/18/2012 0.78 0.60 - 1.30 mg/dL Final   Creatinine, Ser  Date Value Ref Range Status  03/26/2022 0.89 0.76 - 1.27 mg/dL Final         Passed - eGFR is 30 or above and within 180 days    EGFR (African American)  Date Value Ref Range Status  09/18/2012 >60  Final   GFR calc Af Amer  Date Value Ref Range Status  11/30/2019 107 >59 mL/min/1.73 Final    Comment:    **In accordance with recommendations from the NKF-ASN Task force,**   [facility] is in the process of updating its eGFR calculation to the   2021 CKD-EPI creatinine equation that estimates kidney function   without a race variable.    EGFR (Non-African Amer.)  Date Value Ref Range Status  09/18/2012 >60  Final    Comment:    eGFR values <31mL/min/1.73 m2 may be an indication of chronic kidney disease (CKD). Calculated eGFR is useful in patients with stable renal function. The eGFR calculation will not be reliable in acutely ill patients when serum creatinine is changing rapidly. It is not useful in  patients on dialysis. The eGFR calculation may not be applicable to patients at the low and high extremes of body sizes, pregnant women, and vegetarians.    GFR, Estimated  Date Value Ref Range Status  11/11/2021 >60 >60 mL/min Final    Comment:     (NOTE) Calculated using the CKD-EPI Creatinine Equation (2021)    eGFR  Date Value Ref Range Status  03/26/2022 93 >59 mL/min/1.73 Final         Passed - Valid encounter within last 6 months    Recent Outpatient Visits           3 days ago Neck pain   [place] [facility] [place], Megan P, DO   1 month ago Benign hypertensive renal disease   [place] [facility] [place], Megan P, DO   1 month ago Type 2 diabetes mellitus with stage 1 chronic kidney disease, without long-term current use of insulin (HCC)   [place] [facility] [place], Megan P, DO   2 months ago Neck pain   [place] [facility] [place], Bertram T, NP   2 months ago Neck pain   [place] [facility] [place], Corrie Dandy T, NP       Future Appointments             In 1 month Johnson, Oralia Rud, DO [place] [facility], [facility]

## 2022-05-02 ENCOUNTER — Ambulatory Visit: Payer: HMO | Admitting: Family Medicine

## 2022-05-22 DIAGNOSIS — H524 Presbyopia: Secondary | ICD-10-CM | POA: Diagnosis not present

## 2022-05-22 DIAGNOSIS — H52223 Regular astigmatism, bilateral: Secondary | ICD-10-CM | POA: Diagnosis not present

## 2022-05-22 DIAGNOSIS — Z7984 Long term (current) use of oral hypoglycemic drugs: Secondary | ICD-10-CM | POA: Diagnosis not present

## 2022-05-22 DIAGNOSIS — H2513 Age-related nuclear cataract, bilateral: Secondary | ICD-10-CM | POA: Diagnosis not present

## 2022-05-22 DIAGNOSIS — H5203 Hypermetropia, bilateral: Secondary | ICD-10-CM | POA: Diagnosis not present

## 2022-05-22 DIAGNOSIS — E119 Type 2 diabetes mellitus without complications: Secondary | ICD-10-CM | POA: Diagnosis not present

## 2022-05-22 LAB — HM DIABETES EYE EXAM

## 2022-05-23 ENCOUNTER — Telehealth: Payer: Self-pay

## 2022-05-23 NOTE — Telephone Encounter (Signed)
Patient most recent Diabetic Eye Exam has been received and abstracted into patient's chart.

## 2022-05-23 NOTE — Telephone Encounter (Signed)
-----   Message from Dorcas Carrow, [place] sent at 04/23/2022  2:50 PM EDT ----- Check on eye exam with Dr. Larence Penning

## 2022-06-07 ENCOUNTER — Other Ambulatory Visit: Payer: Self-pay | Admitting: Family Medicine

## 2022-06-07 NOTE — Telephone Encounter (Signed)
Requested Prescriptions  Pending Prescriptions Disp Refills   losartan (COZAAR) 50 MG tablet [Pharmacy Med Name: LOSARTAN POTASSIUM 50 MG TAB] 135 tablet 0    Sig: TAKE 1 AND 1/2 TABLETS BY MOUTH DAILY     Cardiovascular:  Angiotensin Receptor Blockers Passed - 06/07/2022  2:42 AM      Passed - Cr in normal range and within 180 days    Creatinine  Date Value Ref Range Status  09/18/2012 0.78 0.60 - 1.30 mg/dL Final   Creatinine, Ser  Date Value Ref Range Status  03/26/2022 0.89 0.76 - 1.27 mg/dL Final         Passed - K in normal range and within 180 days    Potassium  Date Value Ref Range Status  03/26/2022 4.2 3.5 - 5.2 mmol/L Final  09/18/2012 3.9 3.5 - 5.1 mmol/L Final         Passed - Patient is not pregnant      Passed - Last BP in normal range    BP Readings from Last 1 Encounters:  04/23/22 127/77         Passed - Valid encounter within last 6 months    Recent Outpatient Visits           1 month ago Neck pain   [place] [facility] [place], Megan P, DO   2 months ago Benign hypertensive renal disease   [place] [place], Megan P, DO   2 months ago Type 2 diabetes mellitus with stage 1 chronic kidney disease, without long-term current use of insulin (HCC)   [place] [facility] [place], Megan P, DO   3 months ago Neck pain   [place] [facility] [place], Elberta T, NP   3 months ago Neck pain   [place] [facility] [place], Corrie Dandy T, NP       Future Appointments             In 5 days Dorcas Carrow, DO [place] [facility] [place], [facility]

## 2022-06-12 ENCOUNTER — Encounter: Payer: Self-pay | Admitting: Family Medicine

## 2022-06-12 ENCOUNTER — Ambulatory Visit (INDEPENDENT_AMBULATORY_CARE_PROVIDER_SITE_OTHER): Payer: HMO | Admitting: Family Medicine

## 2022-06-12 VITALS — BP 158/72 | HR 72 | Temp 98.3°F | Ht 67.0 in | Wt 259.8 lb

## 2022-06-12 DIAGNOSIS — Z7984 Long term (current) use of oral hypoglycemic drugs: Secondary | ICD-10-CM | POA: Diagnosis not present

## 2022-06-12 DIAGNOSIS — E7800 Pure hypercholesterolemia, unspecified: Secondary | ICD-10-CM

## 2022-06-12 DIAGNOSIS — I129 Hypertensive chronic kidney disease with stage 1 through stage 4 chronic kidney disease, or unspecified chronic kidney disease: Secondary | ICD-10-CM

## 2022-06-12 DIAGNOSIS — E1122 Type 2 diabetes mellitus with diabetic chronic kidney disease: Secondary | ICD-10-CM

## 2022-06-12 DIAGNOSIS — N181 Chronic kidney disease, stage 1: Secondary | ICD-10-CM | POA: Diagnosis not present

## 2022-06-12 LAB — BAYER DCA HB A1C WAIVED: HB A1C (BAYER DCA - WAIVED): 7.7 % — ABNORMAL HIGH (ref 4.8–5.6)

## 2022-06-12 MED ORDER — MONTELUKAST SODIUM 10 MG PO TABS: 10.0000 mg | ORAL_TABLET | Freq: Every day | ORAL | 1 refills | Status: DC

## 2022-06-12 MED ORDER — METFORMIN HCL 500 MG PO TABS: 1000.0000 mg | ORAL_TABLET | Freq: Two times a day (BID) | ORAL | 1 refills | Status: DC

## 2022-06-12 MED ORDER — RYBELSUS 3 MG PO TABS: 3.0000 mg | ORAL_TABLET | Freq: Every day | ORAL | 0 refills | Status: DC

## 2022-06-12 MED ORDER — ROSUVASTATIN CALCIUM 40 MG PO TABS: 40.0000 mg | ORAL_TABLET | Freq: Every day | ORAL | 1 refills | Status: DC

## 2022-06-12 MED ORDER — LOSARTAN POTASSIUM 50 MG PO TABS: 75.0000 mg | ORAL_TABLET | Freq: Every day | ORAL | 1 refills | Status: DC

## 2022-06-12 MED ORDER — RYBELSUS 7 MG PO TABS
7.0000 mg | ORAL_TABLET | Freq: Every day | ORAL | 2 refills | Status: DC
Start: 2022-06-12 — End: 2022-09-12

## 2022-06-12 NOTE — Assessment & Plan Note (Signed)
Not under good control with A1c of 7.7. Will start him on rybelsus and continue metformin. Call with any concerns. Continue to monitor.

## 2022-06-12 NOTE — Progress Notes (Signed)
BP (!) 158/72   Pulse 72   Temp 98.3 F (36.8 C) (Oral)   Ht 5\' 7"  (1.702 m)   Wt 259 lb 12.8 oz (117.8 kg)   SpO2 97%   BMI 40.69 kg/m    Subjective:    Patient ID: Robert Griffith, male    DOB: 06-18-1955, 67 y.o.   MRN: [id number]  HPI: Robert Griffith is a 67 y.o. male  Chief Complaint  Patient presents with   Hypertension   Hyperlipidemia   Diabetes   Weight Loss    Patient would like to discuss weight loss management as it is hard for him to lose weight.   DIABETES Hypoglycemic episodes:no Polydipsia/polyuria: no Visual disturbance: no Chest pain: no Paresthesias: no Glucose Monitoring: no  Accucheck frequency: Not Checking Taking Insulin?: no Blood Pressure Monitoring: not checking Retinal Examination: Up to Date Foot Exam: Up to Date Diabetic Education: Completed Pneumovax: Up to Date Influenza: Up to Date Aspirin: yes  HYPERTENSION / HYPERLIPIDEMIA Satisfied with current treatment? yes Duration of hypertension: chronic BP monitoring frequency: not checking BP medication side effects: no Past BP meds: losartan Duration of hyperlipidemia: chronic Cholesterol medication side effects: no Cholesterol supplements: none Past cholesterol medications: crestor Medication compliance: excellent compliance Aspirin: yes Recent stressors: no Recurrent headaches: no Visual changes: no Palpitations: no Dyspnea: no Chest pain: no Lower extremity edema: no Dizzy/lightheaded: no  Relevant past medical, surgical, family and social history reviewed and updated as indicated. Interim medical history since our last visit reviewed. Allergies and medications reviewed and updated.  Review of Systems  Constitutional: Negative.   Respiratory: Negative.    Cardiovascular: Negative.   Gastrointestinal: Negative.   Musculoskeletal: Negative.   Neurological: Negative.   Psychiatric/Behavioral: Negative.      Per HPI unless specifically indicated above      Objective:    BP (!) 158/72   Pulse 72   Temp 98.3 F (36.8 C) (Oral)   Ht 5\' 7"  (1.702 m)   Wt 259 lb 12.8 oz (117.8 kg)   SpO2 97%   BMI 40.69 kg/m   Wt Readings from Last 3 Encounters:  06/12/22 259 lb 12.8 oz (117.8 kg)  04/23/22 260 lb 11.2 oz (118.3 kg)  03/26/22 261 lb (118.4 kg)    Physical Exam Vitals and nursing note reviewed.  Constitutional:      General: He is not in acute distress.    Appearance: Normal appearance. He is obese. He is not ill-appearing, toxic-appearing or diaphoretic.  HENT:     Head: Normocephalic and atraumatic.     Right Ear: External ear normal.     Left Ear: External ear normal.     Nose: Nose normal.     Mouth/Throat:     Mouth: Mucous membranes are moist.     Pharynx: Oropharynx is clear.  Eyes:     General: No scleral icterus.       Right eye: No discharge.        Left eye: No discharge.     Extraocular Movements: Extraocular movements intact.     Conjunctiva/sclera: Conjunctivae normal.     Pupils: Pupils are equal, round, and reactive to light.  Cardiovascular:     Rate and Rhythm: Normal rate and regular rhythm.     Pulses: Normal pulses.     Heart sounds: Normal heart sounds. No murmur heard.    No friction rub. No gallop.  Pulmonary:     Effort: Pulmonary effort is normal. No  respiratory distress.     Breath sounds: Normal breath sounds. No stridor. No wheezing, rhonchi or rales.  Chest:     Chest wall: No tenderness.  Musculoskeletal:        General: Normal range of motion.     Cervical back: Normal range of motion and neck supple.  Skin:    General: Skin is warm and dry.     Capillary Refill: Capillary refill takes less than 2 seconds.     Coloration: Skin is not jaundiced or pale.     Findings: No bruising, erythema, lesion or rash.  Neurological:     General: No focal deficit present.     Mental Status: He is alert and oriented to person, place, and time. Mental status is at baseline.  Psychiatric:        Mood  and Affect: Mood normal.        Behavior: Behavior normal.        Thought Content: Thought content normal.        Judgment: Judgment normal.     Results for orders placed or performed in visit on 05/23/22  HM DIABETES EYE EXAM  Result Value Ref Range   HM Diabetic Eye Exam No Retinopathy No Retinopathy      Assessment & Plan:   Problem List Items Addressed This Visit       Endocrine   Type 2 diabetes mellitus with stage 1 chronic kidney disease, without long-term current use of insulin (HCC) - Primary    Not under good control with A1c of 7.7. Will start him on rybelsus and continue metformin. Call with any concerns. Continue to monitor.       Relevant Medications   losartan (COZAAR) 50 MG tablet   metFORMIN (GLUCOPHAGE) 500 MG tablet   rosuvastatin (CRESTOR) 40 MG tablet   Semaglutide (RYBELSUS) 3 MG TABS   Semaglutide (RYBELSUS) 7 MG TABS   Other Relevant Orders   Bayer DCA Hb A1c Waived   CBC with Differential/Platelet   Lipid Panel w/o Chol/HDL Ratio   Comprehensive metabolic panel     Genitourinary   Benign hypertensive renal disease    Not under good control on current regimen. Continue current regimen as we are starting rybelsus. May need to adjust medicine next visit if not better. Continue to monitor. Call with any concerns. Refills given. Labs drawn today.          Other   Hypercholesteremia    Under good control on current regimen. Continue current regimen. Continue to monitor. Call with any concerns. Refills given. Labs drawn today.       Relevant Medications   losartan (COZAAR) 50 MG tablet   rosuvastatin (CRESTOR) 40 MG tablet     Follow up plan: Return in about 3 months (around 09/12/2022).

## 2022-06-12 NOTE — Assessment & Plan Note (Addendum)
Not under good control on current regimen. Continue current regimen as we are starting rybelsus. May need to adjust medicine next visit if not better. Continue to monitor. Call with any concerns. Refills given. Labs drawn today.

## 2022-06-12 NOTE — Assessment & Plan Note (Signed)
Under good control on current regimen. Continue current regimen. Continue to monitor. Call with any concerns. Refills given. Labs drawn today.   

## 2022-06-13 LAB — COMPREHENSIVE METABOLIC PANEL
ALT: 33 IU/L (ref 0–44)
AST: 31 IU/L (ref 0–40)
Albumin/Globulin Ratio: 1.8
Albumin: 4.4 g/dL (ref 3.9–4.9)
Alkaline Phosphatase: 75 IU/L (ref 44–121)
BUN/Creatinine Ratio: 18 (ref 10–24)
BUN: 14 mg/dL (ref 8–27)
Bilirubin Total: 0.5 mg/dL (ref 0.0–1.2)
CO2: 20 mmol/L (ref 20–29)
Calcium: 9.1 mg/dL (ref 8.6–10.2)
Chloride: 102 mmol/L (ref 96–106)
Creatinine, Ser: 0.78 mg/dL (ref 0.76–1.27)
Globulin, Total: 2.5 g/dL (ref 1.5–4.5)
Glucose: 141 mg/dL — ABNORMAL HIGH (ref 70–99)
Potassium: 4.4 mmol/L (ref 3.5–5.2)
Sodium: 137 mmol/L (ref 134–144)
Total Protein: 6.9 g/dL (ref 6.0–8.5)
eGFR: 97 mL/min/{1.73_m2} (ref >59)

## 2022-06-13 LAB — CBC WITH DIFFERENTIAL/PLATELET
Basophils Absolute: 0.1 10*3/uL (ref 0.0–0.2)
Basos: 1 %
EOS (ABSOLUTE): 0.3 10*3/uL (ref 0.0–0.4)
Eos: 5 %
Hematocrit: 43.4 % (ref 37.5–51.0)
Hemoglobin: 14.5 g/dL (ref 13.0–17.7)
Immature Grans (Abs): 0 10*3/uL (ref 0.0–0.1)
Immature Granulocytes: 0 %
Lymphocytes Absolute: 2.2 10*3/uL (ref 0.7–3.1)
Lymphs: 36 %
MCH: 29.8 pg (ref 26.6–33.0)
MCHC: 33.4 g/dL (ref 31.5–35.7)
MCV: 89 fL (ref 79–97)
Monocytes Absolute: 0.5 10*3/uL (ref 0.1–0.9)
Monocytes: 8 %
Neutrophils Absolute: 3.2 10*3/uL (ref 1.4–7.0)
Neutrophils: 50 %
Platelets: 166 10*3/uL (ref 150–450)
RBC: 4.86 x10E6/uL (ref 4.14–5.80)
RDW: 13 % (ref 11.6–15.4)
WBC: 6.2 10*3/uL (ref 3.4–10.8)

## 2022-06-13 LAB — LIPID PANEL W/O CHOL/HDL RATIO
Cholesterol, Total: 118 mg/dL (ref 100–199)
HDL: 43 mg/dL (ref >39)
LDL Chol Calc (NIH): 46 mg/dL (ref 0–99)
Triglycerides: 176 mg/dL — ABNORMAL HIGH (ref 0–149)
VLDL Cholesterol Cal: 29 mg/dL (ref 5–40)

## 2022-06-27 ENCOUNTER — Other Ambulatory Visit: Payer: Self-pay | Admitting: Family Medicine

## 2022-06-27 NOTE — Telephone Encounter (Signed)
Unable to refill per protocol, Rx expired. Discontinued 06/12/22.  Requested Prescriptions  Pending Prescriptions Disp Refills   baclofen (LIORESAL) 10 MG tablet [Pharmacy Med Name: BACLOFEN 10 MG TABLET] 60 tablet 0    Sig: TAKE 1/2 TO 1 TABLET (5-10 MG TOTAL) BY MOUTH EVERY DAY AT BEDTIME AS NEEDED FOR MUSCLE SPASM     Analgesics:  Muscle Relaxants - baclofen Passed - 06/27/2022  2:18 AM      Passed - Cr in normal range and within 180 days    Creatinine  Date Value Ref Range Status  09/18/2012 0.78 0.60 - 1.30 mg/dL Final   Creatinine, Ser  Date Value Ref Range Status  06/12/2022 0.78 0.76 - 1.27 mg/dL Final         Passed - eGFR is 30 or above and within 180 days    EGFR (African American)  Date Value Ref Range Status  09/18/2012 >60  Final   GFR calc Af Amer  Date Value Ref Range Status  11/30/2019 107 >59 mL/min/1.73 Final    Comment:    **In accordance with recommendations from the NKF-ASN Task force,**   [facility] is in the process of updating its eGFR calculation to the   2021 CKD-EPI creatinine equation that estimates kidney function   without a race variable.    EGFR (Non-African Amer.)  Date Value Ref Range Status  09/18/2012 >60  Final    Comment:    eGFR values <47mL/min/1.73 m2 may be an indication of chronic kidney disease (CKD). Calculated eGFR is useful in patients with stable renal function. The eGFR calculation will not be reliable in acutely ill patients when serum creatinine is changing rapidly. It is not useful in  patients on dialysis. The eGFR calculation may not be applicable to patients at the low and high extremes of body sizes, pregnant women, and vegetarians.    GFR, Estimated  Date Value Ref Range Status  11/11/2021 >60 >60 mL/min Final    Comment:    (NOTE) Calculated using the CKD-EPI Creatinine Equation (2021)    eGFR  Date Value Ref Range Status  06/12/2022 97 >59 mL/min/1.73 Final         Passed - Valid encounter within last  6 months    Recent Outpatient Visits           2 weeks ago Type 2 diabetes mellitus with stage 1 chronic kidney disease, without long-term current use of insulin (HCC)   [place] [facility] [place], Megan P, DO   2 months ago Neck pain   [place] [facility] [place], Megan P, DO   3 months ago Benign hypertensive renal disease   [facility] [facility] [place], Megan P, DO   3 months ago Type 2 diabetes mellitus with stage 1 chronic kidney disease, without long-term current use of insulin (HCC)   [place] [facility] [place], Megan P, DO   4 months ago Neck pain   [place] [facility] [place], Corrie Dandy T, NP       Future Appointments             In 2 months [place], Oralia Rud, DO [facility], [facility]

## 2022-08-24 ENCOUNTER — Encounter: Payer: Self-pay | Admitting: Pulmonary Disease

## 2022-09-12 ENCOUNTER — Ambulatory Visit (INDEPENDENT_AMBULATORY_CARE_PROVIDER_SITE_OTHER): Payer: HMO | Admitting: Family Medicine

## 2022-09-12 ENCOUNTER — Encounter: Payer: Self-pay | Admitting: Family Medicine

## 2022-09-12 VITALS — BP 134/80 | HR 61 | Ht 67.0 in | Wt 256.2 lb

## 2022-09-12 DIAGNOSIS — E1122 Type 2 diabetes mellitus with diabetic chronic kidney disease: Secondary | ICD-10-CM

## 2022-09-12 DIAGNOSIS — Z7984 Long term (current) use of oral hypoglycemic drugs: Secondary | ICD-10-CM

## 2022-09-12 DIAGNOSIS — N181 Chronic kidney disease, stage 1: Secondary | ICD-10-CM

## 2022-09-12 LAB — BAYER DCA HB A1C WAIVED: HB A1C (BAYER DCA - WAIVED): 6.7 % — ABNORMAL HIGH (ref 4.8–5.6)

## 2022-09-12 MED ORDER — BACLOFEN 10 MG PO TABS: ORAL_TABLET | ORAL | 0 refills | Status: DC

## 2022-09-12 MED ORDER — MELOXICAM 15 MG PO TABS: 15.0000 mg | ORAL_TABLET | Freq: Every day | ORAL | 1 refills | Status: DC

## 2022-09-12 MED ORDER — RYBELSUS 7 MG PO TABS: 7.0000 mg | ORAL_TABLET | Freq: Every day | ORAL | 2 refills | Status: DC

## 2022-09-12 MED ORDER — RYBELSUS 3 MG PO TABS: 3.0000 mg | ORAL_TABLET | Freq: Every day | ORAL | Status: DC

## 2022-09-12 NOTE — Assessment & Plan Note (Signed)
Doing well with A1c of 6.7. Will restart his rybelsus and recheck in 3 months. Call with any concerns.

## 2022-09-12 NOTE — Progress Notes (Signed)
BP 134/80   Pulse 61   Ht 5\' 7"  (1.702 m)   Wt 256 lb 3.2 oz (116.2 kg)   SpO2 96%   BMI 40.13 kg/m    Subjective:    Patient ID: Robert Griffith, male    DOB: Sep 10, 1955, 67 y.o.   MRN: [id number]  HPI: Robert Griffith is a 68 y.o. male  Chief Complaint  Patient presents with   Diabetes    Patient says he has ran out of his samples of Rybelsus and he has lost 10-lbs and says he has put 5-lbs back on due to him running out the medication.    Flu Vaccine    Patient was offered Flu vaccine at today's visit and declined. Patient wants to hold off.    DIABETES- only took the 3mg  of his rybelsus and then stopped his medicine. Tolerating it well. No other concerns Hypoglycemic episodes:no Polydipsia/polyuria: no Visual disturbance: no Chest pain: no Paresthesias: no Glucose Monitoring: no  Accucheck frequency: Not Checking Taking Insulin?: no Blood Pressure Monitoring: not checking Retinal Examination: Up to Date Foot Exam: Up to Date Diabetic Education: Completed Pneumovax: Up to Date Influenza: Not up to Date Aspirin: yes  Relevant past medical, surgical, family and social history reviewed and updated as indicated. Interim medical history since our last visit reviewed. Allergies and medications reviewed and updated.  Review of Systems  Constitutional: Negative.   Respiratory: Negative.    Cardiovascular: Negative.   Musculoskeletal: Negative.   Neurological: Negative.   Psychiatric/Behavioral: Negative.      Per HPI unless specifically indicated above     Objective:    BP 134/80   Pulse 61   Ht 5\' 7"  (1.702 m)   Wt 256 lb 3.2 oz (116.2 kg)   SpO2 96%   BMI 40.13 kg/m   Wt Readings from Last 3 Encounters:  09/12/22 256 lb 3.2 oz (116.2 kg)  06/12/22 259 lb 12.8 oz (117.8 kg)  04/23/22 260 lb 11.2 oz (118.3 kg)    Physical Exam Vitals and nursing note reviewed.  Constitutional:      General: He is not in acute distress.    Appearance: Normal  appearance. He is obese. He is not ill-appearing, toxic-appearing or diaphoretic.  HENT:     Head: Normocephalic and atraumatic.     Right Ear: External ear normal.     Left Ear: External ear normal.     Nose: Nose normal.     Mouth/Throat:     Mouth: Mucous membranes are moist.     Pharynx: Oropharynx is clear.  Eyes:     General: No scleral icterus.       Right eye: No discharge.        Left eye: No discharge.     Extraocular Movements: Extraocular movements intact.     Conjunctiva/sclera: Conjunctivae normal.     Pupils: Pupils are equal, round, and reactive to light.  Cardiovascular:     Rate and Rhythm: Normal rate and regular rhythm.     Pulses: Normal pulses.     Heart sounds: Normal heart sounds. No murmur heard.    No friction rub. No gallop.  Pulmonary:     Effort: Pulmonary effort is normal. No respiratory distress.     Breath sounds: Normal breath sounds. No stridor. No wheezing, rhonchi or rales.  Chest:     Chest wall: No tenderness.  Musculoskeletal:        General: Normal range of motion.  Cervical back: Normal range of motion and neck supple.  Skin:    General: Skin is warm and dry.     Capillary Refill: Capillary refill takes less than 2 seconds.     Coloration: Skin is not jaundiced or pale.     Findings: No bruising, erythema, lesion or rash.  Neurological:     General: No focal deficit present.     Mental Status: He is alert and oriented to person, place, and time. Mental status is at baseline.  Psychiatric:        Mood and Affect: Mood normal.        Behavior: Behavior normal.        Thought Content: Thought content normal.        Judgment: Judgment normal.     Results for orders placed or performed in visit on 06/12/22  Bayer DCA Hb A1c Waived  Result Value Ref Range   HB A1C (BAYER DCA - WAIVED) 7.7 (H) 4.8 - 5.6 %  CBC with Differential/Platelet  Result Value Ref Range   WBC 6.2 3.4 - 10.8 x10E3/uL   RBC 4.86 4.14 - 5.80 x10E6/uL    Hemoglobin 14.5 13.0 - 17.7 g/dL   Hematocrit 16.1 09.6 - 51.0 %   MCV 89 79 - 97 fL   MCH 29.8 26.6 - 33.0 pg   MCHC 33.4 31.5 - 35.7 g/dL   RDW 04.5 40.9 - 81.1 %   Platelets 166 150 - 450 x10E3/uL   Neutrophils 50 Not Estab. %   Lymphs 36 Not Estab. %   Monocytes 8 Not Estab. %   Eos 5 Not Estab. %   Basos 1 Not Estab. %   Neutrophils Absolute 3.2 1.4 - 7.0 x10E3/uL   Lymphocytes Absolute 2.2 0.7 - 3.1 x10E3/uL   Monocytes Absolute 0.5 0.1 - 0.9 x10E3/uL   EOS (ABSOLUTE) 0.3 0.0 - 0.4 x10E3/uL   Basophils Absolute 0.1 0.0 - 0.2 x10E3/uL   Immature Granulocytes 0 Not Estab. %   Immature Grans (Abs) 0.0 0.0 - 0.1 x10E3/uL  Lipid Panel w/o Chol/HDL Ratio  Result Value Ref Range   Cholesterol, Total 118 100 - 199 mg/dL   Triglycerides 914 (H) 0 - 149 mg/dL   HDL 43 >78 mg/dL   VLDL Cholesterol Cal 29 5 - 40 mg/dL   LDL Chol Calc (NIH) 46 0 - 99 mg/dL  Comprehensive metabolic panel  Result Value Ref Range   Glucose 141 (H) 70 - 99 mg/dL   BUN 14 8 - 27 mg/dL   Creatinine, Ser 2.95 0.76 - 1.27 mg/dL   eGFR 97 >62 ZH/YQM/5.78   BUN/Creatinine Ratio 18 10 - 24   Sodium 137 134 - 144 mmol/L   Potassium 4.4 3.5 - 5.2 mmol/L   Chloride 102 96 - 106 mmol/L   CO2 20 20 - 29 mmol/L   Calcium 9.1 8.6 - 10.2 mg/dL   Total Protein 6.9 6.0 - 8.5 g/dL   Albumin 4.4 3.9 - 4.9 g/dL   Globulin, Total 2.5 1.5 - 4.5 g/dL   Albumin/Globulin Ratio 1.8    Bilirubin Total 0.5 0.0 - 1.2 mg/dL   Alkaline Phosphatase 75 44 - 121 IU/L   AST 31 0 - 40 IU/L   ALT 33 0 - 44 IU/L      Assessment & Plan:   Problem List Items Addressed This Visit       Endocrine   Type 2 diabetes mellitus with stage 1 chronic kidney disease, without long-term  current use of insulin (HCC) - Primary    Doing well with A1c of 6.7. Will restart his rybelsus and recheck in 3 months. Call with any concerns.       Relevant Medications   Semaglutide (RYBELSUS) 7 MG TABS   Semaglutide (RYBELSUS) 3 MG TABS   Other  Relevant Orders   Bayer DCA Hb A1c Waived     Follow up plan: Return in about 3 months (around 12/12/2022).

## 2022-12-12 ENCOUNTER — Ambulatory Visit (INDEPENDENT_AMBULATORY_CARE_PROVIDER_SITE_OTHER): Payer: HMO | Admitting: Family Medicine

## 2022-12-12 ENCOUNTER — Encounter: Payer: Self-pay | Admitting: Family Medicine

## 2022-12-12 ENCOUNTER — Other Ambulatory Visit: Payer: Self-pay | Admitting: Family Medicine

## 2022-12-12 VITALS — BP 136/82 | HR 73 | Ht 67.0 in | Wt 252.4 lb

## 2022-12-12 DIAGNOSIS — N181 Chronic kidney disease, stage 1: Secondary | ICD-10-CM | POA: Diagnosis not present

## 2022-12-12 DIAGNOSIS — Z7984 Long term (current) use of oral hypoglycemic drugs: Secondary | ICD-10-CM | POA: Diagnosis not present

## 2022-12-12 DIAGNOSIS — E1122 Type 2 diabetes mellitus with diabetic chronic kidney disease: Secondary | ICD-10-CM | POA: Diagnosis not present

## 2022-12-12 DIAGNOSIS — E7800 Pure hypercholesterolemia, unspecified: Secondary | ICD-10-CM | POA: Diagnosis not present

## 2022-12-12 DIAGNOSIS — R413 Other amnesia: Secondary | ICD-10-CM

## 2022-12-12 DIAGNOSIS — I129 Hypertensive chronic kidney disease with stage 1 through stage 4 chronic kidney disease, or unspecified chronic kidney disease: Secondary | ICD-10-CM

## 2022-12-12 LAB — BAYER DCA HB A1C WAIVED: HB A1C (BAYER DCA - WAIVED): 6.6 % — ABNORMAL HIGH (ref 4.8–5.6)

## 2022-12-12 LAB — MICROALBUMIN, URINE WAIVED
Creatinine, Urine Waived: 100 mg/dL (ref 10–300)
Microalb, Ur Waived: 30 mg/L — ABNORMAL HIGH (ref 0–19)
Microalb/Creat Ratio: <30 mg/g (ref <30)

## 2022-12-12 MED ORDER — RYBELSUS 7 MG PO TABS: 7.0000 mg | ORAL_TABLET | Freq: Every day | ORAL | 1 refills | Status: DC

## 2022-12-12 MED ORDER — LOSARTAN POTASSIUM 50 MG PO TABS: 75.0000 mg | ORAL_TABLET | Freq: Every day | ORAL | 1 refills | Status: DC

## 2022-12-12 MED ORDER — METFORMIN HCL 500 MG PO TABS: 1000.0000 mg | ORAL_TABLET | Freq: Two times a day (BID) | ORAL | 1 refills | Status: DC

## 2022-12-12 MED ORDER — MONTELUKAST SODIUM 10 MG PO TABS: 10.0000 mg | ORAL_TABLET | Freq: Every day | ORAL | 1 refills | Status: DC

## 2022-12-12 MED ORDER — ROSUVASTATIN CALCIUM 40 MG PO TABS: 40.0000 mg | ORAL_TABLET | Freq: Every day | ORAL | 1 refills | Status: DC

## 2022-12-12 MED ORDER — BACLOFEN 10 MG PO TABS: ORAL_TABLET | ORAL | 0 refills | Status: DC

## 2022-12-12 NOTE — Assessment & Plan Note (Signed)
Under good control on current regimen. Continue current regimen. Continue to monitor. Call with any concerns. Refills given. Labs drawn today.   

## 2022-12-12 NOTE — Assessment & Plan Note (Signed)
Doing great with A1c of 6.6. Continue current regimen. Continue to monitor. Call with any concerns.

## 2022-12-12 NOTE — Assessment & Plan Note (Signed)
Encouraged diet and exercise with goal of losing 1-2lbs per week. Congratulated patient on 7lb weight loss in the last 6 months. Continue to monitor.

## 2022-12-12 NOTE — Progress Notes (Signed)
BP 136/82   Pulse 73   Ht 5\' 7"  (1.702 m)   Wt 252 lb 6.4 oz (114.5 kg)   SpO2 95%   BMI 39.53 kg/m    Subjective:    Patient ID: Robert Griffith, male    DOB: 27-Aug-1955, 67 y.o.   MRN: [id number]  HPI: Robert Griffith is a 67 y.o. male  Chief Complaint  Patient presents with   Diabetes   Brain Fog    Patient says he has noticed some times he has issues with brain fogs such as remember "what someone's name is and waking up not remembering what day it is." Patient says he is not sure if it is him not paying enough attention, and says he has friend that has issues with brain fog and wanted to discuss at today's visit.    Has been having some issues with his memory. He notes that he has been forgetting people's names and things he's supposed to do. He notes that these are people who he should be remembering. He sometimes forgets what day it is. He notes that he has a lot of stuff that he's responsible.   DIABETES Hypoglycemic episodes:no Polydipsia/polyuria: no Visual disturbance: no Chest pain: no Paresthesias: no Glucose Monitoring: yes  Accucheck frequency: Daily Taking Insulin?: no Blood Pressure Monitoring: rarely Retinal Examination: Up to Date Foot Exam: Up to Date Diabetic Education: Completed Pneumovax: Up to Date Influenza: Up to Date Aspirin: yes  HYPERTENSION / HYPERLIPIDEMIA Satisfied with current treatment? yes Duration of hypertension: chronic BP monitoring frequency: not checking BP medication side effects: no Past BP meds: losartan Duration of hyperlipidemia: chronic Cholesterol medication side effects: no Cholesterol supplements: none Past cholesterol medications: crestor Medication compliance: excellent compliance Aspirin: yes Recent stressors: no Recurrent headaches: no Visual changes: no Palpitations: no Dyspnea: no Chest pain: no Lower extremity edema: no Dizzy/lightheaded: no     12/12/2022    8:33 AM  MMSE - Mini Mental State  Exam  Orientation to time 5  Orientation to Place 5  Registration 3  Attention/ Calculation 5  Recall 3  Language- name 2 objects 2  Language- repeat 1  Language- follow 3 step command 3  Language- read & follow direction 1  Write a sentence 1  Copy design 1  Total score 30    Relevant past medical, surgical, family and social history reviewed and updated as indicated. Interim medical history since our last visit reviewed. Allergies and medications reviewed and updated.  Review of Systems  Constitutional: Negative.   Respiratory: Negative.    Cardiovascular: Negative.   Gastrointestinal: Negative.   Musculoskeletal: Negative.   Neurological: Negative.   Psychiatric/Behavioral: Negative.      Per HPI unless specifically indicated above     Objective:    BP 136/82   Pulse 73   Ht 5\' 7"  (1.702 m)   Wt 252 lb 6.4 oz (114.5 kg)   SpO2 95%   BMI 39.53 kg/m   Wt Readings from Last 3 Encounters:  12/12/22 252 lb 6.4 oz (114.5 kg)  09/12/22 256 lb 3.2 oz (116.2 kg)  06/12/22 259 lb 12.8 oz (117.8 kg)    Physical Exam Vitals and nursing note reviewed.  Constitutional:      General: He is not in acute distress.    Appearance: Normal appearance. He is not ill-appearing, toxic-appearing or diaphoretic.  HENT:     Head: Normocephalic and atraumatic.     Right Ear: External ear normal.  Left Ear: External ear normal.     Nose: Nose normal.     Mouth/Throat:     Mouth: Mucous membranes are moist.     Pharynx: Oropharynx is clear.  Eyes:     General: No scleral icterus.       Right eye: No discharge.        Left eye: No discharge.     Extraocular Movements: Extraocular movements intact.     Conjunctiva/sclera: Conjunctivae normal.     Pupils: Pupils are equal, round, and reactive to light.  Cardiovascular:     Rate and Rhythm: Normal rate and regular rhythm.     Pulses: Normal pulses.     Heart sounds: Normal heart sounds. No murmur heard.    No friction rub. No  gallop.  Pulmonary:     Effort: Pulmonary effort is normal. No respiratory distress.     Breath sounds: Normal breath sounds. No stridor. No wheezing, rhonchi or rales.  Chest:     Chest wall: No tenderness.  Musculoskeletal:        General: Normal range of motion.     Cervical back: Normal range of motion and neck supple.  Skin:    General: Skin is warm and dry.     Capillary Refill: Capillary refill takes less than 2 seconds.     Coloration: Skin is not jaundiced or pale.     Findings: No bruising, erythema, lesion or rash.  Neurological:     General: No focal deficit present.     Mental Status: He is alert and oriented to person, place, and time. Mental status is at baseline.  Psychiatric:        Mood and Affect: Mood normal.        Behavior: Behavior normal.        Thought Content: Thought content normal.        Judgment: Judgment normal.     Results for orders placed or performed in visit on 09/12/22  Bayer DCA Hb A1c Waived  Result Value Ref Range   HB A1C (BAYER DCA - WAIVED) 6.7 (H) 4.8 - 5.6 %      Assessment & Plan:   Problem List Items Addressed This Visit       Endocrine   Type 2 diabetes mellitus with stage 1 chronic kidney disease, without long-term current use of insulin (HCC) - Primary    Doing great with A1c of 6.6. Continue current regimen. Continue to monitor. Call with any concerns.       Relevant Medications   losartan (COZAAR) 50 MG tablet   metFORMIN (GLUCOPHAGE) 500 MG tablet   rosuvastatin (CRESTOR) 40 MG tablet   Semaglutide (RYBELSUS) 7 MG TABS   Other Relevant Orders   Bayer DCA Hb A1c Waived   CBC with Differential/Platelet   Comprehensive metabolic panel   Microalbumin, Urine Waived     Genitourinary   Benign hypertensive renal disease    Under good control on current regimen. Continue current regimen. Continue to monitor. Call with any concerns. Refills given. Labs drawn today.        Relevant Orders   CBC with  Differential/Platelet   Comprehensive metabolic panel   Microalbumin, Urine Waived     Other   Hypercholesteremia    Under good control on current regimen. Continue current regimen. Continue to monitor. Call with any concerns. Refills given. Labs drawn today.       Relevant Medications   losartan (COZAAR) 50 MG tablet  rosuvastatin (CRESTOR) 40 MG tablet   Other Relevant Orders   CBC with Differential/Platelet   Comprehensive metabolic panel   Lipid Panel w/o Chol/HDL Ratio   Morbid obesity (HCC)    Encouraged diet and exercise with goal of losing 1-2lbs per week. Congratulated patient on 7lb weight loss in the last 6 months. Continue to monitor.       Relevant Medications   metFORMIN (GLUCOPHAGE) 500 MG tablet   Semaglutide (RYBELSUS) 7 MG TABS   Other Visit Diagnoses     Memory change       Discussed memory games and writing things down. Offered referral to neurology- would like to hold now. Will check TSH and B12. Call with any concerns.   Relevant Orders   TSH   B12        Follow up plan: Return in about 3 months (around 03/12/2023) for physical.

## 2022-12-13 LAB — COMPREHENSIVE METABOLIC PANEL
ALT: 33 [IU]/L (ref 0–44)
AST: 35 [IU]/L (ref 0–40)
Albumin: 4.2 g/dL (ref 3.9–4.9)
Alkaline Phosphatase: 63 [IU]/L (ref 44–121)
BUN/Creatinine Ratio: 15 (ref 10–24)
BUN: 12 mg/dL (ref 8–27)
Bilirubin Total: 0.4 mg/dL (ref 0.0–1.2)
CO2: 21 mmol/L (ref 20–29)
Calcium: 9 mg/dL (ref 8.6–10.2)
Chloride: 104 mmol/L (ref 96–106)
Creatinine, Ser: 0.82 mg/dL (ref 0.76–1.27)
Globulin, Total: 2.6 g/dL (ref 1.5–4.5)
Glucose: 121 mg/dL — ABNORMAL HIGH (ref 70–99)
Potassium: 4.5 mmol/L (ref 3.5–5.2)
Sodium: 140 mmol/L (ref 134–144)
Total Protein: 6.8 g/dL (ref 6.0–8.5)
eGFR: 96 mL/min/{1.73_m2} (ref >59)

## 2022-12-13 LAB — LIPID PANEL W/O CHOL/HDL RATIO
Cholesterol, Total: 116 mg/dL (ref 100–199)
HDL: 38 mg/dL — ABNORMAL LOW (ref >39)
LDL Chol Calc (NIH): 54 mg/dL (ref 0–99)
Triglycerides: 138 mg/dL (ref 0–149)
VLDL Cholesterol Cal: 24 mg/dL (ref 5–40)

## 2022-12-13 LAB — CBC WITH DIFFERENTIAL/PLATELET
Basophils Absolute: 0 10*3/uL (ref 0.0–0.2)
Basos: 1 %
EOS (ABSOLUTE): 0.3 10*3/uL (ref 0.0–0.4)
Eos: 4 %
Hematocrit: 45.2 % (ref 37.5–51.0)
Hemoglobin: 15 g/dL (ref 13.0–17.7)
Immature Grans (Abs): 0 10*3/uL (ref 0.0–0.1)
Immature Granulocytes: 0 %
Lymphocytes Absolute: 2.5 10*3/uL (ref 0.7–3.1)
Lymphs: 36 %
MCH: 29.9 pg (ref 26.6–33.0)
MCHC: 33.2 g/dL (ref 31.5–35.7)
MCV: 90 fL (ref 79–97)
Monocytes Absolute: 0.5 10*3/uL (ref 0.1–0.9)
Monocytes: 7 %
Neutrophils Absolute: 3.6 10*3/uL (ref 1.4–7.0)
Neutrophils: 52 %
Platelets: 186 10*3/uL (ref 150–450)
RBC: 5.01 x10E6/uL (ref 4.14–5.80)
RDW: 13.1 % (ref 11.6–15.4)
WBC: 7 10*3/uL (ref 3.4–10.8)

## 2022-12-13 LAB — VITAMIN B12: Vitamin B-12: 345 pg/mL (ref 232–1245)

## 2022-12-13 LAB — TSH: TSH: 4.72 u[IU]/mL — ABNORMAL HIGH (ref 0.450–4.500)

## 2023-01-15 ENCOUNTER — Ambulatory Visit: Payer: HMO | Admitting: Emergency Medicine

## 2023-01-15 VITALS — Ht 68.0 in | Wt 240.0 lb

## 2023-01-15 DIAGNOSIS — Z0000 Encounter for general adult medical examination without abnormal findings: Secondary | ICD-10-CM | POA: Diagnosis not present

## 2023-01-15 NOTE — Patient Instructions (Addendum)
 Robert Griffith , Thank you for taking time to come for your Medicare Wellness Visit. I appreciate your ongoing commitment to your health goals. Please review the following plan we discussed and let me know if I can assist you in the future.   Referrals/Orders/Follow-Ups/Clinician Recommendations: Keep up the good work!!  This is a list of the screening recommended for you and due dates:  Health Maintenance  Topic Date Due   COVID-19 Vaccine (4 - 2024-25 season) 09/02/2022   Eye exam for diabetics  05/22/2023   Hemoglobin A1C  06/12/2023   Complete foot exam   09/12/2023   Yearly kidney function blood test for diabetes  12/12/2023   Yearly kidney health urinalysis for diabetes  12/12/2023   Medicare Annual Wellness Visit  01/15/2024   Colon Cancer Screening  09/13/2027   DTaP/Tdap/Td vaccine (3 - Td or Tdap) 09/29/2030   Pneumonia Vaccine  Completed   Flu Shot  Completed   Hepatitis C Screening  Completed   Zoster (Shingles) Vaccine  Completed   HPV Vaccine  Aged Out    Advanced directives: (Copy Requested) Please bring a copy of your health care power of attorney and living will to the office to be added to your chart at your convenience.  Next Medicare Annual Wellness Visit scheduled for next year: Yes, 01/21/24 @ 9:20am (video visit)

## 2023-01-15 NOTE — Progress Notes (Signed)
 Subjective:   Robert Griffith is a 68 y.o. male who presents for Medicare Annual/Subsequent preventive examination.  This patient declined Interactive audio and acupuncturist. Therefore the visit was completed with audio only.   Visit Complete: Virtual I connected with  Robert Griffith on 01/15/23 by a audio enabled telemedicine application and verified that I am speaking with the correct person using two identifiers.  Patient Location: Home  Provider Location: Office/Clinic  I discussed the limitations of evaluation and management by telemedicine. The patient expressed understanding and agreed to proceed.  Vital Signs: Because this visit was a virtual/telehealth visit, some criteria may be missing or patient reported. Any vitals not documented were not able to be obtained and vitals that have been documented are patient reported.   Cardiac Risk Factors include: advanced age (>105men, >53 women);male gender;diabetes mellitus;dyslipidemia;obesity (BMI >30kg/m2)     Objective:    Today's Vitals   01/15/23 0912  Weight: 240 lb (108.9 kg)  Height: 5' 8 (1.727 m)   Body mass index is 36.49 kg/m.     01/15/2023    9:21 AM 11/11/2021   10:32 PM 09/12/2020    7:56 AM  Advanced Directives  Does Patient Have a Medical Advance Directive? Yes No Yes  Type of Estate Agent of [place];Living will  Healthcare Power of [place];Living will  Does patient want to make changes to medical advance directive? No - Patient declined  No - Patient declined  Copy of Healthcare Power of Attorney in Chart? No - copy requested  No - copy requested    Current Medications (verified) Outpatient Encounter Medications as of 01/15/2023  Medication Sig   aspirin 81 MG EC tablet Take 81 mg by mouth daily.     baclofen  (LIORESAL ) 10 MG tablet TAKE 1/2 TO 1 TABLET (5-10 MG TOTAL) BY MOUTH EVERY DAY AT BEDTIME AS NEEDED FOR MUSCLE SPASM   glucose blood test strip Use as  instructed   losartan  (COZAAR ) 50 MG tablet Take 1.5 tablets (75 mg total) by mouth daily.   meloxicam  (MOBIC ) 15 MG tablet Take 1 tablet (15 mg total) by mouth daily.   metFORMIN  (GLUCOPHAGE ) 500 MG tablet Take 2 tablets (1,000 mg total) by mouth 2 (two) times daily with a meal.   montelukast  (SINGULAIR ) 10 MG tablet Take 1 tablet (10 mg total) by mouth at bedtime.   ONE TOUCH ULTRA TEST test strip USE TO TEST BLOOD SUGAR DAILY (EX E11.9)   rosuvastatin  (CRESTOR ) 40 MG tablet Take 1 tablet (40 mg total) by mouth daily.   Semaglutide  (RYBELSUS ) 7 MG TABS Take 1 tablet (7 mg total) by mouth daily.   lidocaine  (LIDODERM ) 5 % Place 1 patch onto the skin daily. Remove & Discard patch within 12 hours or as directed by MD (Patient not taking: Reported on 01/15/2023)   No facility-administered encounter medications on file as of 01/15/2023.    Allergies (verified) Other   History: Past Medical History:  Diagnosis Date   Diabetes mellitus without complication (HCC)    Hyperlipidemia    Hypertension    Kidney stone    Obesity    Restless legs    Wears dentures    Has full upper and lower, only wears upper   Past Surgical History:  Procedure Laterality Date   COLONOSCOPY N/A 09/12/2020   Procedure: COLONOSCOPY;  Surgeon: Jinny Carmine, MD;  Location: [facility];  Service: Endoscopy;  Laterality: N/A;  Diabetic   left toe surgery  POLYPECTOMY N/A 09/12/2020   Procedure: POLYPECTOMY;  Surgeon: Jinny Carmine, MD;  Location: [facility];  Service: Endoscopy;  Laterality: N/A;   sleep apnea surgery     TONSILLECTOMY AND ADENOIDECTOMY     Family History  Problem Relation Age of Onset   Heart attack Mother    Heart disease Mother    Heart disease Father    Stroke Brother    Coronary artery disease Other        4 family members   Social History   Socioeconomic History   Marital status: Married    Spouse name: Sharlet   Number of children: Not on file   Years of  education: Not on file   Highest education level: Not on file  Occupational History   Occupation: retired  Tobacco Use   Smoking status: Never   Smokeless tobacco: Never  Vaping Use   Vaping status: Never Used  Substance and Sexual Activity   Alcohol use: No   Drug use: No   Sexual activity: Not on file  Other Topics Concern   Not on file  Social History Narrative   2 step-daughter, 4 grandchildren   No formal exercise, but stays active   Social Drivers of Health   Financial Resource Strain: Low Risk  (01/15/2023)   Overall Financial Resource Strain (CARDIA)    Difficulty of Paying Living Expenses: Not hard at all  Food Insecurity: No Food Insecurity (01/15/2023)   Hunger Vital Sign    Worried About Running Out of Food in the Last Year: Never true    Ran Out of Food in the Last Year: Never true  Transportation Needs: No Transportation Needs (01/15/2023)   PRAPARE - Administrator, Civil Service (Medical): No    Lack of Transportation (Non-Medical): No  Physical Activity: Inactive (01/15/2023)   Exercise Vital Sign    Days of Exercise per Week: 0 days    Minutes of Exercise per Session: 0 min  Stress: No Stress Concern Present (01/15/2023)   [employer] of Occupational Health - Occupational Stress Questionnaire    Feeling of Stress : Not at all  Social Connections: Socially Integrated (01/15/2023)   Social Connection and Isolation Panel [NHANES]    Frequency of Communication with Friends and Family: More than three times a week    Frequency of Social Gatherings with Friends and Family: More than three times a week    Attends Religious Services: More than 4 times per year    Active Member of [employer] or Organizations: Yes    Attends Engineer, Structural: More than 4 times per year    Marital Status: Married    Tobacco Counseling Counseling given: Not Answered   Clinical Intake:  Pre-visit preparation completed: Yes  Pain : No/denies pain      BMI - recorded: 36.49 Nutritional Status: BMI > 30  Obese Nutritional Risks: None Diabetes: Yes CBG done?: No Did pt. bring in CBG monitor from home?: No  How often do you need to have someone help you when you read instructions, pamphlets, or other written materials from your doctor or pharmacy?: 1 - Never  Interpreter Needed?: No  Information entered by :: Vina Ned, CMA   Activities of Daily Living    01/15/2023    9:14 AM  In your present state of health, do you have any difficulty performing the following activities:  Hearing? 1  Comment wears hearing aids  Vision? 0  Difficulty concentrating or making  decisions? 0  Walking or climbing stairs? 0  Dressing or bathing? 0  Doing errands, shopping? 0  Preparing Food and eating ? N  Using the Toilet? N  In the past six months, have you accidently leaked urine? N  Do you have problems with loss of bowel control? N  Managing your Medications? N  Managing your Finances? N  Housekeeping or managing your Housekeeping? N    Patient Care Team: Vicci Duwaine SQUIBB, DO as PCP - General (Family Medicine) Gollan, Timothy J, MD as Consulting Physician (Cardiology)  Indicate any recent Medical Services you may have received from other than [facility] providers in the past year (date may be approximate).     Assessment:   This is a routine wellness examination for [place].  Hearing/Vision screen Hearing Screening - Comments:: Has hearing aids, doesn't wear all the time Vision Screening - Comments:: Gets eye exams   Goals Addressed             This Visit's Progress    Weight (lb) < 220 lb (99.8 kg)   240 lb (108.9 kg)     Depression Screen    01/15/2023    9:19 AM 12/12/2022    8:31 AM 09/12/2022    9:37 AM 06/12/2022    8:59 AM 03/26/2022    2:28 PM 02/21/2022    2:23 PM 02/12/2022    4:01 PM  PHQ 2/9 Scores  PHQ - 2 Score 0 0 0 0 0 0 0  PHQ- 9 Score 0 0 0 0 0 0 0    Fall Risk    01/15/2023    9:21 AM 12/12/2022     8:31 AM 09/12/2022    9:37 AM 02/21/2022    2:23 PM 02/12/2022    4:01 PM  Fall Risk   Falls in the past year? 0 0 0 0 0  Number falls in past yr: 0 0 0 0 0  Injury with Fall? 0 0 0 0 0  Risk for fall due to : No Fall Risks No Fall Risks No Fall Risks No Fall Risks No Fall Risks  Follow up Falls prevention discussed Falls evaluation completed Falls evaluation completed Falls evaluation completed Falls evaluation completed    MEDICARE RISK AT HOME: Medicare Risk at Home Any stairs in or around the home?: Yes If so, are there any without handrails?: No Home free of loose throw rugs in walkways, pet beds, electrical cords, etc?: Yes Adequate lighting in your home to reduce risk of falls?: Yes Life alert?: No Use of a cane, walker or w/c?: No Grab bars in the bathroom?: Yes Shower chair or bench in shower?: Yes Elevated toilet seat or a handicapped toilet?: Yes  TIMED UP AND GO:  Was the test performed?  No    Cognitive Function:    12/12/2022    8:33 AM  MMSE - Mini Mental State Exam  Orientation to time 5  Orientation to Place 5  Registration 3  Attention/ Calculation 5  Recall 3  Language- name 2 objects 2  Language- repeat 1  Language- follow 3 step command 3  Language- read & follow direction 1  Write a sentence 1  Copy design 1  Total score 30        01/15/2023    9:22 AM 12/15/2021    8:38 AM  6CIT Screen  What Year? 0 points 0 points  What month? 0 points 0 points  What time? 0 points 0 points  Count  back from 20 0 points 0 points  Months in reverse 0 points 0 points  Repeat phrase 0 points 0 points  Total Score 0 points 0 points    Immunizations Immunization History  Administered Date(s) Administered   Fluad Quad(high Dose 65+) 11/30/2019, 09/28/2020, 10/01/2021   Influenza, High Dose Seasonal PF 09/18/2022   Influenza,inj,Quad PF,6+ Mos 09/19/2016, 10/03/2018   Influenza-Unspecified 11/01/2014, 11/03/2015, 10/01/2017   Moderna Sars-Covid-2  Vaccination 02/23/2019, 03/24/2019, 01/15/2020   Pneumococcal Conjugate-13 05/20/2019   Pneumococcal Polysaccharide-23 09/07/2021   Pneumococcal-Unspecified 04/13/2010   Tdap 06/02/2010, 09/28/2020   Zoster Recombinant(Shingrix ) 11/14/2018, 09/08/2019    TDAP status: Up to date  Flu Vaccine status: Up to date  Pneumococcal vaccine status: Up to date  Covid-19 vaccine status: Declined, Education has been provided regarding the importance of this vaccine but patient still declined. Advised may receive this vaccine at local pharmacy or Health Dept.or vaccine clinic. Aware to provide a copy of the vaccination record if obtained from local pharmacy or Health Dept. Verbalized acceptance and understanding.  Qualifies for Shingles Vaccine? Yes   Zostavax completed No   Shingrix  Completed?: Yes  Screening Tests Health Maintenance  Topic Date Due   COVID-19 Vaccine (4 - 2024-25 season) 09/02/2022   OPHTHALMOLOGY EXAM  05/22/2023   HEMOGLOBIN A1C  06/12/2023   FOOT EXAM  09/12/2023   Diabetic kidney evaluation - eGFR measurement  12/12/2023   Diabetic kidney evaluation - Urine ACR  12/12/2023   Medicare Annual Wellness (AWV)  01/15/2024   Colonoscopy  09/13/2027   DTaP/Tdap/Td (3 - Td or Tdap) 09/29/2030   Pneumonia Vaccine 81+ Years old  Completed   INFLUENZA VACCINE  Completed   Hepatitis C Screening  Completed   Zoster Vaccines- Shingrix   Completed   HPV VACCINES  Aged Out    Health Maintenance  Health Maintenance Due  Topic Date Due   COVID-19 Vaccine (4 - 2024-25 season) 09/02/2022    Colorectal cancer screening: Type of screening: Colonoscopy. Completed 09/12/20. Repeat every 7 years  Lung Cancer Screening: (Low Dose CT Chest recommended if Age 39-80 years, 20 pack-year currently smoking OR have quit w/in 15years.) does not qualify.   Lung Cancer Screening Referral: n/a  Additional Screening:  Hepatitis C Screening: does not qualify; Completed 02/01/15  Vision  Screening: Recommended annual ophthalmology exams for early detection of glaucoma and other disorders of the eye. Is the patient up to date with their annual eye exam?  Yes  Who is the provider or what is the name of the office in which the patient attends annual eye exams? Dr. Francis Mallick If pt is not established with a provider, would they like to be referred to a provider to establish care? No .   Dental Screening: Recommended annual dental exams for proper oral hygiene  Diabetic Foot Exam: Diabetic Foot Exam: Completed 09/12/22  Community Resource Referral / Chronic Care Management: CRR required this visit?  No   CCM required this visit?  No     Plan:     I have personally reviewed and noted the following in the patient's chart:   Medical and social history Use of alcohol, tobacco or illicit drugs  Current medications and supplements including opioid prescriptions. Patient is not currently taking opioid prescriptions. Functional ability and status Nutritional status Physical activity Advanced directives List of other physicians Hospitalizations, surgeries, and ER visits in previous 12 months Vitals Screenings to include cognitive, depression, and falls Referrals and appointments  In addition, I have reviewed  and discussed with patient certain preventive protocols, quality metrics, and best practice recommendations. A written personalized care plan for preventive services as well as general preventive health recommendations were provided to patient.     Vina Ned, CMA   01/15/2023   After Visit Summary: (MyChart) Due to this being a telephonic visit, the after visit summary with patients personalized plan was offered to patient via MyChart   Nurse Notes:  Declined DM & nutrition education Declined covid vaccine

## 2023-01-26 DIAGNOSIS — E119 Type 2 diabetes mellitus without complications: Secondary | ICD-10-CM | POA: Diagnosis not present

## 2023-01-26 DIAGNOSIS — S0502XA Injury of conjunctiva and corneal abrasion without foreign body, left eye, initial encounter: Secondary | ICD-10-CM | POA: Diagnosis not present

## 2023-01-26 DIAGNOSIS — Z7984 Long term (current) use of oral hypoglycemic drugs: Secondary | ICD-10-CM | POA: Diagnosis not present

## 2023-01-26 DIAGNOSIS — H2513 Age-related nuclear cataract, bilateral: Secondary | ICD-10-CM | POA: Diagnosis not present

## 2023-01-30 DIAGNOSIS — E119 Type 2 diabetes mellitus without complications: Secondary | ICD-10-CM | POA: Diagnosis not present

## 2023-01-30 DIAGNOSIS — H2513 Age-related nuclear cataract, bilateral: Secondary | ICD-10-CM | POA: Diagnosis not present

## 2023-01-30 DIAGNOSIS — Z7984 Long term (current) use of oral hypoglycemic drugs: Secondary | ICD-10-CM | POA: Diagnosis not present

## 2023-01-30 DIAGNOSIS — S0502XA Injury of conjunctiva and corneal abrasion without foreign body, left eye, initial encounter: Secondary | ICD-10-CM | POA: Diagnosis not present

## 2023-01-31 DIAGNOSIS — H2513 Age-related nuclear cataract, bilateral: Secondary | ICD-10-CM | POA: Diagnosis not present

## 2023-01-31 DIAGNOSIS — S0502XA Injury of conjunctiva and corneal abrasion without foreign body, left eye, initial encounter: Secondary | ICD-10-CM | POA: Diagnosis not present

## 2023-01-31 DIAGNOSIS — E119 Type 2 diabetes mellitus without complications: Secondary | ICD-10-CM | POA: Diagnosis not present

## 2023-01-31 DIAGNOSIS — Z7984 Long term (current) use of oral hypoglycemic drugs: Secondary | ICD-10-CM | POA: Diagnosis not present

## 2023-02-04 DIAGNOSIS — Z7984 Long term (current) use of oral hypoglycemic drugs: Secondary | ICD-10-CM | POA: Diagnosis not present

## 2023-02-04 DIAGNOSIS — E119 Type 2 diabetes mellitus without complications: Secondary | ICD-10-CM | POA: Diagnosis not present

## 2023-02-04 DIAGNOSIS — S0502XA Injury of conjunctiva and corneal abrasion without foreign body, left eye, initial encounter: Secondary | ICD-10-CM | POA: Diagnosis not present

## 2023-02-04 DIAGNOSIS — H2513 Age-related nuclear cataract, bilateral: Secondary | ICD-10-CM | POA: Diagnosis not present

## 2023-02-07 DIAGNOSIS — E119 Type 2 diabetes mellitus without complications: Secondary | ICD-10-CM | POA: Diagnosis not present

## 2023-02-07 DIAGNOSIS — Z7984 Long term (current) use of oral hypoglycemic drugs: Secondary | ICD-10-CM | POA: Diagnosis not present

## 2023-02-07 DIAGNOSIS — S0502XA Injury of conjunctiva and corneal abrasion without foreign body, left eye, initial encounter: Secondary | ICD-10-CM | POA: Diagnosis not present

## 2023-02-07 DIAGNOSIS — H2513 Age-related nuclear cataract, bilateral: Secondary | ICD-10-CM | POA: Diagnosis not present

## 2023-02-25 ENCOUNTER — Other Ambulatory Visit: Payer: Self-pay | Admitting: Family Medicine

## 2023-02-26 NOTE — Telephone Encounter (Signed)
 Requested Prescriptions  Pending Prescriptions Disp Refills   meloxicam (MOBIC) 15 MG tablet [Pharmacy Med Name: MELOXICAM 15 MG TABLET] 90 tablet 0    Sig: TAKE 1 TABLET (15 MG TOTAL) BY MOUTH DAILY.     Analgesics:  COX2 Inhibitors Failed - 02/26/2023 10:45 AM      Failed - Manual Review: Labs are only required if the patient has taken medication for more than 8 weeks.      Passed - HGB in normal range and within 360 days    Hemoglobin  Date Value Ref Range Status  12/12/2022 15.0 13.0 - 17.7 g/dL Final         Passed - Cr in normal range and within 360 days    Creatinine  Date Value Ref Range Status  09/18/2012 0.78 0.60 - 1.30 mg/dL Final   Creatinine, Ser  Date Value Ref Range Status  12/12/2022 0.82 0.76 - 1.27 mg/dL Final         Passed - HCT in normal range and within 360 days    Hematocrit  Date Value Ref Range Status  12/12/2022 45.2 37.5 - 51.0 % Final         Passed - AST in normal range and within 360 days    AST  Date Value Ref Range Status  12/12/2022 35 0 - 40 IU/L Final   SGOT(AST)  Date Value Ref Range Status  09/15/2012 36 15 - 37 Unit/L Final   AST (SGOT) Piccolo, Waived  Date Value Ref Range Status  09/04/2018 38 11 - 38 U/L Final         Passed - ALT in normal range and within 360 days    ALT  Date Value Ref Range Status  12/12/2022 33 0 - 44 IU/L Final   SGPT (ALT)  Date Value Ref Range Status  09/15/2012 37 12 - 78 U/L Final   ALT (SGPT) Piccolo, Waived  Date Value Ref Range Status  09/04/2018 40 10 - 47 U/L Final         Passed - eGFR is 30 or above and within 360 days    EGFR (African American)  Date Value Ref Range Status  09/18/2012 >60  Final   GFR calc Af Amer  Date Value Ref Range Status  11/30/2019 107 >59 mL/min/1.73 Final    Comment:    **In accordance with recommendations from the NKF-ASN Task force,**   [facility] is in the process of updating its eGFR calculation to the   2021 CKD-EPI creatinine equation that  estimates kidney function   without a race variable.    EGFR (Non-African Amer.)  Date Value Ref Range Status  09/18/2012 >60  Final    Comment:    eGFR values <46mL/min/1.73 m2 may be an indication of chronic kidney disease (CKD). Calculated eGFR is useful in patients with stable renal function. The eGFR calculation will not be reliable in acutely ill patients when serum creatinine is changing rapidly. It is not useful in  patients on dialysis. The eGFR calculation may not be applicable to patients at the low and high extremes of body sizes, pregnant women, and vegetarians.    GFR, Estimated  Date Value Ref Range Status  11/11/2021 >60 >60 mL/min Final    Comment:    (NOTE) Calculated using the CKD-EPI Creatinine Equation (2021)    eGFR  Date Value Ref Range Status  12/12/2022 96 >59 mL/min/1.73 Final         Passed - Patient is  not pregnant      Passed - Valid encounter within last 12 months    Recent Outpatient Visits           2 months ago Type 2 diabetes mellitus with stage 1 chronic kidney disease, without long-term current use of insulin (HCC)   [place] [facility] [place], Megan P, DO   5 months ago Type 2 diabetes mellitus with stage 1 chronic kidney disease, without long-term current use of insulin (HCC)   [place] [facility] [place], Megan P, DO   8 months ago Type 2 diabetes mellitus with stage 1 chronic kidney disease, without long-term current use of insulin (HCC)   [place] [facility] [place], Megan P, DO   10 months ago Neck pain   [place] [facility] [place], DO   11 months ago Benign hypertensive renal disease   [place] [facility] Dorcas Carrow, DO       Future Appointments             In 2 weeks Laural Benes, Oralia Rud, DO [place] [facility]

## 2023-03-12 ENCOUNTER — Encounter: Payer: Self-pay | Admitting: Family Medicine

## 2023-03-12 ENCOUNTER — Ambulatory Visit (INDEPENDENT_AMBULATORY_CARE_PROVIDER_SITE_OTHER): Payer: HMO | Admitting: Family Medicine

## 2023-03-12 VITALS — BP 170/85 | HR 60 | Temp 98.9°F | Resp 17 | Ht 67.99 in | Wt 257.4 lb

## 2023-03-12 DIAGNOSIS — E1122 Type 2 diabetes mellitus with diabetic chronic kidney disease: Secondary | ICD-10-CM

## 2023-03-12 DIAGNOSIS — E7800 Pure hypercholesterolemia, unspecified: Secondary | ICD-10-CM | POA: Diagnosis not present

## 2023-03-12 DIAGNOSIS — R3911 Hesitancy of micturition: Secondary | ICD-10-CM | POA: Diagnosis not present

## 2023-03-12 DIAGNOSIS — Z0000 Encounter for general adult medical examination without abnormal findings: Secondary | ICD-10-CM | POA: Diagnosis not present

## 2023-03-12 DIAGNOSIS — N181 Chronic kidney disease, stage 1: Secondary | ICD-10-CM

## 2023-03-12 DIAGNOSIS — I129 Hypertensive chronic kidney disease with stage 1 through stage 4 chronic kidney disease, or unspecified chronic kidney disease: Secondary | ICD-10-CM | POA: Diagnosis not present

## 2023-03-12 LAB — MICROALBUMIN, URINE WAIVED
Creatinine, Urine Waived: 50 mg/dL (ref 10–300)
Microalb, Ur Waived: 30 mg/L — ABNORMAL HIGH (ref 0–19)

## 2023-03-12 LAB — BAYER DCA HB A1C WAIVED: HB A1C (BAYER DCA - WAIVED): 7.2 % — ABNORMAL HIGH (ref 4.8–5.6)

## 2023-03-12 MED ORDER — LOSARTAN POTASSIUM 100 MG PO TABS: 100.0000 mg | ORAL_TABLET | Freq: Every day | ORAL | 0 refills | Status: DC

## 2023-03-12 NOTE — Progress Notes (Signed)
 BP (!) 170/85   Pulse 60   Temp 98.9 F (37.2 C) (Oral)   Resp 17   Ht 5' 7.99" (1.727 m)   Wt 257 lb 6.4 oz (116.8 kg)   SpO2 98%   BMI 39.15 kg/m    Subjective:    Patient ID: Robert Griffith, male    DOB: 08/29/1955, 68 y.o.   MRN: [id number]  HPI: Robert Griffith is a 68 y.o. male presenting on 03/12/2023 for comprehensive medical examination. Current medical complaints include:  DIABETES Hypoglycemic episodes:no Polydipsia/polyuria: no Visual disturbance: no Chest pain: no Paresthesias: no Glucose Monitoring: yes  Accucheck frequency: Daily Taking Insulin?: no Blood Pressure Monitoring: not checking Retinal Examination: Up to Date Foot Exam: Up to Date Diabetic Education: Completed Pneumovax: Up to Date Influenza: Up to Date Aspirin: yes  HYPERTENSION / HYPERLIPIDEMIA Satisfied with current treatment? yes Duration of hypertension: chronic BP monitoring frequency: not checking BP medication side effects: no Past BP meds: losartan Duration of hyperlipidemia: chronic Cholesterol medication side effects: no Cholesterol supplements: none Past cholesterol medications: crestor Medication compliance: excellent compliance Aspirin: yes Recent stressors: no Recurrent headaches: no Visual changes: no Palpitations: no Dyspnea: no Chest pain: no Lower extremity edema: no Dizzy/lightheaded: no  Interim Problems from his last visit: yes  Depression Screen done today and results listed below:     01/15/2023    9:19 AM 12/12/2022    8:31 AM 09/12/2022    9:37 AM 06/12/2022    8:59 AM 03/26/2022    2:28 PM  Depression screen PHQ 2/9  Decreased Interest 0 0 0 0 0  Down, Depressed, Hopeless 0 0 0 0 0  PHQ - 2 Score 0 0 0 0 0  Altered sleeping 0 0 0 0 0  Tired, decreased energy 0 0 0 0 0  Change in appetite 0 0 0 0 0  Feeling bad or failure about yourself  0 0 0 0 0  Trouble concentrating 0 0 0 0 0  Moving slowly or fidgety/restless 0 0 0 0 0  Suicidal  thoughts 0 0 0 0 0  PHQ-9 Score 0 0 0 0 0  Difficult doing work/chores Not difficult at all Not difficult at all Not difficult at all Not difficult at all Not difficult at all    Past Medical History:  Past Medical History:  Diagnosis Date   Diabetes mellitus without complication (HCC)    Hyperlipidemia    Hypertension    Kidney stone    Obesity    Restless legs    Wears dentures    Has full upper and lower, only wears upper    Surgical History:  Past Surgical History:  Procedure Laterality Date   COLONOSCOPY N/A 09/12/2020   Procedure: COLONOSCOPY;  Surgeon: Midge Minium, MD;  Location: [facility];  Service: Endoscopy;  Laterality: N/A;  Diabetic   left toe surgery     POLYPECTOMY N/A 09/12/2020   Procedure: POLYPECTOMY;  Surgeon: Midge Minium, MD;  Location: [facility];  Service: Endoscopy;  Laterality: N/A;   sleep apnea surgery     TONSILLECTOMY AND ADENOIDECTOMY      Medications:  Current Outpatient Medications on File Prior to Visit  Medication Sig   aspirin 81 MG EC tablet Take 81 mg by mouth daily.     baclofen (LIORESAL) 10 MG tablet TAKE 1/2 TO 1 TABLET (5-10 MG TOTAL) BY MOUTH EVERY DAY AT BEDTIME AS NEEDED FOR MUSCLE SPASM   glucose blood test  strip Use as instructed   meloxicam (MOBIC) 15 MG tablet TAKE 1 TABLET (15 MG TOTAL) BY MOUTH DAILY.   metFORMIN (GLUCOPHAGE) 500 MG tablet Take 2 tablets (1,000 mg total) by mouth 2 (two) times daily with a meal.   montelukast (SINGULAIR) 10 MG tablet Take 1 tablet (10 mg total) by mouth at bedtime.   ONE TOUCH ULTRA TEST test strip USE TO TEST BLOOD SUGAR DAILY (EX E11.9)   rosuvastatin (CRESTOR) 40 MG tablet Take 1 tablet (40 mg total) by mouth daily.   Semaglutide (RYBELSUS) 7 MG TABS Take 1 tablet (7 mg total) by mouth daily.   lidocaine (LIDODERM) 5 % Place 1 patch onto the skin daily. Remove & Discard patch within 12 hours or as directed by MD (Patient not taking: Reported on 03/12/2023)   No current  facility-administered medications on file prior to visit.    Allergies:  Allergies  Allergen Reactions   Other     Bananas: indigestion    Social History:  Social History   Socioeconomic History   Marital status: Married    Spouse name: Rinaldo Cloud   Number of children: Not on file   Years of education: Not on file   Highest education level: Not on file  Occupational History   Occupation: retired  Tobacco Use   Smoking status: Never   Smokeless tobacco: Never  Vaping Use   Vaping status: Never Used  Substance and Sexual Activity   Alcohol use: No   Drug use: No   Sexual activity: Yes    Birth control/protection: None  Other Topics Concern   Not on file  Social History Narrative   2 step-daughter, 4 grandchildren   No formal exercise, but stays active   Social Drivers of Health   Financial Resource Strain: Low Risk  (01/15/2023)   Overall Financial Resource Strain (CARDIA)    Difficulty of Paying Living Expenses: Not hard at all  Food Insecurity: No Food Insecurity (01/15/2023)   Hunger Vital Sign    Worried About Running Out of Food in the Last Year: Never true    Ran Out of Food in the Last Year: Never true  Transportation Needs: No Transportation Needs (01/15/2023)   PRAPARE - Administrator, Civil Service (Medical): No    Lack of Transportation (Non-Medical): No  Physical Activity: Inactive (01/15/2023)   Exercise Vital Sign    Days of Exercise per Week: 0 days    Minutes of Exercise per Session: 0 min  Stress: No Stress Concern Present (01/15/2023)   [employer] of Occupational Health - Occupational Stress Questionnaire    Feeling of Stress : Not at all  Social Connections: Socially Integrated (01/15/2023)   Social Connection and Isolation Panel [NHANES]    Frequency of Communication with Friends and Family: More than three times a week    Frequency of Social Gatherings with Friends and Family: More than three times a week    Attends Religious  Services: More than 4 times per year    Active Member of [employer] or Organizations: Yes    Attends Banker Meetings: More than 4 times per year    Marital Status: Married  Catering manager Violence: Not At Risk (01/15/2023)   Humiliation, Afraid, Rape, and Kick questionnaire    Fear of Current or Ex-Partner: No    Emotionally Abused: No    Physically Abused: No    Sexually Abused: No   Social History   Tobacco Use  Smoking  Status Never  Smokeless Tobacco Never   Social History   Substance and Sexual Activity  Alcohol Use No    Family History:  Family History  Problem Relation Age of Onset   Heart attack Mother    Heart disease Mother    Heart disease Father    Stroke Brother    Coronary artery disease Other        4 family members    Past medical history, surgical history, medications, allergies, family history and social history reviewed with patient today and changes made to appropriate areas of the chart.   Review of Systems  Constitutional: Negative.   HENT: Negative.    Eyes: Negative.   Respiratory: Negative.    Cardiovascular: Negative.   Gastrointestinal: Negative.   Genitourinary:  Positive for frequency. Negative for dysuria, flank pain, hematuria and urgency.  Musculoskeletal: Negative.   Skin: Negative.        Spot on center chest getting bigger and darker  Neurological: Negative.   Endo/Heme/Allergies: Negative.   Psychiatric/Behavioral: Negative.     All other ROS negative except what is listed above and in the HPI.      Objective:    BP (!) 170/85   Pulse 60   Temp 98.9 F (37.2 C) (Oral)   Resp 17   Ht 5' 7.99" (1.727 m)   Wt 257 lb 6.4 oz (116.8 kg)   SpO2 98%   BMI 39.15 kg/m   Wt Readings from Last 3 Encounters:  03/12/23 257 lb 6.4 oz (116.8 kg)  01/15/23 240 lb (108.9 kg)  12/12/22 252 lb 6.4 oz (114.5 kg)    Physical Exam Vitals and nursing note reviewed.  Constitutional:      General: He is not in acute  distress.    Appearance: Normal appearance. He is obese. He is not ill-appearing, toxic-appearing or diaphoretic.  HENT:     Head: Normocephalic and atraumatic.     Right Ear: Tympanic membrane, ear canal and external ear normal. There is no impacted cerumen.     Left Ear: Tympanic membrane, ear canal and external ear normal. There is no impacted cerumen.     Nose: Nose normal. No congestion or rhinorrhea.     Mouth/Throat:     Mouth: Mucous membranes are moist.     Pharynx: Oropharynx is clear. No oropharyngeal exudate or posterior oropharyngeal erythema.  Eyes:     General: No scleral icterus.       Right eye: No discharge.        Left eye: No discharge.     Extraocular Movements: Extraocular movements intact.     Conjunctiva/sclera: Conjunctivae normal.     Pupils: Pupils are equal, round, and reactive to light.  Neck:     Vascular: No carotid bruit.  Cardiovascular:     Rate and Rhythm: Normal rate and regular rhythm.     Pulses: Normal pulses.     Heart sounds: No murmur heard.    No friction rub. No gallop.  Pulmonary:     Effort: Pulmonary effort is normal. No respiratory distress.     Breath sounds: Normal breath sounds. No stridor. No wheezing, rhonchi or rales.  Chest:     Chest wall: No tenderness.  Abdominal:     General: Abdomen is flat. Bowel sounds are normal. There is no distension.     Palpations: Abdomen is soft. There is no mass.     Tenderness: There is no abdominal tenderness. There is no right  CVA tenderness, left CVA tenderness, guarding or rebound.     Hernia: No hernia is present.  Genitourinary:    Comments: Genital exam deferred with shared decision making Musculoskeletal:        General: No swelling, tenderness, deformity or signs of injury.     Cervical back: Normal range of motion and neck supple. No rigidity. No muscular tenderness.     Right lower leg: No edema.     Left lower leg: No edema.  Lymphadenopathy:     Cervical: No cervical  adenopathy.  Skin:    General: Skin is warm and dry.     Capillary Refill: Capillary refill takes less than 2 seconds.     Coloration: Skin is not jaundiced or pale.     Findings: No bruising, erythema, lesion or rash.  Neurological:     General: No focal deficit present.     Mental Status: He is alert and oriented to person, place, and time.     Cranial Nerves: No cranial nerve deficit.     Sensory: No sensory deficit.     Motor: No weakness.     Coordination: Coordination normal.     Gait: Gait normal.     Deep Tendon Reflexes: Reflexes normal.  Psychiatric:        Mood and Affect: Mood normal.        Behavior: Behavior normal.        Thought Content: Thought content normal.        Judgment: Judgment normal.     Results for orders placed or performed in visit on 12/12/22  Bayer DCA Hb A1c Waived   Collection Time: 12/12/22  8:44 AM  Result Value Ref Range   HB A1C (BAYER DCA - WAIVED) 6.6 (H) 4.8 - 5.6 %  Microalbumin, Urine Waived   Collection Time: 12/12/22  8:44 AM  Result Value Ref Range   Microalb, Ur Waived 30 (H) 0 - 19 mg/L   Creatinine, Urine Waived 100 10 - 300 mg/dL   Microalb/Creat Ratio <30 <30 mg/g  CBC with Differential/Platelet   Collection Time: 12/12/22  8:45 AM  Result Value Ref Range   WBC 7.0 3.4 - 10.8 x10E3/uL   RBC 5.01 4.14 - 5.80 x10E6/uL   Hemoglobin 15.0 13.0 - 17.7 g/dL   Hematocrit 21.3 08.6 - 51.0 %   MCV 90 79 - 97 fL   MCH 29.9 26.6 - 33.0 pg   MCHC 33.2 31.5 - 35.7 g/dL   RDW 57.8 46.9 - 62.9 %   Platelets 186 150 - 450 x10E3/uL   Neutrophils 52 Not Estab. %   Lymphs 36 Not Estab. %   Monocytes 7 Not Estab. %   Eos 4 Not Estab. %   Basos 1 Not Estab. %   Neutrophils Absolute 3.6 1.4 - 7.0 x10E3/uL   Lymphocytes Absolute 2.5 0.7 - 3.1 x10E3/uL   Monocytes Absolute 0.5 0.1 - 0.9 x10E3/uL   EOS (ABSOLUTE) 0.3 0.0 - 0.4 x10E3/uL   Basophils Absolute 0.0 0.0 - 0.2 x10E3/uL   Immature Granulocytes 0 Not Estab. %   Immature Grans  (Abs) 0.0 0.0 - 0.1 x10E3/uL  Comprehensive metabolic panel   Collection Time: 12/12/22  8:45 AM  Result Value Ref Range   Glucose 121 (H) 70 - 99 mg/dL   BUN 12 8 - 27 mg/dL   Creatinine, Ser 5.28 0.76 - 1.27 mg/dL   eGFR 96 >41 LK/GMW/1.02   BUN/Creatinine Ratio 15 10 - 24  Sodium 140 134 - 144 mmol/L   Potassium 4.5 3.5 - 5.2 mmol/L   Chloride 104 96 - 106 mmol/L   CO2 21 20 - 29 mmol/L   Calcium 9.0 8.6 - 10.2 mg/dL   Total Protein 6.8 6.0 - 8.5 g/dL   Albumin 4.2 3.9 - 4.9 g/dL   Globulin, Total 2.6 1.5 - 4.5 g/dL   Bilirubin Total 0.4 0.0 - 1.2 mg/dL   Alkaline Phosphatase 63 44 - 121 IU/L   AST 35 0 - 40 IU/L   ALT 33 0 - 44 IU/L  Lipid Panel w/o Chol/HDL Ratio   Collection Time: 12/12/22  8:45 AM  Result Value Ref Range   Cholesterol, Total 116 100 - 199 mg/dL   Triglycerides 562 0 - 149 mg/dL   HDL 38 (L) >13 mg/dL   VLDL Cholesterol Cal 24 5 - 40 mg/dL   LDL Chol Calc (NIH) 54 0 - 99 mg/dL  TSH   Collection Time: 12/12/22  8:45 AM  Result Value Ref Range   TSH 4.720 (H) 0.450 - 4.500 uIU/mL  Vitamin B12   Collection Time: 12/12/22  8:45 AM  Result Value Ref Range   Vitamin B-12 345 232 - 1,245 pg/mL      Assessment & Plan:   Problem List Items Addressed This Visit       Endocrine   Type 2 diabetes mellitus with stage 1 chronic kidney disease, without long-term current use of insulin (HCC)   Up slightly at 7.2. Encouraged diet and exercise. Continue current regimen. Recheck 3 months.      Relevant Medications   losartan (COZAAR) 100 MG tablet   Other Relevant Orders   Comprehensive metabolic panel   CBC with Differential/Platelet   Lipid Panel w/o Chol/HDL Ratio   TSH   Bayer DCA Hb A1c Waived   Microalbumin, Urine Waived     Genitourinary   Benign hypertensive renal disease   Running high. Will increase his losartan to 100mg  and recheck 3 months. Call with any concerns.        Other   Hypercholesteremia   Under good control on current  regimen. Continue current regimen. Continue to monitor. Call with any concerns. Refills given. Labs drawn today.        Relevant Medications   losartan (COZAAR) 100 MG tablet   Morbid obesity (HCC)   Encouraged diet and exercise with goal of losing 1-2lbs per week. Call with any concerns.       Other Visit Diagnoses       Routine general medical examination at a health care facility    -  Primary   Vaccines up to date. Screening labs checked today. Colonoscopy up to date. Continue diet and exercise. Call with any concerns.     Hesitancy       Checking labs today. Await results.   Relevant Orders   PSA        LABORATORY TESTING:  Health maintenance labs ordered today as discussed above.   The natural history of prostate cancer and ongoing controversy regarding screening and potential treatment outcomes of prostate cancer has been discussed with the patient. The meaning of a false positive PSA and a false negative PSA has been discussed. He indicates understanding of the limitations of this screening test and wishes to proceed with screening PSA testing.   IMMUNIZATIONS:   - Tdap: Tetanus vaccination status reviewed: last tetanus booster within 10 years. - Influenza: Up to date - Pneumovax: Up to  date - Prevnar: Up to date - COVID: Refused - HPV: Not applicable - Shingrix vaccine: Up to date  SCREENING: - Colonoscopy: Up to date  Discussed with patient purpose of the colonoscopy is to detect colon cancer at curable precancerous or early stages   PATIENT COUNSELING:    Sexuality: Discussed sexually transmitted diseases, partner selection, use of condoms, avoidance of unintended pregnancy  and contraceptive alternatives.   Advised to avoid cigarette smoking.  I discussed with the patient that most people either abstain from alcohol or drink within safe limits (<=14/week and <=4 drinks/occasion for males, <=7/weeks and <= 3 drinks/occasion for females) and that the risk for  alcohol disorders and other health effects rises proportionally with the number of drinks per week and how often a drinker exceeds daily limits.  Discussed cessation/primary prevention of drug use and availability of treatment for abuse.   Diet: Encouraged to adjust caloric intake to maintain  or achieve ideal body weight, to reduce intake of dietary saturated fat and total fat, to limit sodium intake by avoiding high sodium foods and not adding table salt, and to maintain adequate dietary potassium and calcium preferably from fresh fruits, vegetables, and low-fat dairy products.    stressed the importance of regular exercise  Injury prevention: Discussed safety belts, safety helmets, smoke detector, smoking near bedding or upholstery.   Dental health: Discussed importance of regular tooth brushing, flossing, and dental visits.   Follow up plan: NEXT PREVENTATIVE PHYSICAL DUE IN 1 YEAR. Return in about 3 months (around 06/12/2023).

## 2023-03-12 NOTE — Assessment & Plan Note (Signed)
 Encouraged diet and exercise with goal of losing 1-2lbs per week. Call with any concerns.

## 2023-03-12 NOTE — Assessment & Plan Note (Signed)
 Up slightly at 7.2. Encouraged diet and exercise. Continue current regimen. Recheck 3 months.

## 2023-03-12 NOTE — Assessment & Plan Note (Signed)
 Running high. Will increase his losartan to 100mg  and recheck 3 months. Call with any concerns.

## 2023-03-12 NOTE — Assessment & Plan Note (Signed)
 Under good control on current regimen. Continue current regimen. Continue to monitor. Call with any concerns. Refills given. Labs drawn today.

## 2023-03-13 ENCOUNTER — Encounter: Payer: Self-pay | Admitting: Family Medicine

## 2023-03-13 LAB — COMPREHENSIVE METABOLIC PANEL
ALT: 35 IU/L (ref 0–44)
AST: 31 IU/L (ref 0–40)
Albumin: 4.3 g/dL (ref 3.9–4.9)
Alkaline Phosphatase: 69 IU/L (ref 44–121)
BUN/Creatinine Ratio: 18 (ref 10–24)
BUN: 15 mg/dL (ref 8–27)
Bilirubin Total: 0.5 mg/dL (ref 0.0–1.2)
CO2: 23 mmol/L (ref 20–29)
Calcium: 9.2 mg/dL (ref 8.6–10.2)
Chloride: 99 mmol/L (ref 96–106)
Creatinine, Ser: 0.84 mg/dL (ref 0.76–1.27)
Globulin, Total: 2.8 g/dL (ref 1.5–4.5)
Glucose: 159 mg/dL — ABNORMAL HIGH (ref 70–99)
Potassium: 4.5 mmol/L (ref 3.5–5.2)
Sodium: 135 mmol/L (ref 134–144)
Total Protein: 7.1 g/dL (ref 6.0–8.5)
eGFR: 94 mL/min/{1.73_m2} (ref >59)

## 2023-03-13 LAB — CBC WITH DIFFERENTIAL/PLATELET
Basophils Absolute: 0.1 10*3/uL (ref 0.0–0.2)
Basos: 1 %
EOS (ABSOLUTE): 0.2 10*3/uL (ref 0.0–0.4)
Eos: 4 %
Hematocrit: 44.9 % (ref 37.5–51.0)
Hemoglobin: 15.2 g/dL (ref 13.0–17.7)
Immature Grans (Abs): 0 10*3/uL (ref 0.0–0.1)
Immature Granulocytes: 0 %
Lymphocytes Absolute: 2.4 10*3/uL (ref 0.7–3.1)
Lymphs: 36 %
MCH: 30.5 pg (ref 26.6–33.0)
MCHC: 33.9 g/dL (ref 31.5–35.7)
MCV: 90 fL (ref 79–97)
Monocytes Absolute: 0.5 10*3/uL (ref 0.1–0.9)
Monocytes: 8 %
Neutrophils Absolute: 3.5 10*3/uL (ref 1.4–7.0)
Neutrophils: 51 %
Platelets: 188 10*3/uL (ref 150–450)
RBC: 4.99 x10E6/uL (ref 4.14–5.80)
RDW: 13.4 % (ref 11.6–15.4)
WBC: 6.7 10*3/uL (ref 3.4–10.8)

## 2023-03-13 LAB — LIPID PANEL W/O CHOL/HDL RATIO
Cholesterol, Total: 118 mg/dL (ref 100–199)
HDL: 41 mg/dL (ref >39)
LDL Chol Calc (NIH): 48 mg/dL (ref 0–99)
Triglycerides: 174 mg/dL — ABNORMAL HIGH (ref 0–149)
VLDL Cholesterol Cal: 29 mg/dL (ref 5–40)

## 2023-03-13 LAB — TSH: TSH: 3.63 u[IU]/mL (ref 0.450–4.500)

## 2023-03-13 LAB — PSA: Prostate Specific Ag, Serum: 1.3 ng/mL (ref 0.0–4.0)

## 2023-03-21 ENCOUNTER — Ambulatory Visit (INDEPENDENT_AMBULATORY_CARE_PROVIDER_SITE_OTHER): Admitting: Nurse Practitioner

## 2023-03-21 ENCOUNTER — Encounter: Payer: Self-pay | Admitting: Nurse Practitioner

## 2023-03-21 VITALS — BP 155/88 | HR 84 | Temp 98.0°F | Ht 68.0 in | Wt 260.2 lb

## 2023-03-21 DIAGNOSIS — R062 Wheezing: Secondary | ICD-10-CM

## 2023-03-21 MED ORDER — DOXYCYCLINE HYCLATE 100 MG PO TABS: 100.0000 mg | ORAL_TABLET | Freq: Two times a day (BID) | ORAL | 0 refills | Status: DC

## 2023-03-21 MED ORDER — METHYLPREDNISOLONE 4 MG PO TBPK: ORAL_TABLET | ORAL | 0 refills | Status: DC

## 2023-03-21 NOTE — Progress Notes (Signed)
 BP (!) 155/88 (BP Location: Left Arm, Patient Position: Sitting, Cuff Size: Large)   Pulse 84   Temp 98 F (36.7 C) (Oral)   Ht 5\' 8"  (1.727 m)   Wt 260 lb 3.2 oz (118 kg)   SpO2 97%   BMI 39.56 kg/m    Subjective:    Patient ID: Robert Griffith, male    DOB: [birth year], 68 y.o.   MRN: [id number]  HPI: Robert Griffith is a 68 y.o. male  Chief Complaint  Patient presents with   Cough    Has been going on since last week.    Fever   Nasal Congestion   UPPER RESPIRATORY TRACT INFECTION Worst symptom: symptoms started last Friday Fever: yes- yesterday 99.1 Cough: yes Shortness of breath: yes Wheezing: yes Chest pain: no Chest tightness: no Chest congestion: no Nasal congestion: yes Runny nose: yes Post nasal drip: yes Sneezing: yes Sore throat: no Swollen glands: no Sinus pressure: no Headache: yes Face pain: no Toothache: no Ear pain: no bilateral Ear pressure: no bilateral Eyes red/itching:no Eye drainage/crusting: no  Vomiting: no Rash: no Fatigue: yes Sick contacts: no Strep contacts: no  Context: stable Recurrent sinusitis: no Relief with OTC cold/cough medications: yes  Treatments attempted: cold/sinus   Relevant past medical, surgical, family and social history reviewed and updated as indicated. Interim medical history since our last visit reviewed. Allergies and medications reviewed and updated.  Review of Systems  Constitutional:  Positive for fatigue. Negative for fever.  HENT:  Positive for congestion, postnasal drip and rhinorrhea. Negative for ear pain, sinus pressure, sinus pain, sneezing and sore throat.   Respiratory:  Positive for cough and shortness of breath. Negative for chest tightness and wheezing.   Gastrointestinal:  Negative for vomiting.  Skin:  Negative for rash.  Neurological:  Positive for headaches.    Per HPI unless specifically indicated above     Objective:    BP (!) 155/88 (BP Location: Left Arm, Patient  Position: Sitting, Cuff Size: Large)   Pulse 84   Temp 98 F (36.7 C) (Oral)   Ht 5\' 8"  (1.727 m)   Wt 260 lb 3.2 oz (118 kg)   SpO2 97%   BMI 39.56 kg/m   Wt Readings from Last 3 Encounters:  03/21/23 260 lb 3.2 oz (118 kg)  03/12/23 257 lb 6.4 oz (116.8 kg)  01/15/23 240 lb (108.9 kg)    Physical Exam Vitals and nursing note reviewed.  Constitutional:      General: He is not in acute distress.    Appearance: Normal appearance. He is not ill-appearing, toxic-appearing or diaphoretic.  HENT:     Head: Normocephalic.     Right Ear: External ear normal.     Left Ear: External ear normal.     Nose: Nose normal. No congestion or rhinorrhea.     Mouth/Throat:     Mouth: Mucous membranes are moist.  Eyes:     General:        Right eye: No discharge.        Left eye: No discharge.     Extraocular Movements: Extraocular movements intact.     Conjunctiva/sclera: Conjunctivae normal.     Pupils: Pupils are equal, round, and reactive to light.  Cardiovascular:     Rate and Rhythm: Normal rate and regular rhythm.     Heart sounds: No murmur heard. Pulmonary:     Effort: Pulmonary effort is normal. No respiratory distress.  Breath sounds: Wheezing present. No rhonchi or rales.  Abdominal:     General: Abdomen is flat. Bowel sounds are normal.  Musculoskeletal:     Cervical back: Normal range of motion and neck supple.  Skin:    General: Skin is warm and dry.     Capillary Refill: Capillary refill takes less than 2 seconds.  Neurological:     General: No focal deficit present.     Mental Status: He is alert and oriented to person, place, and time.  Psychiatric:        Mood and Affect: Mood normal.        Behavior: Behavior normal.        Thought Content: Thought content normal.        Judgment: Judgment normal.     Results for orders placed or performed in visit on 03/12/23  Bayer DCA Hb A1c Waived   Collection Time: 03/12/23  9:03 AM  Result Value Ref Range   HB A1C  (BAYER DCA - WAIVED) 7.2 (H) 4.8 - 5.6 %  Microalbumin, Urine Waived   Collection Time: 03/12/23  9:03 AM  Result Value Ref Range   Microalb, Ur Waived 30 (H) 0 - 19 mg/L   Creatinine, Urine Waived 50 10 - 300 mg/dL   Microalb/Creat Ratio 30-300 (H) <30 mg/g  Comprehensive metabolic panel   Collection Time: 03/12/23  9:04 AM  Result Value Ref Range   Glucose 159 (H) 70 - 99 mg/dL   BUN 15 8 - 27 mg/dL   Creatinine, Ser 7.82 0.76 - 1.27 mg/dL   eGFR 94 >95 AO/ZHY/8.65   BUN/Creatinine Ratio 18 10 - 24   Sodium 135 134 - 144 mmol/L   Potassium 4.5 3.5 - 5.2 mmol/L   Chloride 99 96 - 106 mmol/L   CO2 23 20 - 29 mmol/L   Calcium 9.2 8.6 - 10.2 mg/dL   Total Protein 7.1 6.0 - 8.5 g/dL   Albumin 4.3 3.9 - 4.9 g/dL   Globulin, Total 2.8 1.5 - 4.5 g/dL   Bilirubin Total 0.5 0.0 - 1.2 mg/dL   Alkaline Phosphatase 69 44 - 121 IU/L   AST 31 0 - 40 IU/L   ALT 35 0 - 44 IU/L  CBC with Differential/Platelet   Collection Time: 03/12/23  9:04 AM  Result Value Ref Range   WBC 6.7 3.4 - 10.8 x10E3/uL   RBC 4.99 4.14 - 5.80 x10E6/uL   Hemoglobin 15.2 13.0 - 17.7 g/dL   Hematocrit 78.4 69.6 - 51.0 %   MCV 90 79 - 97 fL   MCH 30.5 26.6 - 33.0 pg   MCHC 33.9 31.5 - 35.7 g/dL   RDW 29.5 28.4 - 13.2 %   Platelets 188 150 - 450 x10E3/uL   Neutrophils 51 Not Estab. %   Lymphs 36 Not Estab. %   Monocytes 8 Not Estab. %   Eos 4 Not Estab. %   Basos 1 Not Estab. %   Neutrophils Absolute 3.5 1.4 - 7.0 x10E3/uL   Lymphocytes Absolute 2.4 0.7 - 3.1 x10E3/uL   Monocytes Absolute 0.5 0.1 - 0.9 x10E3/uL   EOS (ABSOLUTE) 0.2 0.0 - 0.4 x10E3/uL   Basophils Absolute 0.1 0.0 - 0.2 x10E3/uL   Immature Granulocytes 0 Not Estab. %   Immature Grans (Abs) 0.0 0.0 - 0.1 x10E3/uL  Lipid Panel w/o Chol/HDL Ratio   Collection Time: 03/12/23  9:04 AM  Result Value Ref Range   Cholesterol, Total 118 100 - 199 mg/dL  Triglycerides 174 (H) 0 - 149 mg/dL   HDL 41 >41 mg/dL   VLDL Cholesterol Cal 29 5 - 40 mg/dL    LDL Chol Calc (NIH) 48 0 - 99 mg/dL  PSA   Collection Time: 03/12/23  9:04 AM  Result Value Ref Range   Prostate Specific Ag, Serum 1.3 0.0 - 4.0 ng/mL  TSH   Collection Time: 03/12/23  9:04 AM  Result Value Ref Range   TSH 3.630 0.450 - 4.500 uIU/mL      Assessment & Plan:   Problem List Items Addressed This Visit   None Visit Diagnoses       Wheezing    -  Primary   Will treat with Doxycyline and medrol dose pak.  Complete course of medications.  Follow up next week for lung recheck.        Follow up plan: Return in about 1 week (around 03/28/2023) for Lung check (DJ or Me).

## 2023-03-28 ENCOUNTER — Ambulatory Visit: Admitting: Nurse Practitioner

## 2023-04-03 ENCOUNTER — Encounter: Payer: Self-pay | Admitting: Nurse Practitioner

## 2023-04-03 ENCOUNTER — Ambulatory Visit (INDEPENDENT_AMBULATORY_CARE_PROVIDER_SITE_OTHER): Admitting: Nurse Practitioner

## 2023-04-03 VITALS — BP 158/81 | HR 87 | Temp 98.1°F | Resp 18 | Ht 67.99 in | Wt 262.2 lb

## 2023-04-03 DIAGNOSIS — R062 Wheezing: Secondary | ICD-10-CM

## 2023-04-03 NOTE — Progress Notes (Signed)
 BP (!) 158/81 (BP Location: Right Arm, Patient Position: Sitting, Cuff Size: Large)   Pulse 87   Temp 98.1 F (36.7 C) (Oral)   Resp 18   Ht 5' 7.99" (1.727 m)   Wt 262 lb 3.2 oz (118.9 kg)   SpO2 96%   BMI 39.88 kg/m    Subjective:    Patient ID: Robert Griffith, male    DOB: July 08, 1955, 68 y.o.   MRN: [id number]  HPI: Robert Griffith is a 68 y.o. male  Chief Complaint  Patient presents with   Wheezing    Gotten better but not yet over. Still having a lot of coughing. Unable to lear that cough at times. Energy is starting to improve.    Patient presents to clinic to follow up on URI.  States his symptoms are improving.  His cough is getting better as well as his fatigue.  However, he is still coughing especially in the morning.    Relevant past medical, surgical, family and social history reviewed and updated as indicated. Interim medical history since our last visit reviewed. Allergies and medications reviewed and updated.  Review of Systems  Constitutional:  Positive for fatigue.  Respiratory:  Positive for cough.     Per HPI unless specifically indicated above     Objective:    BP (!) 158/81 (BP Location: Right Arm, Patient Position: Sitting, Cuff Size: Large)   Pulse 87   Temp 98.1 F (36.7 C) (Oral)   Resp 18   Ht 5' 7.99" (1.727 m)   Wt 262 lb 3.2 oz (118.9 kg)   SpO2 96%   BMI 39.88 kg/m   Wt Readings from Last 3 Encounters:  04/03/23 262 lb 3.2 oz (118.9 kg)  03/21/23 260 lb 3.2 oz (118 kg)  03/12/23 257 lb 6.4 oz (116.8 kg)    Physical Exam Vitals and nursing note reviewed.  Constitutional:      General: He is not in acute distress.    Appearance: Normal appearance. He is not ill-appearing, toxic-appearing or diaphoretic.  HENT:     Head: Normocephalic.     Right Ear: External ear normal.     Left Ear: External ear normal.     Nose: Nose normal. No congestion or rhinorrhea.     Mouth/Throat:     Mouth: Mucous membranes are moist.   Eyes:     General:        Right eye: No discharge.        Left eye: No discharge.     Extraocular Movements: Extraocular movements intact.     Conjunctiva/sclera: Conjunctivae normal.     Pupils: Pupils are equal, round, and reactive to light.  Cardiovascular:     Rate and Rhythm: Normal rate and regular rhythm.     Heart sounds: No murmur heard. Pulmonary:     Effort: Pulmonary effort is normal. No respiratory distress.     Breath sounds: Normal breath sounds. No wheezing, rhonchi or rales.  Abdominal:     General: Abdomen is flat. Bowel sounds are normal.  Musculoskeletal:     Cervical back: Normal range of motion and neck supple.  Skin:    General: Skin is warm and dry.     Capillary Refill: Capillary refill takes less than 2 seconds.  Neurological:     General: No focal deficit present.     Mental Status: He is alert and oriented to person, place, and time.  Psychiatric:  Mood and Affect: Mood normal.        Behavior: Behavior normal.        Thought Content: Thought content normal.        Judgment: Judgment normal.     Results for orders placed or performed in visit on 03/12/23  Bayer DCA Hb A1c Waived   Collection Time: 03/12/23  9:03 AM  Result Value Ref Range   HB A1C (BAYER DCA - WAIVED) 7.2 (H) 4.8 - 5.6 %  Microalbumin, Urine Waived   Collection Time: 03/12/23  9:03 AM  Result Value Ref Range   Microalb, Ur Waived 30 (H) 0 - 19 mg/L   Creatinine, Urine Waived 50 10 - 300 mg/dL   Microalb/Creat Ratio 30-300 (H) <30 mg/g  Comprehensive metabolic panel   Collection Time: 03/12/23  9:04 AM  Result Value Ref Range   Glucose 159 (H) 70 - 99 mg/dL   BUN 15 8 - 27 mg/dL   Creatinine, Ser 8.46 0.76 - 1.27 mg/dL   eGFR 94 >96 EX/BMW/4.13   BUN/Creatinine Ratio 18 10 - 24   Sodium 135 134 - 144 mmol/L   Potassium 4.5 3.5 - 5.2 mmol/L   Chloride 99 96 - 106 mmol/L   CO2 23 20 - 29 mmol/L   Calcium 9.2 8.6 - 10.2 mg/dL   Total Protein 7.1 6.0 - 8.5 g/dL    Albumin 4.3 3.9 - 4.9 g/dL   Globulin, Total 2.8 1.5 - 4.5 g/dL   Bilirubin Total 0.5 0.0 - 1.2 mg/dL   Alkaline Phosphatase 69 44 - 121 IU/L   AST 31 0 - 40 IU/L   ALT 35 0 - 44 IU/L  CBC with Differential/Platelet   Collection Time: 03/12/23  9:04 AM  Result Value Ref Range   WBC 6.7 3.4 - 10.8 x10E3/uL   RBC 4.99 4.14 - 5.80 x10E6/uL   Hemoglobin 15.2 13.0 - 17.7 g/dL   Hematocrit 24.4 01.0 - 51.0 %   MCV 90 79 - 97 fL   MCH 30.5 26.6 - 33.0 pg   MCHC 33.9 31.5 - 35.7 g/dL   RDW 27.2 53.6 - 64.4 %   Platelets 188 150 - 450 x10E3/uL   Neutrophils 51 Not Estab. %   Lymphs 36 Not Estab. %   Monocytes 8 Not Estab. %   Eos 4 Not Estab. %   Basos 1 Not Estab. %   Neutrophils Absolute 3.5 1.4 - 7.0 x10E3/uL   Lymphocytes Absolute 2.4 0.7 - 3.1 x10E3/uL   Monocytes Absolute 0.5 0.1 - 0.9 x10E3/uL   EOS (ABSOLUTE) 0.2 0.0 - 0.4 x10E3/uL   Basophils Absolute 0.1 0.0 - 0.2 x10E3/uL   Immature Granulocytes 0 Not Estab. %   Immature Grans (Abs) 0.0 0.0 - 0.1 x10E3/uL  Lipid Panel w/o Chol/HDL Ratio   Collection Time: 03/12/23  9:04 AM  Result Value Ref Range   Cholesterol, Total 118 100 - 199 mg/dL   Triglycerides 034 (H) 0 - 149 mg/dL   HDL 41 >74 mg/dL   VLDL Cholesterol Cal 29 5 - 40 mg/dL   LDL Chol Calc (NIH) 48 0 - 99 mg/dL  PSA   Collection Time: 03/12/23  9:04 AM  Result Value Ref Range   Prostate Specific Ag, Serum 1.3 0.0 - 4.0 ng/mL  TSH   Collection Time: 03/12/23  9:04 AM  Result Value Ref Range   TSH 3.630 0.450 - 4.500 uIU/mL      Assessment & Plan:   Problem List  Items Addressed This Visit   None Visit Diagnoses       Wheezing    -  Primary   Symptoms are improving.  Recommend resting and staying well hydrated.  Follow up if symptoms are not improved.        Follow up plan: No follow-ups on file.

## 2023-04-16 ENCOUNTER — Ambulatory Visit (INDEPENDENT_AMBULATORY_CARE_PROVIDER_SITE_OTHER): Admitting: Family Medicine

## 2023-04-16 ENCOUNTER — Encounter: Payer: Self-pay | Admitting: Family Medicine

## 2023-04-16 ENCOUNTER — Other Ambulatory Visit (HOSPITAL_COMMUNITY)
Admission: RE | Admit: 2023-04-16 | Discharge: 2023-04-16 | Disposition: A | Discharge status: Home / Self Care | Source: Ambulatory Visit | Attending: Family Medicine | Admitting: Family Medicine

## 2023-04-16 VITALS — BP 170/81 | HR 81 | Temp 98.6°F | Resp 17 | Ht 67.99 in | Wt 262.0 lb

## 2023-04-16 DIAGNOSIS — D485 Neoplasm of uncertain behavior of skin: Secondary | ICD-10-CM

## 2023-04-16 DIAGNOSIS — L821 Other seborrheic keratosis: Secondary | ICD-10-CM | POA: Diagnosis not present

## 2023-04-16 MED ORDER — LIDOCAINE HCL (PF) 1 % IJ SOLN
5.0000 mL | Freq: Once | INTRAMUSCULAR | Status: AC
Start: 2023-04-16 — End: ?

## 2023-04-16 NOTE — Progress Notes (Signed)
 BP (!) 170/81 (BP Location: Right Arm, Patient Position: Sitting, Cuff Size: Large)   Pulse 81   Temp 98.6 F (37 C) (Oral)   Resp 17   Ht 5' 7.99" (1.727 m)   Wt 262 lb (118.8 kg)   SpO2 98%   BMI 39.85 kg/m    Subjective:    Patient ID: Robert Griffith, male    DOB: 1955/11/03, 68 y.o.   MRN: [id number]  HPI: Robert Griffith is a 68 y.o. male  Chief Complaint  Patient presents with   Skin Lesion   SKIN LESION Duration: chronic, but getting darker Location: chest Painful: no Itching: no Onset: gradual Context: changing Associated signs and symptoms:  History of skin cancer: no History of precancerous skin lesions: no Family history of skin cancer: no  Relevant past medical, surgical, family and social history reviewed and updated as indicated. Interim medical history since our last visit reviewed. Allergies and medications reviewed and updated.  Review of Systems  Constitutional: Negative.   Respiratory: Negative.    Cardiovascular: Negative.   Gastrointestinal: Negative.   Musculoskeletal: Negative.   Skin:  Positive for color change. Negative for pallor, rash and wound.  Psychiatric/Behavioral: Negative.      Per HPI unless specifically indicated above     Objective:    BP (!) 170/81 (BP Location: Right Arm, Patient Position: Sitting, Cuff Size: Large)   Pulse 81   Temp 98.6 F (37 C) (Oral)   Resp 17   Ht 5' 7.99" (1.727 m)   Wt 262 lb (118.8 kg)   SpO2 98%   BMI 39.85 kg/m   Wt Readings from Last 3 Encounters:  04/16/23 262 lb (118.8 kg)  04/03/23 262 lb 3.2 oz (118.9 kg)  03/21/23 260 lb 3.2 oz (118 kg)    Physical Exam Vitals and nursing note reviewed.  Constitutional:      General: He is not in acute distress.    Appearance: Normal appearance. He is well-developed.  HENT:     Head: Normocephalic and atraumatic.     Right Ear: Hearing and external ear normal.     Left Ear: Hearing and external ear normal.     Nose: Nose normal.      Mouth/Throat:     Mouth: Mucous membranes are moist.     Pharynx: Oropharynx is clear.  Eyes:     General: Lids are normal. No scleral icterus.       Right eye: No discharge.        Left eye: No discharge.     Conjunctiva/sclera: Conjunctivae normal.  Pulmonary:     Effort: Pulmonary effort is normal. No respiratory distress.  Musculoskeletal:        General: Normal range of motion.  Skin:    Coloration: Skin is not jaundiced or pale.     Findings: No bruising, erythema, lesion or rash.     Comments: 1 inch hyperpigmented lesion with area of darker skin in the middle of slightly L of midline in epigastric region  Neurological:     General: No focal deficit present.     Mental Status: He is alert and oriented to person, place, and time. Mental status is at baseline.  Psychiatric:        Mood and Affect: Mood normal.        Speech: Speech normal.        Behavior: Behavior normal.        Thought Content: Thought content normal.  Judgment: Judgment normal.     Results for orders placed or performed in visit on 03/12/23  Bayer DCA Hb A1c Waived   Collection Time: 03/12/23  9:03 AM  Result Value Ref Range   HB A1C (BAYER DCA - WAIVED) 7.2 (H) 4.8 - 5.6 %  Microalbumin, Urine Waived   Collection Time: 03/12/23  9:03 AM  Result Value Ref Range   Microalb, Ur Waived 30 (H) 0 - 19 mg/L   Creatinine, Urine Waived 50 10 - 300 mg/dL   Microalb/Creat Ratio 30-300 (H) <30 mg/g  Comprehensive metabolic panel   Collection Time: 03/12/23  9:04 AM  Result Value Ref Range   Glucose 159 (H) 70 - 99 mg/dL   BUN 15 8 - 27 mg/dL   Creatinine, Ser 1.61 0.76 - 1.27 mg/dL   eGFR 94 >09 UE/AVW/0.98   BUN/Creatinine Ratio 18 10 - 24   Sodium 135 134 - 144 mmol/L   Potassium 4.5 3.5 - 5.2 mmol/L   Chloride 99 96 - 106 mmol/L   CO2 23 20 - 29 mmol/L   Calcium 9.2 8.6 - 10.2 mg/dL   Total Protein 7.1 6.0 - 8.5 g/dL   Albumin 4.3 3.9 - 4.9 g/dL   Globulin, Total 2.8 1.5 - 4.5 g/dL    Bilirubin Total 0.5 0.0 - 1.2 mg/dL   Alkaline Phosphatase 69 44 - 121 IU/L   AST 31 0 - 40 IU/L   ALT 35 0 - 44 IU/L  CBC with Differential/Platelet   Collection Time: 03/12/23  9:04 AM  Result Value Ref Range   WBC 6.7 3.4 - 10.8 x10E3/uL   RBC 4.99 4.14 - 5.80 x10E6/uL   Hemoglobin 15.2 13.0 - 17.7 g/dL   Hematocrit 11.9 14.7 - 51.0 %   MCV 90 79 - 97 fL   MCH 30.5 26.6 - 33.0 pg   MCHC 33.9 31.5 - 35.7 g/dL   RDW 82.9 56.2 - 13.0 %   Platelets 188 150 - 450 x10E3/uL   Neutrophils 51 Not Estab. %   Lymphs 36 Not Estab. %   Monocytes 8 Not Estab. %   Eos 4 Not Estab. %   Basos 1 Not Estab. %   Neutrophils Absolute 3.5 1.4 - 7.0 x10E3/uL   Lymphocytes Absolute 2.4 0.7 - 3.1 x10E3/uL   Monocytes Absolute 0.5 0.1 - 0.9 x10E3/uL   EOS (ABSOLUTE) 0.2 0.0 - 0.4 x10E3/uL   Basophils Absolute 0.1 0.0 - 0.2 x10E3/uL   Immature Granulocytes 0 Not Estab. %   Immature Grans (Abs) 0.0 0.0 - 0.1 x10E3/uL  Lipid Panel w/o Chol/HDL Ratio   Collection Time: 03/12/23  9:04 AM  Result Value Ref Range   Cholesterol, Total 118 100 - 199 mg/dL   Triglycerides 865 (H) 0 - 149 mg/dL   HDL 41 >78 mg/dL   VLDL Cholesterol Cal 29 5 - 40 mg/dL   LDL Chol Calc (NIH) 48 0 - 99 mg/dL  PSA   Collection Time: 03/12/23  9:04 AM  Result Value Ref Range   Prostate Specific Ag, Serum 1.3 0.0 - 4.0 ng/mL  TSH   Collection Time: 03/12/23  9:04 AM  Result Value Ref Range   TSH 3.630 0.450 - 4.500 uIU/mL      Assessment & Plan:   Problem List Items Addressed This Visit   None Visit Diagnoses       Neoplasm of uncertain behavior of skin    -  Primary   Removed today as below and  sent for pathology. Await results.   Relevant Orders   Surgical pathology       Skin Procedure  Procedure: Informed consent given.  Sterile prep of the area.  Area infiltrated with lidocaine with epinephrine.  Using a surgical blade, part of the upper dermis shaved off and sent  for pathology.  Area cauterized. Pt ed on  scarring.  Diagnosis:   ICD-10-CM   1. Neoplasm of uncertain behavior of skin  D48.5 Surgical pathology   Removed today as below and sent for pathology. Await results.      Lesion Location/Size: 1 inch hyperpigmented lesion with area of darker skin in the middle of slightly L of midline in epigastric region Physician: MJ Consent:  Risks, benefits, and alternative treatments discussed and all questions were answered.  Patient elected to proceed and verbal consent obtained.  Description: Area prepped and draped using semi-sterile technique. Area locally anesthetized using 5 cc's of lidocaine 2% with epi. Shave biopsy of lesion performed using a dermablade.  Adequate hemostastis achieved using Silver Nitrate. Wound dressed after application of bacitracin ointment.  Post Procedure Instructions: Wound care instructions discussed and patient was instructed to keep area clean and dry.  Signs and symptoms of infection discussed, patient agrees to contact the office ASAP should they occur.  Dressing change recommended every other day.   Follow up plan: Return for As scheduled.

## 2023-04-16 NOTE — Addendum Note (Signed)
 Addended by: Shaquila Sigman M on: 04/16/2023 04:30 PM   Modules accepted: Orders

## 2023-04-19 LAB — SURGICAL PATHOLOGY

## 2023-04-23 ENCOUNTER — Encounter: Payer: Self-pay | Admitting: Family Medicine

## 2023-05-21 ENCOUNTER — Other Ambulatory Visit: Payer: Self-pay | Admitting: Family Medicine

## 2023-05-21 NOTE — Telephone Encounter (Signed)
 Copied from CRM [id number]. Topic: Clinical - Medication Refill >> May 21, 2023  1:57 PM Ary Bitter R wrote: Medication: ONE TOUCH ULTRA TEST test strip  Has the patient contacted their pharmacy? Yes. Sent requests and received nothing back yet.  This is the patient's preferred pharmacy:  [facility] [address], [place] - [address] [place] [address] Phone: [phone] Fax: [phone]  Is this the correct pharmacy for this prescription? Yes If no, delete pharmacy and type the correct one.   Has the prescription been filled recently? No  Is the patient out of the medication? Yes  Has the patient been seen for an appointment in the last year OR does the patient have an upcoming appointment? Yes  Can we respond through MyChart? Yes  Agent: Please be advised that Rx refills may take up to 3 business days. We ask that you follow-up with your pharmacy.

## 2023-05-21 NOTE — Telephone Encounter (Signed)
 Prescription refill request received for:  ONE TOUCH ULTRA TEST test strip   Unable to pend to send to office appropriately.   Please advise.

## 2023-05-22 MED ORDER — ONETOUCH ULTRA VI STRP: 1.0000 | ORAL_STRIP | Freq: Two times a day (BID) | 3 refills | Status: AC

## 2023-05-23 ENCOUNTER — Other Ambulatory Visit: Payer: Self-pay | Admitting: Family Medicine

## 2023-05-24 NOTE — Telephone Encounter (Signed)
 Requested medication (s) are due for refill today: Yes  Requested medication (s) are on the active medication list: Yes  Last refill:  02/26/23  Future visit scheduled:   Notes to clinic:  Manual review.    Requested Prescriptions  Pending Prescriptions Disp Refills   meloxicam  (MOBIC ) 15 MG tablet [Pharmacy Med Name: MELOXICAM  15 MG TABLET] 90 tablet 0    Sig: TAKE 1 TABLET (15 MG TOTAL) BY MOUTH DAILY.     Analgesics:  COX2 Inhibitors Failed - 05/24/2023 10:05 AM      Failed - Manual Review: Labs are only required if the patient has taken medication for more than 8 weeks.      Passed - HGB in normal range and within 360 days    Hemoglobin  Date Value Ref Range Status  03/12/2023 15.2 13.0 - 17.7 g/dL Final         Passed - Cr in normal range and within 360 days    Creatinine  Date Value Ref Range Status  09/18/2012 0.78 0.60 - 1.30 mg/dL Final   Creatinine, Ser  Date Value Ref Range Status  03/12/2023 0.84 0.76 - 1.27 mg/dL Final         Passed - HCT in normal range and within 360 days    Hematocrit  Date Value Ref Range Status  03/12/2023 44.9 37.5 - 51.0 % Final         Passed - AST in normal range and within 360 days    AST  Date Value Ref Range Status  03/12/2023 31 0 - 40 IU/L Final   SGOT(AST)  Date Value Ref Range Status  09/15/2012 36 15 - 37 Unit/L Final   AST (SGOT) Piccolo, Waived  Date Value Ref Range Status  09/04/2018 38 11 - 38 U/L Final         Passed - ALT in normal range and within 360 days    ALT  Date Value Ref Range Status  03/12/2023 35 0 - 44 IU/L Final   SGPT (ALT)  Date Value Ref Range Status  09/15/2012 37 12 - 78 U/L Final   ALT (SGPT) Piccolo, Waived  Date Value Ref Range Status  09/04/2018 40 10 - 47 U/L Final         Passed - eGFR is 30 or above and within 360 days    EGFR (African American)  Date Value Ref Range Status  09/18/2012 >60  Final   GFR calc Af Amer  Date Value Ref Range Status  11/30/2019 107 >59  mL/min/1.73 Final    Comment:    **In accordance with recommendations from the NKF-ASN Task force,**   [facility] is in the process of updating its eGFR calculation to the   2021 CKD-EPI creatinine equation that estimates kidney function   without a race variable.    EGFR (Non-African Amer.)  Date Value Ref Range Status  09/18/2012 >60  Final    Comment:    eGFR values <66mL/min/1.73 m2 may be an indication of chronic kidney disease (CKD). Calculated eGFR is useful in patients with stable renal function. The eGFR calculation will not be reliable in acutely ill patients when serum creatinine is changing rapidly. It is not useful in  patients on dialysis. The eGFR calculation may not be applicable to patients at the low and high extremes of body sizes, pregnant women, and vegetarians.    GFR, Estimated  Date Value Ref Range Status  11/11/2021 >60 >60 mL/min Final  Comment:    (NOTE) Calculated using the CKD-EPI Creatinine Equation (2021)    eGFR  Date Value Ref Range Status  03/12/2023 94 >59 mL/min/1.73 Final         Passed - Patient is not pregnant      Passed - Valid encounter within last 12 months    Recent Outpatient Visits           1 month ago Neoplasm of uncertain behavior of skin   [place] [facility] [place], Megan P, DO   1 month ago Wheezing   [place] [facility] Aileen Alexanders, NP   2 months ago Wheezing   [place] [facility] Aileen Alexanders, NP   2 months ago Routine general medical examination at a health care facility   [facility], Megan P, DO

## 2023-05-25 ENCOUNTER — Other Ambulatory Visit: Payer: Self-pay | Admitting: Family Medicine

## 2023-05-25 DIAGNOSIS — E7800 Pure hypercholesterolemia, unspecified: Secondary | ICD-10-CM

## 2023-05-29 NOTE — Telephone Encounter (Signed)
 Requested Prescriptions  Pending Prescriptions Disp Refills   montelukast  (SINGULAIR ) 10 MG tablet [Pharmacy Med Name: MONTELUKAST  SOD 10 MG TABLET] 90 tablet 1    Sig: TAKE 1 TABLET BY MOUTH EVERYDAY AT BEDTIME     Pulmonology:  Leukotriene Inhibitors Passed - 05/29/2023  8:42 AM      Passed - Valid encounter within last 12 months    Recent Outpatient Visits           1 month ago Neoplasm of uncertain behavior of skin   [facility] [place], Megan P, DO   1 month ago Wheezing   [place] [place], Mariah Shines, NP   2 months ago Wheezing    [facility] Aileen Alexanders, NP   2 months ago Routine general medical examination at a health care facility   [facility], Megan P, DO               rosuvastatin  (CRESTOR ) 40 MG tablet [Pharmacy Med Name: ROSUVASTATIN  CALCIUM  40 MG TAB] 90 tablet 1    Sig: TAKE 1 TABLET BY MOUTH EVERY DAY     Cardiovascular:  Antilipid - Statins 2 Failed - 05/29/2023  8:42 AM      Failed - Lipid Panel in normal range within the last 12 months    Cholesterol, Total  Date Value Ref Range Status  03/12/2023 118 100 - 199 mg/dL Final   Cholesterol Piccolo, Waived  Date Value Ref Range Status  09/04/2018 120 <200 mg/dL Final    Comment:                            Desirable                <200                         Borderline High      200- 239                         High                     >239    LDL Chol Calc (NIH)  Date Value Ref Range Status  03/12/2023 48 0 - 99 mg/dL Final   HDL  Date Value Ref Range Status  03/12/2023 41 >39 mg/dL Final   Triglycerides  Date Value Ref Range Status  03/12/2023 174 (H) 0 - 149 mg/dL Final   Triglycerides Piccolo,Waived  Date Value Ref Range Status  09/04/2018 169 (H) <150 mg/dL Final    Comment:                            Normal                   <150                         Borderline High      150 - 199                         High                200 - 499  Very High                >499          Passed - Cr in normal range and within 360 days    Creatinine  Date Value Ref Range Status  09/18/2012 0.78 0.60 - 1.30 mg/dL Final   Creatinine, Ser  Date Value Ref Range Status  03/12/2023 0.84 0.76 - 1.27 mg/dL Final         Passed - Patient is not pregnant      Passed - Valid encounter within last 12 months    Recent Outpatient Visits           1 month ago Neoplasm of uncertain behavior of skin   [facility] [place], Megan P, DO   1 month ago Wheezing   [place] [facility] Aileen Alexanders, NP   2 months ago Wheezing   [place] [facility] Aileen Alexanders, NP   2 months ago Routine general medical examination at a health care facility   [facility], Megan P, DO

## 2023-06-08 ENCOUNTER — Other Ambulatory Visit: Payer: Self-pay | Admitting: Family Medicine

## 2023-06-10 NOTE — Telephone Encounter (Signed)
 Requested Prescriptions  Pending Prescriptions Disp Refills   losartan  (COZAAR ) 100 MG tablet [Pharmacy Med Name: LOSARTAN  POTASSIUM 100 MG TAB] 90 tablet 0    Sig: TAKE 1 TABLET BY MOUTH EVERY DAY     Cardiovascular:  Angiotensin Receptor Blockers Failed - 06/10/2023  2:31 PM      Failed - Last BP in normal range    BP Readings from Last 1 Encounters:  04/16/23 (!) 170/81         Passed - Cr in normal range and within 180 days    Creatinine  Date Value Ref Range Status  09/18/2012 0.78 0.60 - 1.30 mg/dL Final   Creatinine, Ser  Date Value Ref Range Status  03/12/2023 0.84 0.76 - 1.27 mg/dL Final         Passed - K in normal range and within 180 days    Potassium  Date Value Ref Range Status  03/12/2023 4.5 3.5 - 5.2 mmol/L Final  09/18/2012 3.9 3.5 - 5.1 mmol/L Final         Passed - Patient is not pregnant      Passed - Valid encounter within last 6 months    Recent Outpatient Visits           1 month ago Neoplasm of uncertain behavior of skin   [facility] [place], Megan P, DO   2 months ago Wheezing   [place] [facility] Aileen Alexanders, NP   2 months ago Wheezing   [facility] Aileen Alexanders, NP   3 months ago Routine general medical examination at a health care facility   [facility], Megan P, DO

## 2023-06-19 ENCOUNTER — Encounter: Payer: Self-pay | Admitting: Family Medicine

## 2023-06-19 ENCOUNTER — Ambulatory Visit: Admitting: Family Medicine

## 2023-06-19 VITALS — BP 172/90 | HR 65 | Ht 68.0 in | Wt 264.0 lb

## 2023-06-19 DIAGNOSIS — E1122 Type 2 diabetes mellitus with diabetic chronic kidney disease: Secondary | ICD-10-CM

## 2023-06-19 DIAGNOSIS — I129 Hypertensive chronic kidney disease with stage 1 through stage 4 chronic kidney disease, or unspecified chronic kidney disease: Secondary | ICD-10-CM

## 2023-06-19 DIAGNOSIS — N181 Chronic kidney disease, stage 1: Secondary | ICD-10-CM | POA: Diagnosis not present

## 2023-06-19 LAB — BAYER DCA HB A1C WAIVED: HB A1C (BAYER DCA - WAIVED): 7.1 % — ABNORMAL HIGH (ref 4.8–5.6)

## 2023-06-19 MED ORDER — AMLODIPINE BESYLATE 5 MG PO TABS: 5.0000 mg | ORAL_TABLET | Freq: Every day | ORAL | 0 refills | Status: DC

## 2023-06-19 MED ORDER — LOSARTAN POTASSIUM 100 MG PO TABS: 100.0000 mg | ORAL_TABLET | Freq: Every day | ORAL | 1 refills | Status: DC

## 2023-06-19 MED ORDER — BACLOFEN 10 MG PO TABS: ORAL_TABLET | ORAL | 0 refills | Status: DC

## 2023-06-19 MED ORDER — METFORMIN HCL 500 MG PO TABS: 1000.0000 mg | ORAL_TABLET | Freq: Two times a day (BID) | ORAL | 1 refills | Status: DC

## 2023-06-19 MED ORDER — RYBELSUS 7 MG PO TABS: 7.0000 mg | ORAL_TABLET | Freq: Every day | ORAL | 1 refills | Status: DC

## 2023-06-19 NOTE — Progress Notes (Signed)
 BP (!) 172/90   Pulse 65   Ht 5' 8 (1.727 m)   Wt 264 lb (119.7 kg)   SpO2 97%   BMI 40.14 kg/m    Subjective:    Patient ID: Robert Griffith, male    DOB: 1955-11-20, 68 y.o.   MRN: [id number]  HPI: Robert Griffith is a 68 y.o. male  Chief Complaint  Patient presents with   Diabetes   Hypertension   DIABETES Hypoglycemic episodes:no Polydipsia/polyuria: no Visual disturbance: no Chest pain: no Paresthesias: no Glucose Monitoring: no  Accucheck frequency: Not Checking Taking Insulin?: no Blood Pressure Monitoring: not checking Retinal Examination: Not up to Date Foot Exam: Up to Date Diabetic Education: Completed Pneumovax: Up to Date Influenza: Up to Date Aspirin: yes  HYPERTENSION  Hypertension status: uncontrolled  Satisfied with current treatment? no Duration of hypertension: chronic BP monitoring frequency:  not checking BP medication side effects:  no Medication compliance: excellent compliance Previous BP meds: losartan  Aspirin: yes Recurrent headaches: no Visual changes: no Palpitations: no Dyspnea: yes Chest pain: no Lower extremity edema: no Dizzy/lightheaded: no   Relevant past medical, surgical, family and social history reviewed and updated as indicated. Interim medical history since our last visit reviewed. Allergies and medications reviewed and updated.  Review of Systems  Constitutional: Negative.   Respiratory:  Positive for shortness of breath. Negative for apnea, cough, choking, chest tightness, wheezing and stridor.   Cardiovascular: Negative.   Musculoskeletal: Negative.   Neurological: Negative.   Psychiatric/Behavioral: Negative.      Per HPI unless specifically indicated above     Objective:    BP (!) 172/90   Pulse 65   Ht 5' 8 (1.727 m)   Wt 264 lb (119.7 kg)   SpO2 97%   BMI 40.14 kg/m   Wt Readings from Last 3 Encounters:  06/19/23 264 lb (119.7 kg)  04/16/23 262 lb (118.8 kg)  04/03/23 262 lb 3.2 oz  (118.9 kg)    Physical Exam Vitals and nursing note reviewed.  Constitutional:      General: He is not in acute distress.    Appearance: Normal appearance. He is obese. He is not ill-appearing, toxic-appearing or diaphoretic.  HENT:     Head: Normocephalic and atraumatic.     Right Ear: External ear normal.     Left Ear: External ear normal.     Nose: Nose normal.     Mouth/Throat:     Mouth: Mucous membranes are moist.     Pharynx: Oropharynx is clear.   Eyes:     General: No scleral icterus.       Right eye: No discharge.        Left eye: No discharge.     Extraocular Movements: Extraocular movements intact.     Conjunctiva/sclera: Conjunctivae normal.     Pupils: Pupils are equal, round, and reactive to light.    Cardiovascular:     Rate and Rhythm: Normal rate and regular rhythm.     Pulses: Normal pulses.     Heart sounds: Normal heart sounds. No murmur heard.    No friction rub. No gallop.  Pulmonary:     Effort: Pulmonary effort is normal. No respiratory distress.     Breath sounds: Normal breath sounds. No stridor. No wheezing, rhonchi or rales.  Chest:     Chest wall: No tenderness.   Musculoskeletal:        General: Normal range of motion.     Cervical  back: Normal range of motion and neck supple.   Skin:    General: Skin is warm and dry.     Capillary Refill: Capillary refill takes less than 2 seconds.     Coloration: Skin is not jaundiced or pale.     Findings: No bruising, erythema, lesion or rash.   Neurological:     General: No focal deficit present.     Mental Status: He is alert and oriented to person, place, and time. Mental status is at baseline.   Psychiatric:        Mood and Affect: Mood normal.        Behavior: Behavior normal.        Thought Content: Thought content normal.        Judgment: Judgment normal.     Results for orders placed or performed in visit on 04/16/23  Surgical pathology   Collection Time: 04/16/23 10:08 AM  Result  Value Ref Range   SURGICAL PATHOLOGY      SURGICAL PATHOLOGY CASE: [id number] PATIENT: Oney Matteucci Surgical Pathology Report     Clinical History: neoplasm of uncertain behavior of skin (cm)     FINAL MICROSCOPIC DIAGNOSIS:  A. SKIN, LEFT EPIGASTRIC, EXCISION: Seborrheic keratosis.   GROSS DESCRIPTION:  Received in formalin is a 1.4 x 1.0 cm skin shave specimen with a thickness of 0.1 cm.  The resection margin is inked blue.  The cutaneous surface is tan and crusted, without a grossly distinct lesion.  The specimen is serially sectioned revealing an underlying tan cut surface. The specimen is entirely submitted in 1 block. (KW, 04/18/2023)  Final Diagnosis performed by Ramey Spencer, MD.   Electronically signed 04/19/2023 Technical and / or Professional components performed at [facility]. [facility], [address], [place], [place] [address].  Immunohistochemistry Technical component (if applicable) was performed at [facility]. [address], [place], [place] [address].   IMMUNOHISTOCHEMISTRY DISCLAIMER (if applicable): Some of these immunohistochemical stains may have been developed and the performance characteristics determine by [facility]. Some may not have been cleared or approved by the U.S. Food and Drug Administration. The FDA has determined that such clearance or approval is not necessary. This test is used for clinical purposes. It should not be regarded as investigational or for research. This laboratory is certified under the Clinical Laboratory Improvement Amendments of 1988 (CLIA-88) as qualified to perform high complexity clinical laboratory testing.  The controls stained appropriately.   IHC stains are performed on formalin fixed, paraffin embedded tissue using a 3,3diaminobenzidine (DAB) chromogen and Leica Bond Autostainer System. The staining intensity of the nucleus is score manually and is  reported as the percentage of tumor cell nuclei demonstrating specific nuclear staining. The spe cimens are fixed in 10% Neutral Formalin for at least 6 hours and up to 72hrs. These tests are validated on decalcified tissue. Results should be interpreted with caution given the possibility of false negative results on decalcified specimens. Antibody Clones are as follows ER-clone [id number], PR-clone 16, Ki67- clone MM1. Some of these immunohistochemical stains may have been developed and the performance characteristics determined by [facility].       Assessment & Plan:   Problem List Items Addressed This Visit       Endocrine   Type 2 diabetes mellitus with stage 1 chronic kidney disease, without long-term current use of insulin (HCC) - Primary   Rechecking labs today. Await results. Treat as needed.  Relevant Medications   losartan  (COZAAR ) 100 MG tablet   metFORMIN  (GLUCOPHAGE ) 500 MG tablet   Semaglutide  (RYBELSUS ) 7 MG TABS   Other Relevant Orders   Bayer DCA Hb A1c Waived     Genitourinary   Benign hypertensive renal disease   BP still running high. Will add in amlodipine. Continue losartan . Recheck 3 months. Call with any concerns.        Follow up plan: Return in about 3 months (around 09/19/2023).

## 2023-06-19 NOTE — Assessment & Plan Note (Signed)
 BP still running high. Will add in amlodipine. Continue losartan . Recheck 3 months. Call with any concerns.

## 2023-06-19 NOTE — Assessment & Plan Note (Signed)
 Rechecking labs today. Await results. Treat as needed.

## 2023-06-21 ENCOUNTER — Other Ambulatory Visit: Payer: Self-pay | Admitting: Family Medicine

## 2023-06-24 NOTE — Telephone Encounter (Signed)
 Requested Prescriptions  Refused Prescriptions Disp Refills   amLODipine (NORVASC) 5 MG tablet [Pharmacy Med Name: AMLODIPINE BESYLATE 5 MG TAB] 90 tablet 0    Sig: TAKE 1 TABLET (5 MG TOTAL) BY MOUTH DAILY.     Cardiovascular: Calcium  Channel Blockers 2 Failed - 06/24/2023  3:44 PM      Failed - Last BP in normal range    BP Readings from Last 1 Encounters:  06/19/23 (!) 172/90         Passed - Last Heart Rate in normal range    Pulse Readings from Last 1 Encounters:  06/19/23 65         Passed - Valid encounter within last 6 months    Recent Outpatient Visits           5 days ago Type 2 diabetes mellitus with stage 1 chronic kidney disease, without long-term current use of insulin (HCC)   [place] [facility] [place], Megan P, DO   2 months ago Neoplasm of uncertain behavior of skin   [facility] [place], [place], DO   2 months ago Wheezing   [facility] Melvin Pao, NP   3 months ago Wheezing   [place] [facility] Melvin Pao, NP   3 months ago Routine general medical examination at a health care facility   [facility], Megan P, DO

## 2023-08-18 ENCOUNTER — Other Ambulatory Visit: Payer: Self-pay | Admitting: Family Medicine

## 2023-08-20 NOTE — Telephone Encounter (Signed)
 Requested Prescriptions  Pending Prescriptions Disp Refills   baclofen  (LIORESAL ) 10 MG tablet [Pharmacy Med Name: BACLOFEN  10 MG TABLET] 60 tablet 1    Sig: TAKE 1/2 TO 1 TABLET BY MOUTH EVERY DAY AT BEDTIME AS NEEDED FOR MUSCLE SPASM     Analgesics:  Muscle Relaxants - baclofen  Passed - 08/20/2023  4:51 PM      Passed - Cr in normal range and within 180 days    Creatinine  Date Value Ref Range Status  09/18/2012 0.78 0.60 - 1.30 mg/dL Final   Creatinine, Ser  Date Value Ref Range Status  03/12/2023 0.84 0.76 - 1.27 mg/dL Final         Passed - eGFR is 30 or above and within 180 days    EGFR (African American)  Date Value Ref Range Status  09/18/2012 >60  Final   GFR calc Af Amer  Date Value Ref Range Status  11/30/2019 107 >59 mL/min/1.73 Final    Comment:    **In accordance with recommendations from the NKF-ASN Task force,**   [facility] is in the process of updating its eGFR calculation to the   2021 CKD-EPI creatinine equation that estimates kidney function   without a race variable.    EGFR (Non-African Amer.)  Date Value Ref Range Status  09/18/2012 >60  Final    Comment:    eGFR values <69mL/min/1.73 m2 may be an indication of chronic kidney disease (CKD). Calculated eGFR is useful in patients with stable renal function. The eGFR calculation will not be reliable in acutely ill patients when serum creatinine is changing rapidly. It is not useful in  patients on dialysis. The eGFR calculation may not be applicable to patients at the low and high extremes of body sizes, pregnant women, and vegetarians.    GFR, Estimated  Date Value Ref Range Status  11/11/2021 >60 >60 mL/min Final    Comment:    (NOTE) Calculated using the CKD-EPI Creatinine Equation (2021)    eGFR  Date Value Ref Range Status  03/12/2023 94 >59 mL/min/1.73 Final         Passed - Valid encounter within last 6 months    Recent Outpatient Visits           2 months ago Type 2 diabetes  mellitus with stage 1 chronic kidney disease, without long-term current use of insulin (HCC)   [place] [place], Megan P, DO   4 months ago Neoplasm of uncertain behavior of skin   [facility] [facility] [place], [place], DO   4 months ago Wheezing   [facility] Melvin Pao, NP   5 months ago Wheezing   [place] [facility] Melvin Pao, NP   5 months ago Routine general medical examination at a health care facility   [facility], Megan P, DO

## 2023-08-24 ENCOUNTER — Other Ambulatory Visit: Payer: Self-pay | Admitting: Family Medicine

## 2023-08-26 NOTE — Telephone Encounter (Signed)
 Requested Prescriptions  Pending Prescriptions Disp Refills   meloxicam  (MOBIC ) 15 MG tablet [Pharmacy Med Name: MELOXICAM  15 MG TABLET] 90 tablet 1    Sig: TAKE 1 TABLET (15 MG TOTAL) BY MOUTH DAILY.     Analgesics:  COX2 Inhibitors Failed - 08/26/2023  2:44 PM      Failed - Manual Review: Labs are only required if the patient has taken medication for more than 8 weeks.      Passed - HGB in normal range and within 360 days    Hemoglobin  Date Value Ref Range Status  03/12/2023 15.2 13.0 - 17.7 g/dL Final         Passed - Cr in normal range and within 360 days    Creatinine  Date Value Ref Range Status  09/18/2012 0.78 0.60 - 1.30 mg/dL Final   Creatinine, Ser  Date Value Ref Range Status  03/12/2023 0.84 0.76 - 1.27 mg/dL Final         Passed - HCT in normal range and within 360 days    Hematocrit  Date Value Ref Range Status  03/12/2023 44.9 37.5 - 51.0 % Final         Passed - AST in normal range and within 360 days    AST  Date Value Ref Range Status  03/12/2023 31 0 - 40 IU/L Final   SGOT(AST)  Date Value Ref Range Status  09/15/2012 36 15 - 37 Unit/L Final   AST (SGOT) Piccolo, Waived  Date Value Ref Range Status  09/04/2018 38 11 - 38 U/L Final         Passed - ALT in normal range and within 360 days    ALT  Date Value Ref Range Status  03/12/2023 35 0 - 44 IU/L Final   SGPT (ALT)  Date Value Ref Range Status  09/15/2012 37 12 - 78 U/L Final   ALT (SGPT) Piccolo, Waived  Date Value Ref Range Status  09/04/2018 40 10 - 47 U/L Final         Passed - eGFR is 30 or above and within 360 days    EGFR (African American)  Date Value Ref Range Status  09/18/2012 >60  Final   GFR calc Af Amer  Date Value Ref Range Status  11/30/2019 107 >59 mL/min/1.73 Final    Comment:    **In accordance with recommendations from the NKF-ASN Task force,**   [facility] is in the process of updating its eGFR calculation to the   2021 CKD-EPI creatinine equation that  estimates kidney function   without a race variable.    EGFR (Non-African Amer.)  Date Value Ref Range Status  09/18/2012 >60  Final    Comment:    eGFR values <2mL/min/1.73 m2 may be an indication of chronic kidney disease (CKD). Calculated eGFR is useful in patients with stable renal function. The eGFR calculation will not be reliable in acutely ill patients when serum creatinine is changing rapidly. It is not useful in  patients on dialysis. The eGFR calculation may not be applicable to patients at the low and high extremes of body sizes, pregnant women, and vegetarians.    GFR, Estimated  Date Value Ref Range Status  11/11/2021 >60 >60 mL/min Final    Comment:    (NOTE) Calculated using the CKD-EPI Creatinine Equation (2021)    eGFR  Date Value Ref Range Status  03/12/2023 94 >59 mL/min/1.73 Final         Passed - Patient  is not pregnant      Passed - Valid encounter within last 12 months    Recent Outpatient Visits           2 months ago Type 2 diabetes mellitus with stage 1 chronic kidney disease, without long-term current use of insulin (HCC)   [facility] [place], Megan P, DO   4 months ago Neoplasm of uncertain behavior of skin   [place] [facility] [place], [place], DO   4 months ago Wheezing   [place] [facility] Melvin Pao, NP   5 months ago Wheezing   [place] [facility] Melvin Pao, NP   5 months ago Routine general medical examination at a health care facility   [facility], Megan P, DO

## 2023-09-16 ENCOUNTER — Other Ambulatory Visit: Payer: Self-pay | Admitting: Family Medicine

## 2023-09-17 NOTE — Telephone Encounter (Signed)
 Requested Prescriptions  Pending Prescriptions Disp Refills   amLODipine  (NORVASC ) 5 MG tablet [Pharmacy Med Name: AMLODIPINE  BESYLATE 5 MG TAB] 90 tablet 0    Sig: TAKE 1 TABLET (5 MG TOTAL) BY MOUTH DAILY.     Cardiovascular: Calcium  Channel Blockers 2 Failed - 09/17/2023  2:04 PM      Failed - Last BP in normal range    BP Readings from Last 1 Encounters:  06/19/23 (!) 172/90         Passed - Last Heart Rate in normal range    Pulse Readings from Last 1 Encounters:  06/19/23 65         Passed - Valid encounter within last 6 months    Recent Outpatient Visits           3 months ago Type 2 diabetes mellitus with stage 1 chronic kidney disease, without long-term current use of insulin (HCC)   [facility] [place], Megan P, DO   5 months ago Neoplasm of uncertain behavior of skin   [place] [facility] [place], [place], DO   5 months ago Wheezing   [place] [facility] Melvin Pao, NP   6 months ago Wheezing   [place] [facility] Melvin Pao, NP   6 months ago Routine general medical examination at a health care facility   [facility], Megan P, DO

## 2023-09-26 ENCOUNTER — Ambulatory Visit: Admitting: Family Medicine

## 2023-09-27 IMAGING — CR DG CHEST 2V
1 series · 2 of 2 positions shown · non-contrast
Comparison: None.

CLINICAL DATA: Shortness of breath

EXAM:
CHEST - 2 VIEW

[Series 1: dg chest 2 view · 0.14mm/px · 2 of 2 slices shown]
[im 1/2]
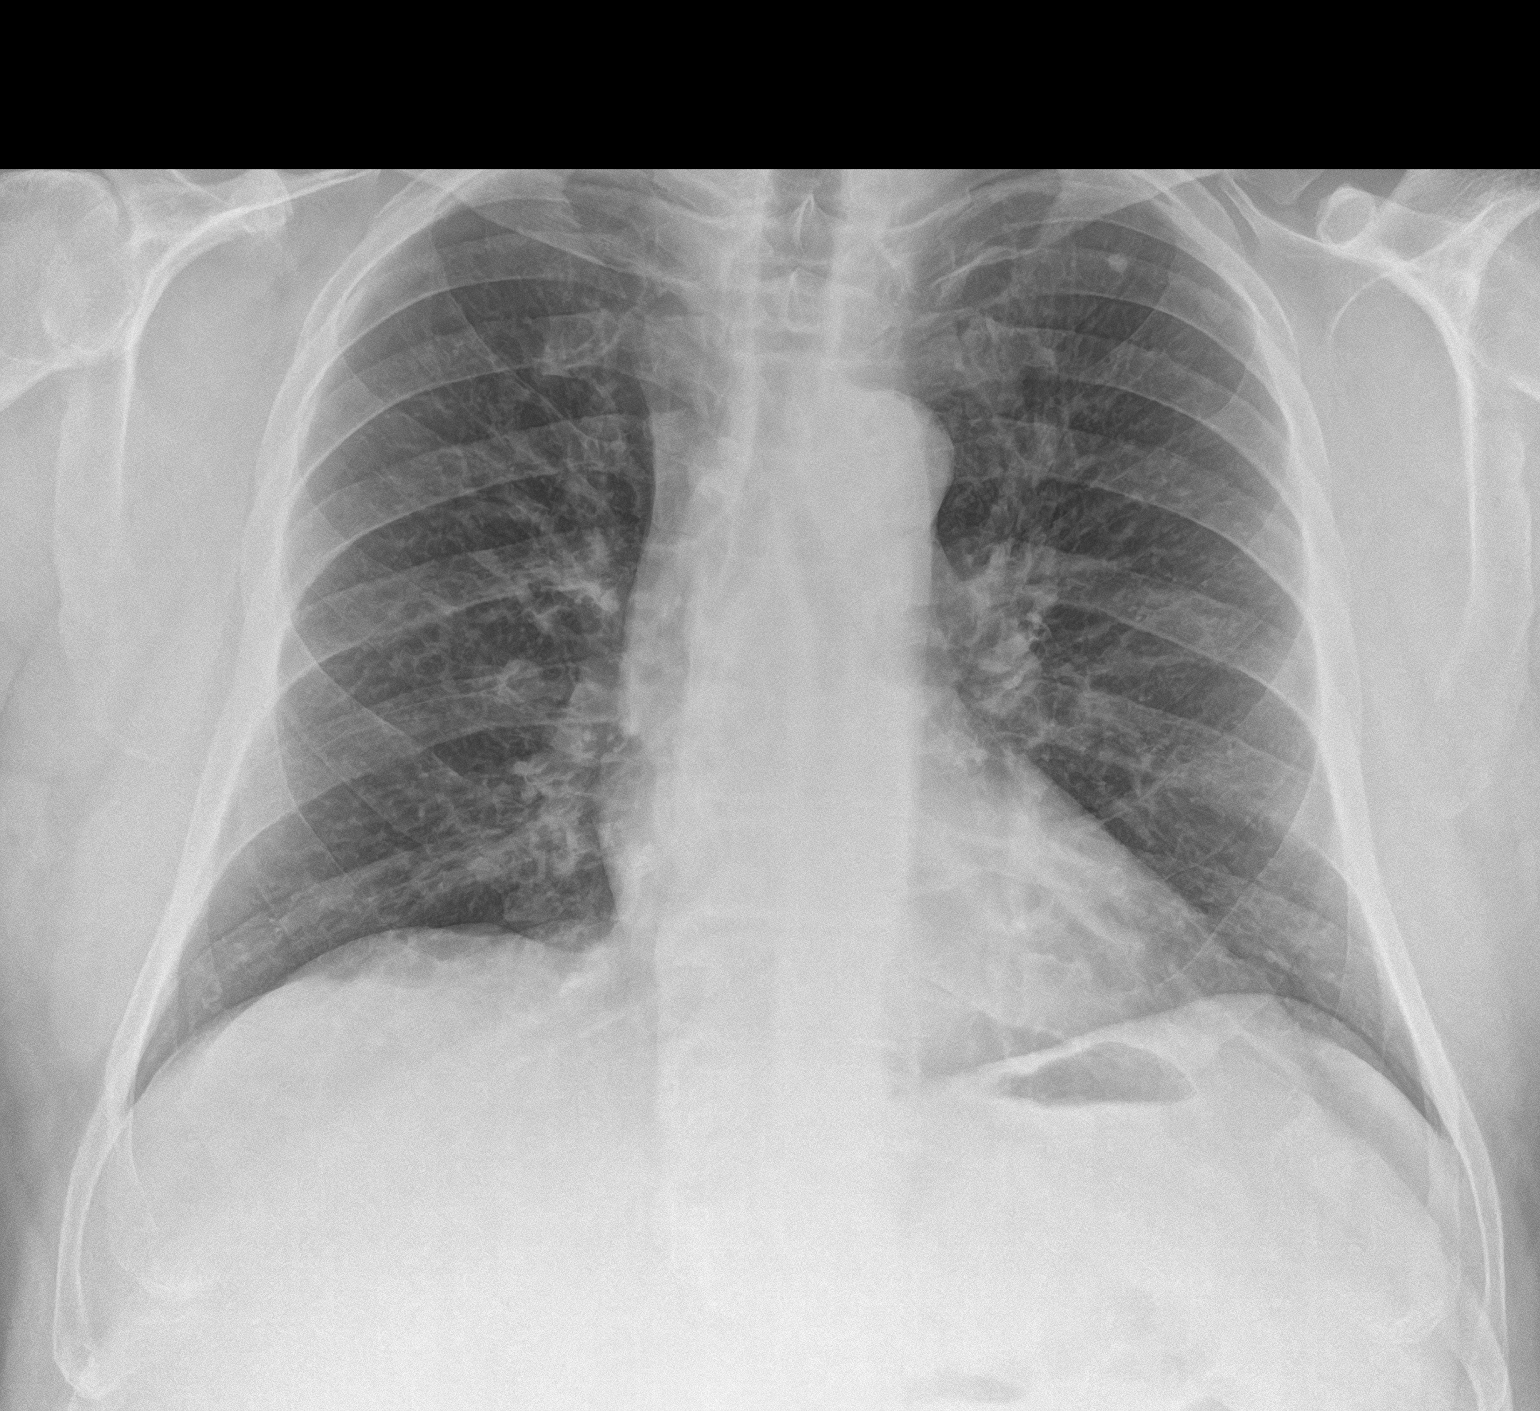
[im 2/2]
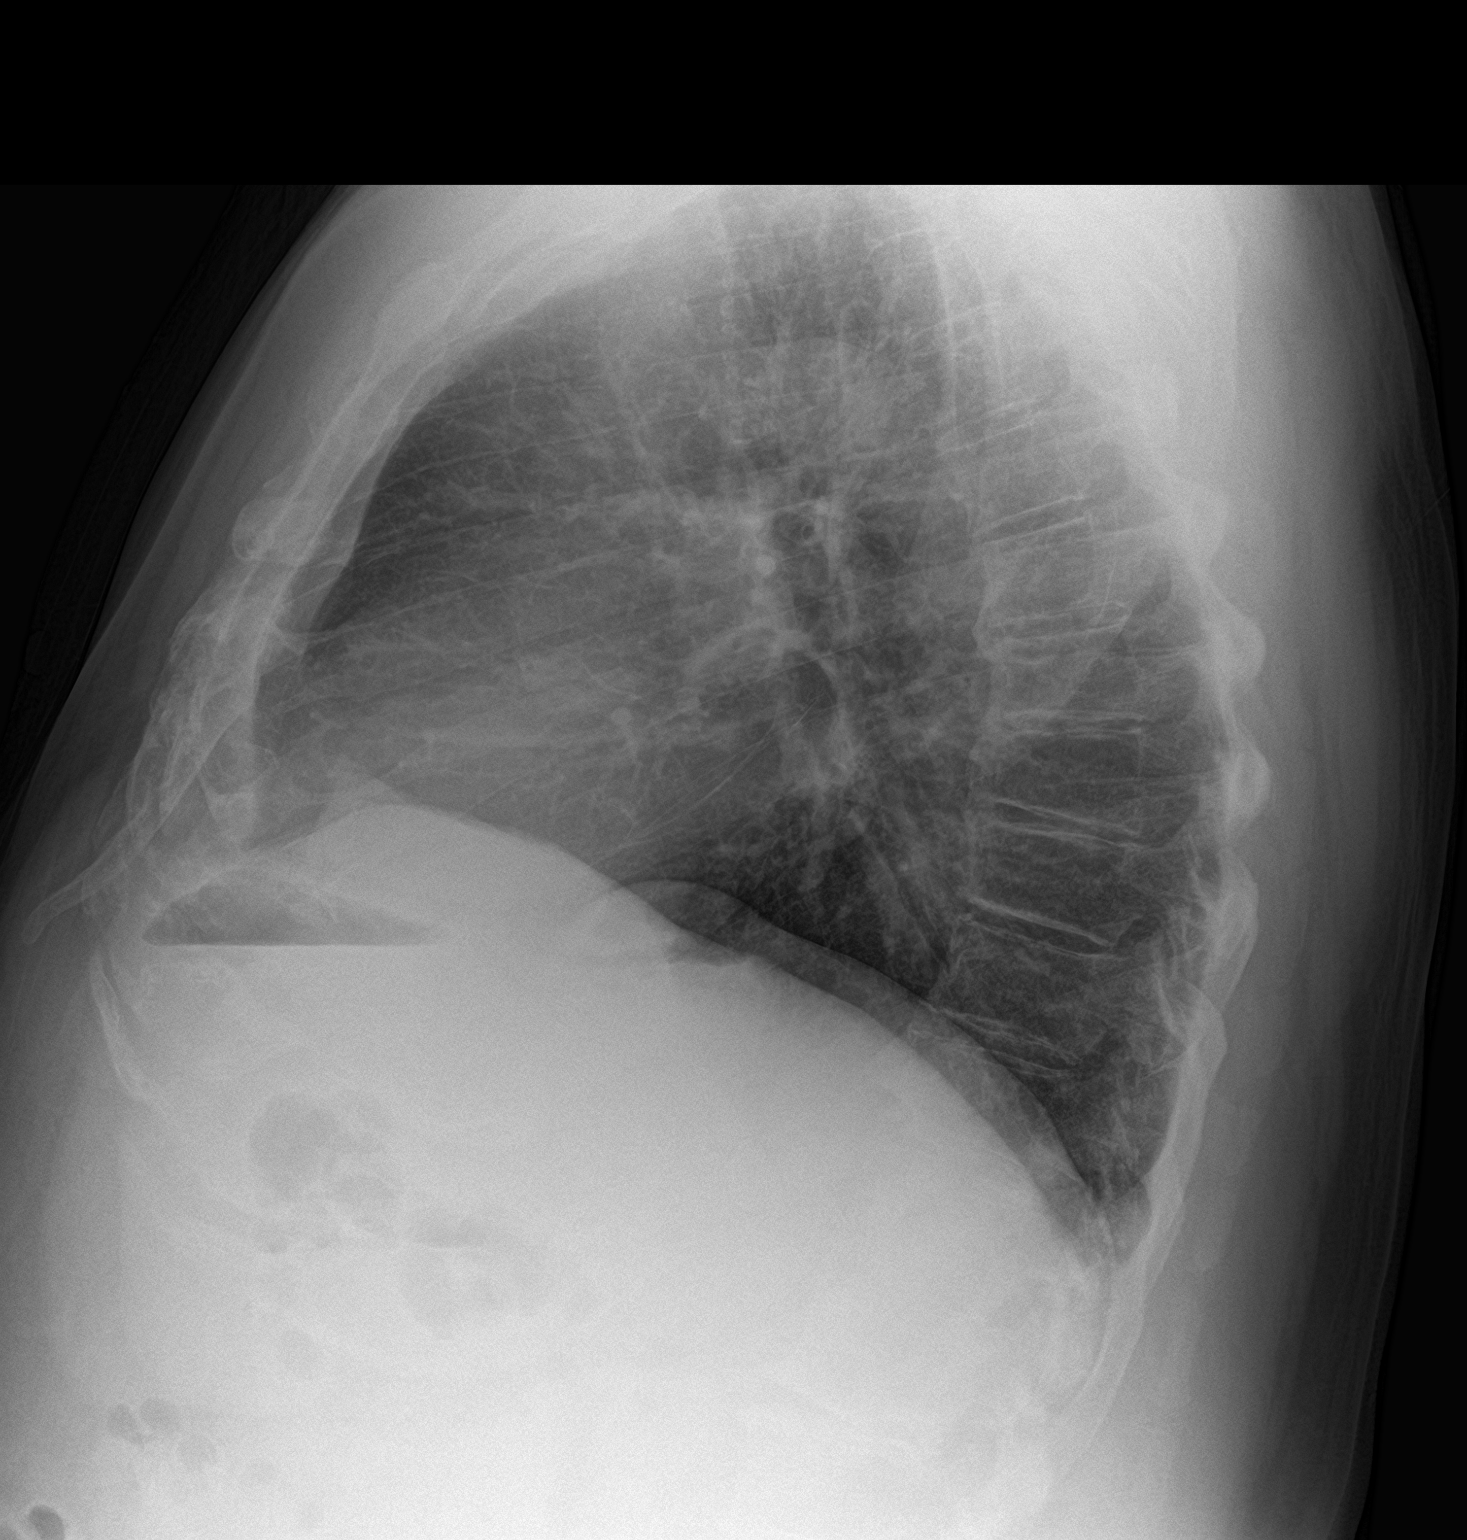

[2 of 2 positions shown; findings below may reference images not displayed]

FINDINGS: Mild hyperinflation. Midline trachea. Normal heart size and
mediastinal contours. No pleural effusion or pneumothorax. Left
upper lobe calcified granuloma of less than a cm. No lobar
consolidation.
IMPRESSION: Hyperinflation, without acute disease.

## 2023-10-03 ENCOUNTER — Ambulatory Visit (INDEPENDENT_AMBULATORY_CARE_PROVIDER_SITE_OTHER): Admitting: Family Medicine

## 2023-10-03 ENCOUNTER — Encounter: Payer: Self-pay | Admitting: Family Medicine

## 2023-10-03 VITALS — BP 116/76 | HR 73 | Temp 98.3°F | Ht 68.0 in | Wt 261.6 lb

## 2023-10-03 DIAGNOSIS — E1122 Type 2 diabetes mellitus with diabetic chronic kidney disease: Secondary | ICD-10-CM

## 2023-10-03 DIAGNOSIS — I129 Hypertensive chronic kidney disease with stage 1 through stage 4 chronic kidney disease, or unspecified chronic kidney disease: Secondary | ICD-10-CM

## 2023-10-03 DIAGNOSIS — Z23 Encounter for immunization: Secondary | ICD-10-CM | POA: Diagnosis not present

## 2023-10-03 DIAGNOSIS — E7800 Pure hypercholesterolemia, unspecified: Secondary | ICD-10-CM

## 2023-10-03 DIAGNOSIS — N181 Chronic kidney disease, stage 1: Secondary | ICD-10-CM | POA: Diagnosis not present

## 2023-10-03 LAB — BAYER DCA HB A1C WAIVED: HB A1C (BAYER DCA - WAIVED): 7 % — ABNORMAL HIGH (ref 4.8–5.6)

## 2023-10-03 MED ORDER — SEMAGLUTIDE (1 MG/DOSE) 4 MG/3ML ~~LOC~~ SOPN: 1.0000 mg | PEN_INJECTOR | SUBCUTANEOUS | 0 refills | Status: DC

## 2023-10-03 MED ORDER — METFORMIN HCL 500 MG PO TABS: 1000.0000 mg | ORAL_TABLET | Freq: Two times a day (BID) | ORAL | 1 refills | Status: AC

## 2023-10-03 MED ORDER — MELOXICAM 15 MG PO TABS: 15.0000 mg | ORAL_TABLET | Freq: Every day | ORAL | 1 refills | Status: AC

## 2023-10-03 MED ORDER — LOSARTAN POTASSIUM 100 MG PO TABS: 100.0000 mg | ORAL_TABLET | Freq: Every day | ORAL | 1 refills | Status: AC

## 2023-10-03 MED ORDER — AMLODIPINE BESYLATE 5 MG PO TABS: 5.0000 mg | ORAL_TABLET | Freq: Every day | ORAL | 1 refills | Status: AC

## 2023-10-03 MED ORDER — OZEMPIC (0.25 OR 0.5 MG/DOSE) 2 MG/3ML ~~LOC~~ SOPN: 0.5000 mg | PEN_INJECTOR | SUBCUTANEOUS | Status: DC

## 2023-10-03 MED ORDER — ROSUVASTATIN CALCIUM 40 MG PO TABS: 40.0000 mg | ORAL_TABLET | Freq: Every day | ORAL | 1 refills | Status: AC

## 2023-10-03 MED ORDER — MONTELUKAST SODIUM 10 MG PO TABS: 10.0000 mg | ORAL_TABLET | Freq: Every day | ORAL | 1 refills | Status: AC

## 2023-10-03 NOTE — Assessment & Plan Note (Addendum)
 Doing well with A1c of 7.0 down from 7.1. Would like to change from rybelsus  to ozempic for increased weight loss. Will switch over. Call with any concerns. Recheck 3 months.

## 2023-10-03 NOTE — Progress Notes (Signed)
 BP 116/76   Pulse 73   Temp 98.3 F (36.8 C) (Oral)   Ht 5' 8 (1.727 m)   Wt 261 lb 9.6 oz (118.7 kg)   SpO2 95%   BMI 39.78 kg/m    Subjective:    Patient ID: Robert Griffith, male    DOB: November 29, 1955, 68 y.o.   MRN: [id number]  HPI: Robert Griffith is a 68 y.o. male  Chief Complaint  Patient presents with   Diabetes   DIABETES Hypoglycemic episodes:no Polydipsia/polyuria: no Visual disturbance: no Chest pain: no Paresthesias: no Glucose Monitoring: no Taking Insulin?: no Blood Pressure Monitoring: not checking Retinal Examination: Not up to Date Foot Exam: Up to Date Diabetic Education: Completed Pneumovax: Up to Date Influenza: Up to Date Aspirin: yes  HYPERTENSION / HYPERLIPIDEMIA Satisfied with current treatment? yes Duration of hypertension: chronic BP monitoring frequency: not checking BP medication side effects: no Past BP meds: amlodipine , losartan  Duration of hyperlipidemia: chronic Cholesterol medication side effects: no Cholesterol supplements: none Past cholesterol medications: crestor  Medication compliance: excellent compliance Aspirin: yes Recent stressors: no Recurrent headaches: no Visual changes: no Palpitations: no Dyspnea: no Chest pain: no Lower extremity edema: no Dizzy/lightheaded: no   Relevant past medical, surgical, family and social history reviewed and updated as indicated. Interim medical history since our last visit reviewed. Allergies and medications reviewed and updated.  Review of Systems  Constitutional: Negative.   Respiratory: Negative.    Cardiovascular: Negative.   Musculoskeletal: Negative.   Neurological: Negative.   Psychiatric/Behavioral: Negative.      Per HPI unless specifically indicated above     Objective:    BP 116/76   Pulse 73   Temp 98.3 F (36.8 C) (Oral)   Ht 5' 8 (1.727 m)   Wt 261 lb 9.6 oz (118.7 kg)   SpO2 95%   BMI 39.78 kg/m   Wt Readings from Last 3 Encounters:   10/03/23 261 lb 9.6 oz (118.7 kg)  06/19/23 264 lb (119.7 kg)  04/16/23 262 lb (118.8 kg)    Physical Exam Vitals and nursing note reviewed.  Constitutional:      General: He is not in acute distress.    Appearance: Normal appearance. He is obese. He is not ill-appearing, toxic-appearing or diaphoretic.  HENT:     Head: Normocephalic and atraumatic.     Right Ear: External ear normal.     Left Ear: External ear normal.     Nose: Nose normal.     Mouth/Throat:     Mouth: Mucous membranes are moist.     Pharynx: Oropharynx is clear.  Eyes:     General: No scleral icterus.       Right eye: No discharge.        Left eye: No discharge.     Extraocular Movements: Extraocular movements intact.     Conjunctiva/sclera: Conjunctivae normal.     Pupils: Pupils are equal, round, and reactive to light.  Cardiovascular:     Rate and Rhythm: Normal rate and regular rhythm.     Pulses: Normal pulses.     Heart sounds: Normal heart sounds. No murmur heard.    No friction rub. No gallop.  Pulmonary:     Effort: Pulmonary effort is normal. No respiratory distress.     Breath sounds: Normal breath sounds. No stridor. No wheezing, rhonchi or rales.  Chest:     Chest wall: No tenderness.  Musculoskeletal:        General: Normal range  of motion.     Cervical back: Normal range of motion and neck supple.  Skin:    General: Skin is warm and dry.     Capillary Refill: Capillary refill takes less than 2 seconds.     Coloration: Skin is not jaundiced or pale.     Findings: No bruising, erythema, lesion or rash.  Neurological:     General: No focal deficit present.     Mental Status: He is alert and oriented to person, place, and time. Mental status is at baseline.  Psychiatric:        Mood and Affect: Mood normal.        Behavior: Behavior normal.        Thought Content: Thought content normal.        Judgment: Judgment normal.     Results for orders placed or performed in visit on  06/19/23  Bayer DCA Hb A1c Waived   Collection Time: 06/19/23  8:37 AM  Result Value Ref Range   HB A1C (BAYER DCA - WAIVED) 7.1 (H) 4.8 - 5.6 %      Assessment & Plan:   Problem List Items Addressed This Visit       Endocrine   Type 2 diabetes mellitus with stage 1 chronic kidney disease, without long-term current use of insulin (HCC) - Primary   Doing well with A1c of 7.0 down from 7.1. Would like to change from rybelsus  to ozempic for increased weight loss. Will switch over. Call with any concerns. Recheck 3 months.       Relevant Medications   losartan  (COZAAR ) 100 MG tablet   metFORMIN  (GLUCOPHAGE ) 500 MG tablet   rosuvastatin  (CRESTOR ) 40 MG tablet   Semaglutide ,0.25 or 0.5MG /DOS, (OZEMPIC, 0.25 OR 0.5 MG/DOSE,) 2 MG/3ML SOPN   Semaglutide , 1 MG/DOSE, 4 MG/3ML SOPN (Start on 10/31/2023)   Other Relevant Orders   Bayer DCA Hb A1c Waived   Comprehensive metabolic panel with GFR   CBC with Differential/Platelet     Genitourinary   Benign hypertensive renal disease   Under good control on current regimen. Continue current regimen. Continue to monitor. Call with any concerns. Refills given. Labs drawn today.        Relevant Orders   Comprehensive metabolic panel with GFR   CBC with Differential/Platelet     Other   Hypercholesteremia   Under good control on current regimen. Continue current regimen. Continue to monitor. Call with any concerns. Refills given. Labs drawn today.        Relevant Medications   amLODipine  (NORVASC ) 5 MG tablet   losartan  (COZAAR ) 100 MG tablet   rosuvastatin  (CRESTOR ) 40 MG tablet   Other Relevant Orders   Comprehensive metabolic panel with GFR   CBC with Differential/Platelet   Lipid Panel w/o Chol/HDL Ratio   Other Visit Diagnoses       Needs flu shot       Flu shot given today.   Relevant Orders   Flu vaccine HIGH DOSE PF(Fluzone Trivalent) (Completed)        Follow up plan: Return in about 3 months (around  01/03/2024).

## 2023-10-03 NOTE — Assessment & Plan Note (Signed)
 Under good control on current regimen. Continue current regimen. Continue to monitor. Call with any concerns. Refills given. Labs drawn today.

## 2023-10-04 ENCOUNTER — Ambulatory Visit: Payer: Self-pay | Admitting: Family Medicine

## 2023-10-04 LAB — CBC WITH DIFFERENTIAL/PLATELET
Basophils Absolute: 0 x10E3/uL (ref 0.0–0.2)
Basos: 1 %
EOS (ABSOLUTE): 0.3 x10E3/uL (ref 0.0–0.4)
Eos: 5 %
Hematocrit: 43.6 % (ref 37.5–51.0)
Hemoglobin: 14.2 g/dL (ref 13.0–17.7)
Immature Grans (Abs): 0 x10E3/uL (ref 0.0–0.1)
Immature Granulocytes: 0 %
Lymphocytes Absolute: 2.6 x10E3/uL (ref 0.7–3.1)
Lymphs: 35 %
MCH: 29.9 pg (ref 26.6–33.0)
MCHC: 32.6 g/dL (ref 31.5–35.7)
MCV: 92 fL (ref 79–97)
Monocytes Absolute: 0.6 x10E3/uL (ref 0.1–0.9)
Monocytes: 9 %
Neutrophils Absolute: 3.8 x10E3/uL (ref 1.4–7.0)
Neutrophils: 50 %
Platelets: 180 x10E3/uL (ref 150–450)
RBC: 4.75 x10E6/uL (ref 4.14–5.80)
RDW: 13.8 % (ref 11.6–15.4)
WBC: 7.4 x10E3/uL (ref 3.4–10.8)

## 2023-10-04 LAB — COMPREHENSIVE METABOLIC PANEL WITH GFR
ALT: 36 IU/L (ref 0–44)
AST: 34 IU/L (ref 0–40)
Albumin: 4.4 g/dL (ref 3.9–4.9)
Alkaline Phosphatase: 64 IU/L (ref 47–123)
BUN/Creatinine Ratio: 15 (ref 10–24)
BUN: 13 mg/dL (ref 8–27)
Bilirubin Total: 0.5 mg/dL (ref 0.0–1.2)
CO2: 23 mmol/L (ref 20–29)
Calcium: 9.2 mg/dL (ref 8.6–10.2)
Chloride: 100 mmol/L (ref 96–106)
Creatinine, Ser: 0.88 mg/dL (ref 0.76–1.27)
Globulin, Total: 2.3 g/dL (ref 1.5–4.5)
Glucose: 149 mg/dL — ABNORMAL HIGH (ref 70–99)
Potassium: 4.3 mmol/L (ref 3.5–5.2)
Sodium: 136 mmol/L (ref 134–144)
Total Protein: 6.7 g/dL (ref 6.0–8.5)
eGFR: 93 mL/min/1.73 (ref >59)

## 2023-10-04 LAB — LIPID PANEL W/O CHOL/HDL RATIO
Cholesterol, Total: 111 mg/dL (ref 100–199)
HDL: 38 mg/dL — ABNORMAL LOW (ref >39)
LDL Chol Calc (NIH): 34 mg/dL (ref 0–99)
Triglycerides: 257 mg/dL — ABNORMAL HIGH (ref 0–149)
VLDL Cholesterol Cal: 39 mg/dL (ref 5–40)

## 2023-10-12 ENCOUNTER — Other Ambulatory Visit: Payer: Self-pay | Admitting: Family Medicine

## 2023-10-15 NOTE — Telephone Encounter (Signed)
 Requested Prescriptions  Pending Prescriptions Disp Refills   baclofen  (LIORESAL ) 10 MG tablet [Pharmacy Med Name: BACLOFEN  10 MG TABLET] 90 tablet 0    Sig: TAKE 1/2 TO 1 TABLET BY MOUTH EVERY DAY AT BEDTIME AS NEEDED FOR MUSCLE SPASM     Analgesics:  Muscle Relaxants - baclofen  Passed - 10/15/2023 12:58 PM      Passed - Cr in normal range and within 180 days    Creatinine  Date Value Ref Range Status  09/18/2012 0.78 0.60 - 1.30 mg/dL Final   Creatinine, Ser  Date Value Ref Range Status  10/03/2023 0.88 0.76 - 1.27 mg/dL Final         Passed - eGFR is 30 or above and within 180 days    EGFR (African American)  Date Value Ref Range Status  09/18/2012 >60  Final   GFR calc Af Amer  Date Value Ref Range Status  11/30/2019 107 >59 mL/min/1.73 Final    Comment:    **In accordance with recommendations from the NKF-ASN Task force,**   [facility] is in the process of updating its eGFR calculation to the   2021 CKD-EPI creatinine equation that estimates kidney function   without a race variable.    EGFR (Non-African Amer.)  Date Value Ref Range Status  09/18/2012 >60  Final    Comment:    eGFR values <24mL/min/1.73 m2 may be an indication of chronic kidney disease (CKD). Calculated eGFR is useful in patients with stable renal function. The eGFR calculation will not be reliable in acutely ill patients when serum creatinine is changing rapidly. It is not useful in  patients on dialysis. The eGFR calculation may not be applicable to patients at the low and high extremes of body sizes, pregnant women, and vegetarians.    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

## 2023-11-04 ENCOUNTER — Ambulatory Visit: Admitting: Family Medicine

## 2023-11-19 DIAGNOSIS — H5203 Hypermetropia, bilateral: Secondary | ICD-10-CM | POA: Diagnosis not present

## 2023-11-19 DIAGNOSIS — Z7984 Long term (current) use of oral hypoglycemic drugs: Secondary | ICD-10-CM | POA: Diagnosis not present

## 2023-11-19 DIAGNOSIS — H524 Presbyopia: Secondary | ICD-10-CM | POA: Diagnosis not present

## 2023-11-19 DIAGNOSIS — H2513 Age-related nuclear cataract, bilateral: Secondary | ICD-10-CM | POA: Diagnosis not present

## 2023-11-19 DIAGNOSIS — E119 Type 2 diabetes mellitus without complications: Secondary | ICD-10-CM | POA: Diagnosis not present

## 2023-11-19 DIAGNOSIS — H52223 Regular astigmatism, bilateral: Secondary | ICD-10-CM | POA: Diagnosis not present

## 2023-11-19 LAB — OPHTHALMOLOGY REPORT-SCANNED

## 2024-01-06 ENCOUNTER — Other Ambulatory Visit: Payer: Self-pay | Admitting: Family Medicine

## 2024-01-07 NOTE — Telephone Encounter (Signed)
 Requested Prescriptions  Pending Prescriptions Disp Refills   baclofen  (LIORESAL ) 10 MG tablet [Pharmacy Med Name: BACLOFEN  10 MG TABLET] 90 tablet 0    Sig: TAKE 1/2 TO 1 TABLET BY MOUTH EVERY DAY AT BEDTIME AS NEEDED FOR MUSCLE SPASM     Analgesics:  Muscle Relaxants - baclofen  Passed - 01/07/2024  5:33 PM      Passed - Cr in normal range and within 180 days    Creatinine  Date Value Ref Range Status  09/18/2012 0.78 0.60 - 1.30 mg/dL Final   Creatinine, Ser  Date Value Ref Range Status  10/03/2023 0.88 0.76 - 1.27 mg/dL Final         Passed - eGFR is 30 or above and within 180 days    EGFR (African American)  Date Value Ref Range Status  09/18/2012 >60  Final   GFR calc Af Amer  Date Value Ref Range Status  11/30/2019 107 >59 mL/min/1.73 Final    Comment:    **In accordance with recommendations from the NKF-ASN Task force,**   [facility] is in the process of updating its eGFR calculation to the   2021 CKD-EPI creatinine equation that estimates kidney function   without a race variable.    EGFR (Non-African Amer.)  Date Value Ref Range Status  09/18/2012 >60  Final    Comment:    eGFR values <23mL/min/1.73 m2 may be an indication of chronic kidney disease (CKD). Calculated eGFR is useful in patients with stable renal function. The eGFR calculation will not be reliable in acutely ill patients when serum creatinine is changing rapidly. It is not useful in  patients on dialysis. The eGFR calculation may not be applicable to patients at the low and high extremes of body sizes, pregnant women, and vegetarians.    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

## 2024-01-14 ENCOUNTER — Ambulatory Visit: Admitting: Family Medicine

## 2024-01-14 ENCOUNTER — Encounter: Payer: Self-pay | Admitting: Family Medicine

## 2024-01-14 VITALS — BP 158/82 | HR 67 | Temp 98.0°F | Ht 68.0 in | Wt 259.4 lb

## 2024-01-14 DIAGNOSIS — N181 Chronic kidney disease, stage 1: Secondary | ICD-10-CM

## 2024-01-14 DIAGNOSIS — Z7985 Long-term (current) use of injectable non-insulin antidiabetic drugs: Secondary | ICD-10-CM

## 2024-01-14 DIAGNOSIS — I129 Hypertensive chronic kidney disease with stage 1 through stage 4 chronic kidney disease, or unspecified chronic kidney disease: Secondary | ICD-10-CM | POA: Diagnosis not present

## 2024-01-14 DIAGNOSIS — E1122 Type 2 diabetes mellitus with diabetic chronic kidney disease: Secondary | ICD-10-CM

## 2024-01-14 LAB — BAYER DCA HB A1C WAIVED: HB A1C (BAYER DCA - WAIVED): 7 % — ABNORMAL HIGH (ref 4.8–5.6)

## 2024-01-14 MED ORDER — OZEMPIC (0.25 OR 0.5 MG/DOSE) 2 MG/3ML ~~LOC~~ SOPN: PEN_INJECTOR | SUBCUTANEOUS | 1 refills | Status: AC

## 2024-01-14 NOTE — Progress Notes (Signed)
 "  BP (!) 158/82   Pulse 67   Temp 98 F (36.7 C) (Oral)   Ht 5' 8 (1.727 m)   Wt 259 lb 6.4 oz (117.7 kg)   SpO2 97%   BMI 39.44 kg/m    Subjective:    Patient ID: Robert Griffith, male    DOB: 06-15-1955, 69 y.o.   MRN: [id number]  HPI: Robert Griffith is a 69 y.o. male  Chief Complaint  Patient presents with   Diabetes   DIABETES Hypoglycemic episodes:no Polydipsia/polyuria: no Visual disturbance: no Chest pain: no Paresthesias: no Glucose Monitoring: yes  Accucheck frequency: occasionally Taking Insulin?: no Blood Pressure Monitoring: rarely Retinal Examination: Up to Date Foot Exam: Up to Date Diabetic Education: Completed Pneumovax: Up to Date Influenza: Up to Date Aspirin: yes   Relevant past medical, surgical, family and social history reviewed and updated as indicated. Interim medical history since our last visit reviewed. Allergies and medications reviewed and updated.  Review of Systems  Constitutional: Negative.   Respiratory: Negative.    Cardiovascular: Negative.   Musculoskeletal: Negative.   Neurological: Negative.   Psychiatric/Behavioral: Negative.      Per HPI unless specifically indicated above     Objective:    BP (!) 158/82   Pulse 67   Temp 98 F (36.7 C) (Oral)   Ht 5' 8 (1.727 m)   Wt 259 lb 6.4 oz (117.7 kg)   SpO2 97%   BMI 39.44 kg/m   Wt Readings from Last 3 Encounters:  01/14/24 259 lb 6.4 oz (117.7 kg)  10/03/23 261 lb 9.6 oz (118.7 kg)  06/19/23 264 lb (119.7 kg)    Physical Exam Vitals and nursing note reviewed.  Constitutional:      General: He is not in acute distress.    Appearance: Normal appearance. He is obese. He is not ill-appearing, toxic-appearing or diaphoretic.  HENT:     Head: Normocephalic and atraumatic.     Right Ear: External ear normal.     Left Ear: External ear normal.     Nose: Nose normal.     Mouth/Throat:     Mouth: Mucous membranes are moist.     Pharynx: Oropharynx is  clear.  Eyes:     General: No scleral icterus.       Right eye: No discharge.        Left eye: No discharge.     Extraocular Movements: Extraocular movements intact.     Conjunctiva/sclera: Conjunctivae normal.     Pupils: Pupils are equal, round, and reactive to light.  Cardiovascular:     Rate and Rhythm: Normal rate and regular rhythm.     Pulses: Normal pulses.     Heart sounds: Normal heart sounds. No murmur heard.    No friction rub. No gallop.  Pulmonary:     Effort: Pulmonary effort is normal. No respiratory distress.     Breath sounds: Normal breath sounds. No stridor. No wheezing, rhonchi or rales.  Chest:     Chest wall: No tenderness.  Musculoskeletal:        General: Normal range of motion.     Cervical back: Normal range of motion and neck supple.  Skin:    General: Skin is warm and dry.     Capillary Refill: Capillary refill takes less than 2 seconds.     Coloration: Skin is not jaundiced or pale.     Findings: No bruising, erythema, lesion or rash.  Neurological:  General: No focal deficit present.     Mental Status: He is alert and oriented to person, place, and time. Mental status is at baseline.  Psychiatric:        Mood and Affect: Mood normal.        Behavior: Behavior normal.        Thought Content: Thought content normal.        Judgment: Judgment normal.     Results for orders placed or performed in visit on 11/20/23  OPHTHALMOLOGY REPORT-SCANNED   Collection Time: 11/19/23 10:37 AM  Result Value Ref Range   HM Diabetic Eye Exam No Retinopathy No Retinopathy   A Comment        Assessment & Plan:   Problem List Items Addressed This Visit       Endocrine   Type 2 diabetes mellitus with stage 1 chronic kidney disease, without long-term current use of insulin (HCC) - Primary   Stable with A1c of 7.0. Was not able to get his ozempic . Will get pharmacy involved to see if they can help. Call with any concerns.       Relevant Medications    Semaglutide ,0.25 or 0.5MG /DOS, (OZEMPIC , 0.25 OR 0.5 MG/DOSE,) 2 MG/3ML SOPN   Other Relevant Orders   Bayer DCA Hb A1c Waived   AMB Referral VBCI Care Management     Genitourinary   Benign hypertensive renal disease   Running high, but about to start ozempic . Will recheck in 6 weeks to see if it goes down with weight loss, otherwise will increase his amlodipine .         Other   Morbid obesity (HCC)   Due to HTN and DM. To start ozempic  for sugars. Recheck 6 weeks.       Relevant Medications   Semaglutide ,0.25 or 0.5MG /DOS, (OZEMPIC , 0.25 OR 0.5 MG/DOSE,) 2 MG/3ML SOPN     Follow up plan: Return in about 6 weeks (around 02/25/2024).      "

## 2024-01-14 NOTE — Assessment & Plan Note (Signed)
 Stable with A1c of 7.0. Was not able to get his ozempic . Will get pharmacy involved to see if they can help. Call with any concerns.

## 2024-01-14 NOTE — Assessment & Plan Note (Signed)
 Running high, but about to start ozempic . Will recheck in 6 weeks to see if it goes down with weight loss, otherwise will increase his amlodipine .

## 2024-01-14 NOTE — Assessment & Plan Note (Signed)
 Due to HTN and DM. To start ozempic  for sugars. Recheck 6 weeks.

## 2024-01-16 ENCOUNTER — Telehealth: Payer: Self-pay

## 2024-01-16 NOTE — Progress Notes (Signed)
 Care Guide Pharmacy Note  01/16/2024 Name: [name] MRN: [id number] DOB: 21-Sep-1955  Referred By: Vicci Duwaine SQUIBB, DO Reason for referral: Complex Care Management (Outreach to schedule with Pharm d )   Robert Griffith is a 69 y.o. year old male who is a primary care patient of Vicci Duwaine SQUIBB, DO.  [name] was referred to the pharmacist for assistance related to: DMII  Successful contact was made with the patient to discuss pharmacy services including being ready for the pharmacist to call at least 5 minutes before the scheduled appointment time and to have medication bottles and any blood pressure readings ready for review. The patient agreed to meet with the pharmacist via telephone visit on (date/time).01/29/2024  Jeoffrey Buffalo , RMA     [place]  [facility] Guide  Direct Dial: [phone]  Website: [email or web address]

## 2024-01-29 ENCOUNTER — Other Ambulatory Visit: Payer: Self-pay

## 2024-01-29 DIAGNOSIS — N181 Chronic kidney disease, stage 1: Secondary | ICD-10-CM

## 2024-01-29 DIAGNOSIS — E7800 Pure hypercholesterolemia, unspecified: Secondary | ICD-10-CM

## 2024-01-29 DIAGNOSIS — E1122 Type 2 diabetes mellitus with diabetic chronic kidney disease: Secondary | ICD-10-CM

## 2024-01-29 NOTE — Progress Notes (Signed)
" ° °  01/29/2024 Name: Robert Griffith MRN: [id number] DOB: 1955-02-15  Robert Griffith is a 69 y.o. year old male who presented for a telephone visit.   They were referred to the pharmacist by their PCP for assistance in managing diabetes.   Subjective  Care Team: Primary Care Provider: Vicci Duwaine SQUIBB, DO ; Next Scheduled Visit: 02/26/24  Medication Access/Adherence  Current Pharmacy:  [facility] [address] [place] - [address] [place] [address] Phone: [phone] Fax: [phone]  Patient reports affordability concerns with their medications: Yes  Patient reports access/transportation concerns to their pharmacy: No  Patient reports adherence concerns with their medications:  Yes    Diabetes:  Current medications: Metformin  1000mg  twice daily, Ozempic  0.5mg  weekly -Medications tried in the past: Rybelsus  changed to Ozempic  for additional weight loss benefit -Patient did pick Ozempic  up 1/13, but this was $199, which will not be affordable every month -Current glucose readings: FBG 157-166 -Patient states he took 1 dose of Ozempic  0.25mg  weekly, then took 0.5mg  the next week- did not realize he was to stay at 0.25mg  weekly x 4 weeks before increasing to 0.5mg  weekly.  Does not report any adverse GI effects at this time.  Macrovascular and Microvascular Risk Reduction:  Statin? yes (rosuvastatin  40mg  daily); ACEi/ARB? yes (losartan  100mg  daily) Last urinary albumin/creatinine ratio:  Lab Results  Component Value Date   MICRALBCREAT 30-300 (H) 03/12/2023   MICRALBCREAT <30 12/12/2022   MICRALBCREAT 30-300 (H) 12/15/2021   MICRALBCREAT <30 09/07/2021   MICRALBCREAT <30 11/03/2020   MICRALBCREAT <30 02/06/2016   Last eye exam:  Lab Results  Component Value Date   HMDIABEYEEXA No Retinopathy 11/19/2023   Last foot exam: 10/03/2023 Tobacco Use:  Tobacco Use: Low Risk (01/14/2024)   Patient History    Smoking Tobacco Use: Never     Smokeless Tobacco Use: Never    Passive Exposure: Not on file   Objective Lab Results  Component Value Date   HGBA1C 7.0 (H) 01/14/2024   Lab Results  Component Value Date   CREATININE 0.88 10/03/2023   BUN 13 10/03/2023   NA 136 10/03/2023   K 4.3 10/03/2023   CL 100 10/03/2023   CO2 23 10/03/2023   Lab Results  Component Value Date   CHOL 111 10/03/2023   HDL 38 (L) 10/03/2023   LDLCALC 34 10/03/2023   TRIG 257 (H) 10/03/2023   CHOLHDL 2.9 01/20/2021   Assessment/Plan:   Diabetes: -Currently uncontrolled; goal A1c <7%. Cardiorenal risk reduction has opportunities for improvement.. Blood pressure is not at goal <130/80. LDL is at goal.  -Continue Ozempic  0.5mg  weekly -Continue to monitor and record FBG -I will contact insurance to see if future refills will be $199; hopefully this satisfied a deductible, and copay moving forward will be affordable.  If not, we can see if he may be eligible for LIS or Lilly Cares PAP for Trulicity. -Patient would also likely benefit from SGLT2 based on UACR (could also help with BP)  Follow Up Plan: Will follow-up with patient after contacting insurance  Channing DELENA Mealing, PharmD, DPLA    "

## 2024-02-03 ENCOUNTER — Telehealth: Payer: Self-pay

## 2024-02-03 NOTE — Progress Notes (Signed)
" ° °  02/03/2024  Patient ID: Robert Griffith, male   DOB: 16-Oct-1955, 69 y.o.   MRN: [id number]  Contacted patient's insurance, and Ozempic  is a Tier 3 medication.  This means there is no deductible requirement, but he is responsible for 20% of the drug cost until the max out of pocket of $2,500 is met.  Therefore, he will continue to pay $197/month at this time.  Sending MyChart message to make him aware and will schedule a telephone follow-up to discuss other options if this will not be affordable.  Robert Griffith, PharmD, DPLA  "

## 2024-02-20 ENCOUNTER — Ambulatory Visit

## 2024-02-26 ENCOUNTER — Ambulatory Visit: Admitting: Family Medicine
